# Patient Record
Sex: Female | Born: 1960 | Race: Black or African American | Hispanic: No | Smoking: Current every day smoker
Health system: Southern US, Community
[De-identification: ages and names within clinical notes are randomized; demographics above are authoritative.]

## PROBLEM LIST (undated history)

## (undated) DIAGNOSIS — I10 Essential (primary) hypertension: Secondary | ICD-10-CM

## (undated) DIAGNOSIS — I739 Peripheral vascular disease, unspecified: Secondary | ICD-10-CM

## (undated) HISTORY — PX: TUBAL LIGATION: SHX77

## (undated) HISTORY — DX: Peripheral vascular disease, unspecified: I73.9

## (undated) HISTORY — PX: APPENDECTOMY: SHX54

---

## 2006-07-29 ENCOUNTER — Emergency Department (HOSPITAL_COMMUNITY): Admission: EM | Admit: 2006-07-29 | Discharge: 2006-07-29 | Payer: Self-pay | Admitting: Emergency Medicine

## 2006-07-29 IMAGING — CR DG CHEST 2V
2 series · 2 of 2 positions shown · non-contrast
Comparison: None.

CLINICAL DATA: Body aches, loss of appetite, nausea, smoker, fever. 
 CHEST ? 2 VIEW:

[view not recorded (1 of 2)]
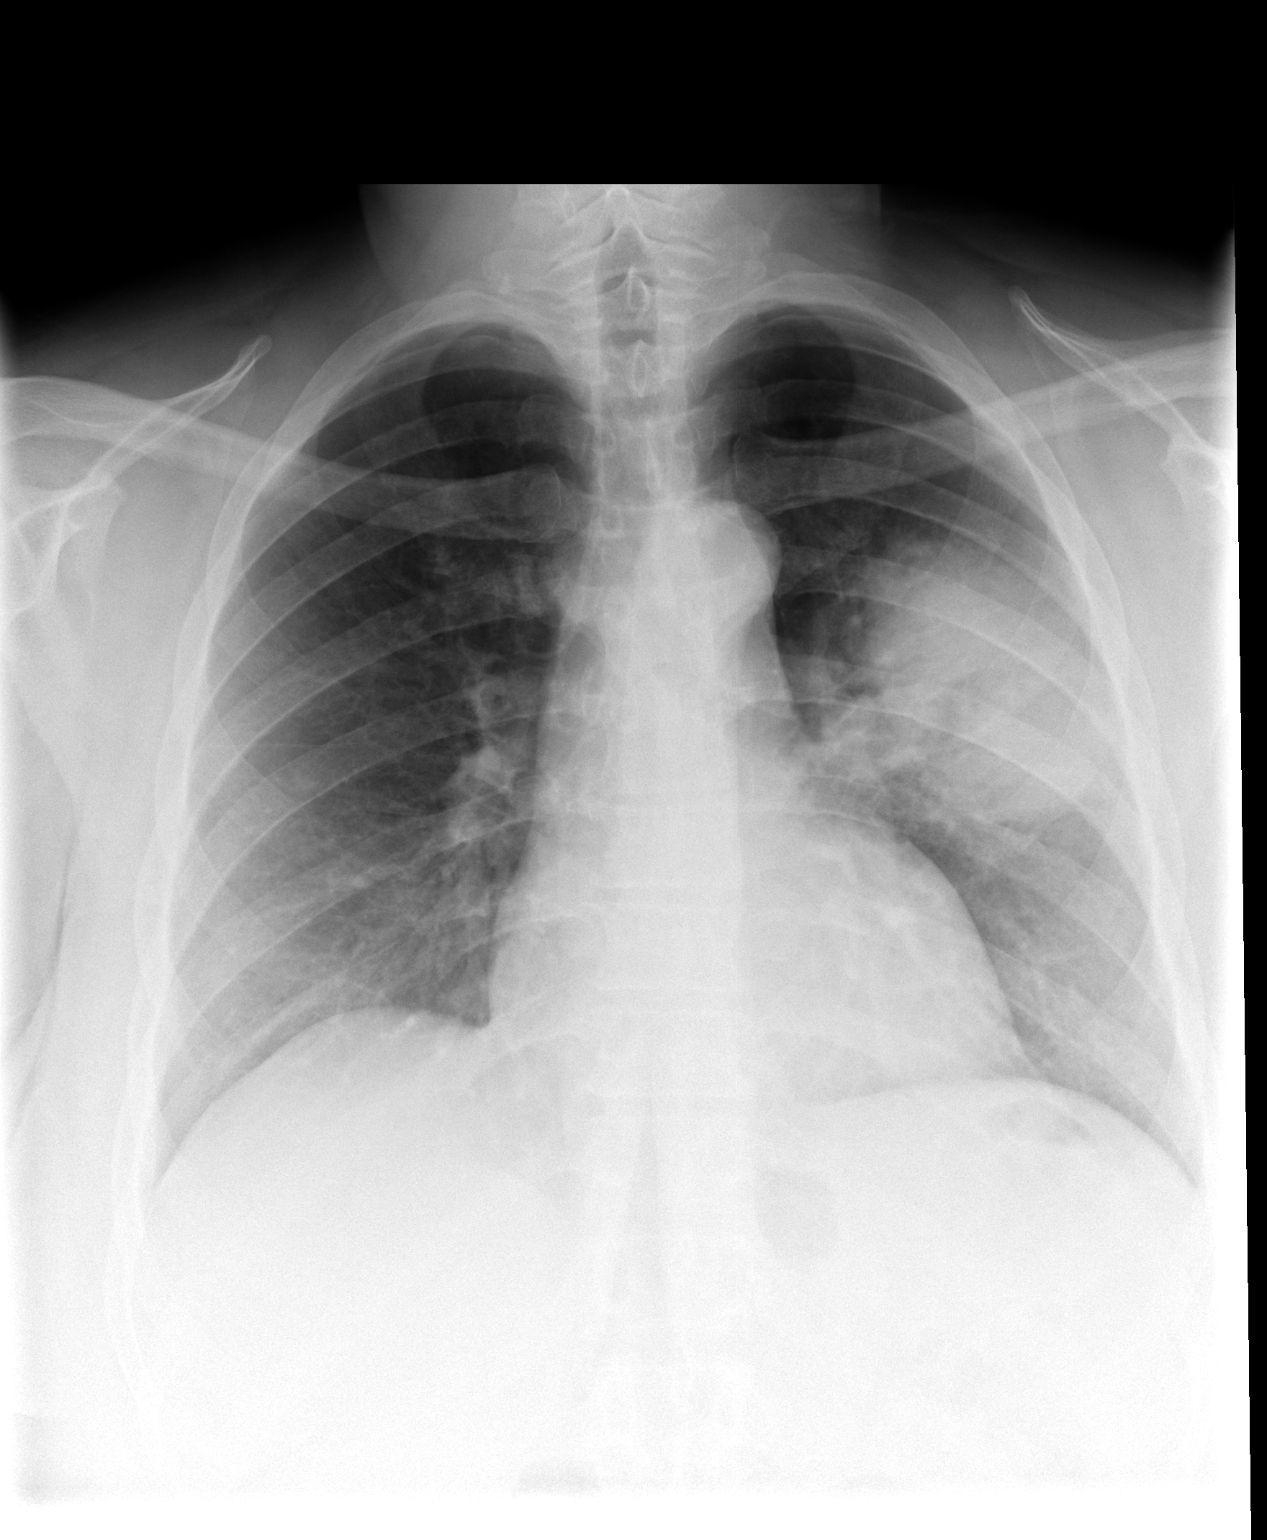

[view not recorded (2 of 2)]
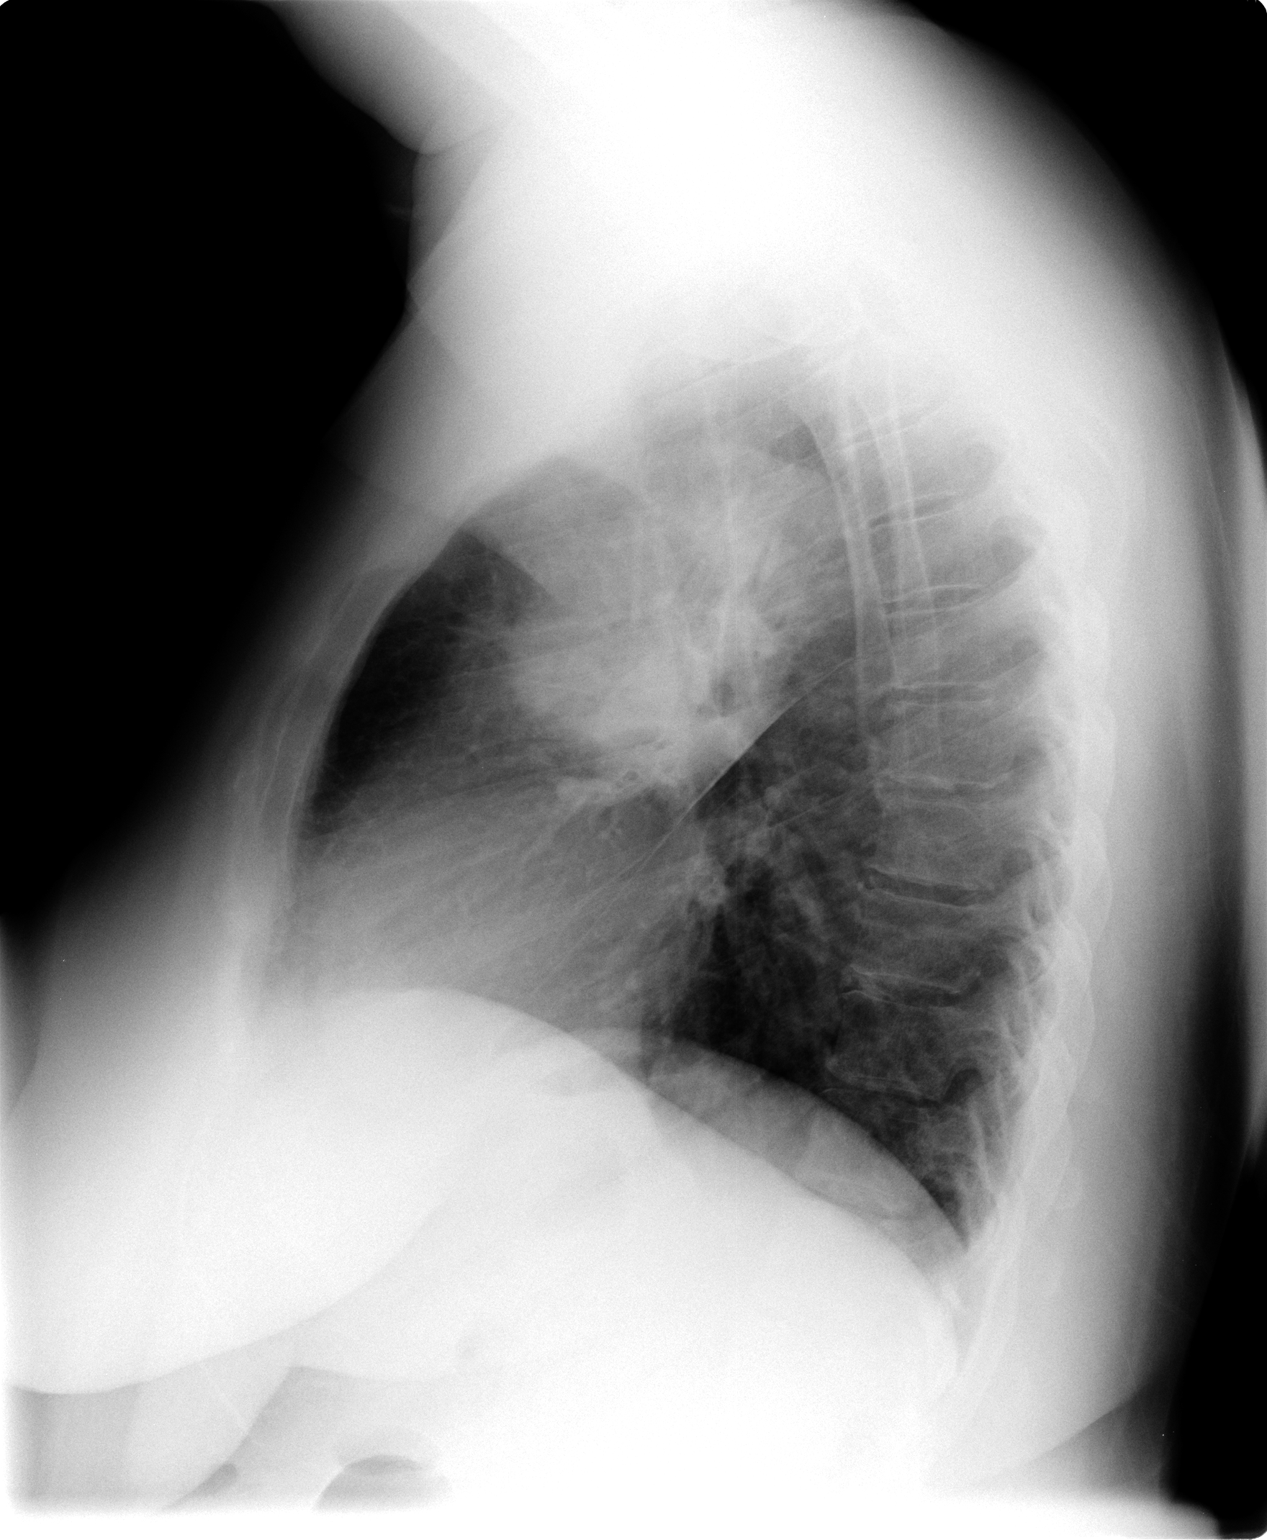

[2 of 2 positions shown; findings below may reference images not displayed]

FINDINGS: Trachea is midline.  Heart size normal.  There is a mass-like opacity in the anterior segment of the left upper lobe.  Question left hilar adenopathy.  Right lung is clear.  No pleural fluid.
IMPRESSION: Left upper lobe mass-like opacity and probable left hilar adenopathy.  Findings are worrisome for primary bronchogenic carcinoma.  Pneumonia is also a consideration.  Chest  CT would be useful in further evaluation, as clinically indicated.

## 2006-07-29 IMAGING — CT CT CHEST W/ CM
1 of 2 series · 14 of 31 positions shown, 18 images · IV contrast (omnipaque)
Comparison: Chest x-ray of [DATE].

CLINICAL DATA: 45-year-old female with a left upper lobe rounded mass-like opacity concerning for pneumonia versus lung mass.  History of fever, chills. 
CHEST CT WITH CONTRAST:
TECHNIQUE: Multidetector CT imaging of the chest was performed following the standard protocol during bolus administration of intravenous contrast.
Contrast:  80 cc Omnipaque 300.

[Series 2: chest_routine 5.0 b40f st · axial · 0.79mm/px · z∈[+968,+1204]mm · 14 of 57 slices shown, 18 images]
[im 5/57  mediastinal]
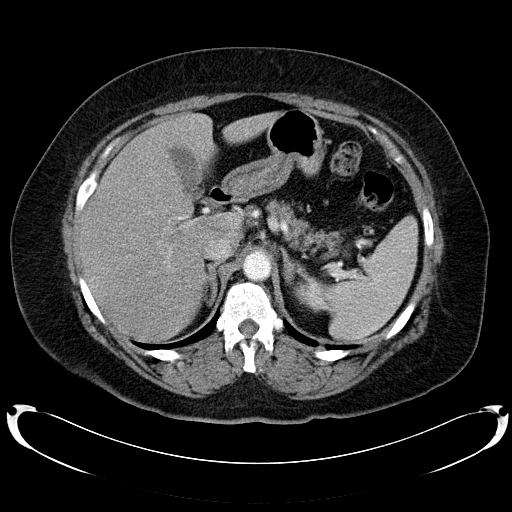
[im 5/57  lung]
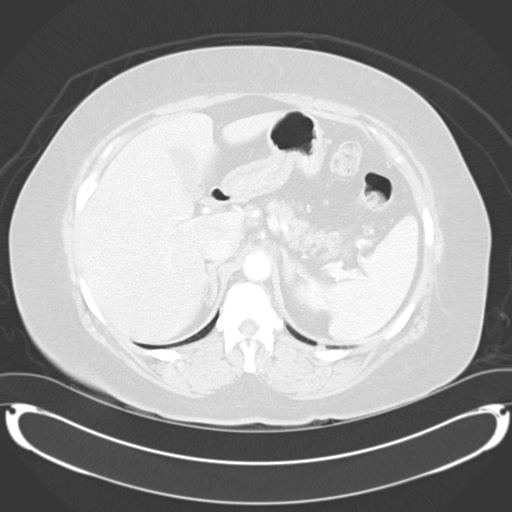
[im 9/57  lung]
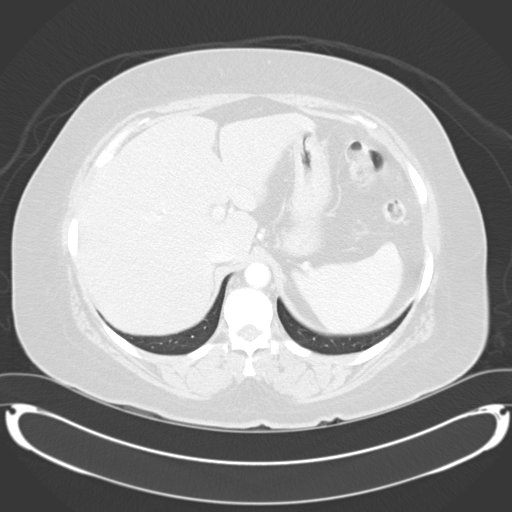
[im 13/57  lung]
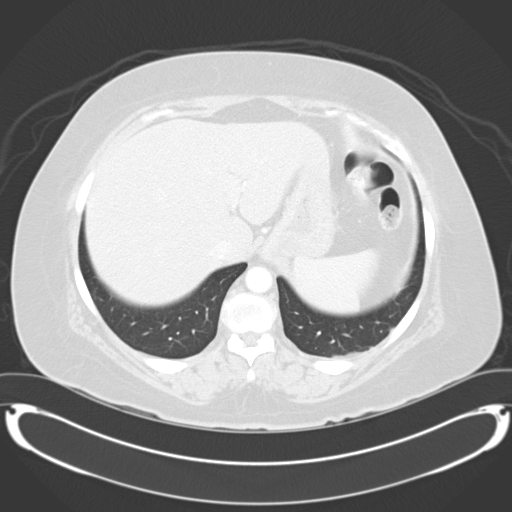
[im 18/57  lung]
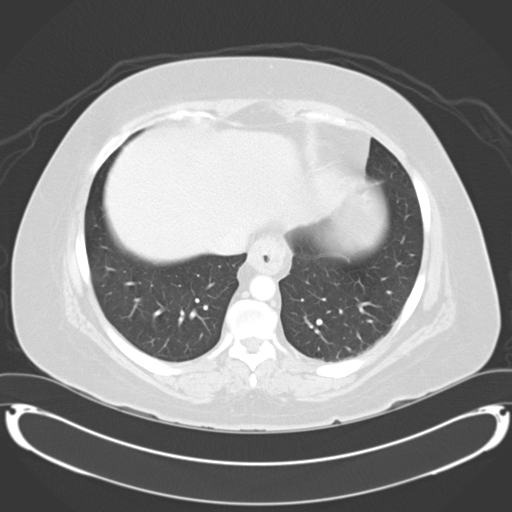
[im 22/57  mediastinal]
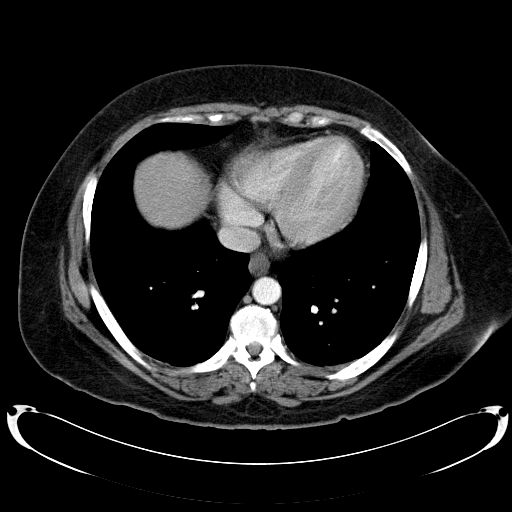
[im 22/57  lung]
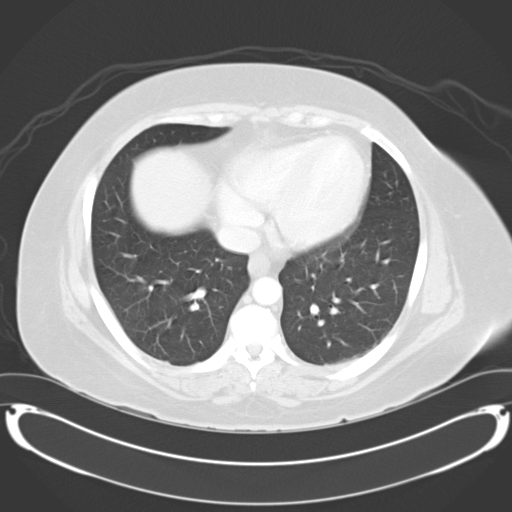
[im 26/57  lung]
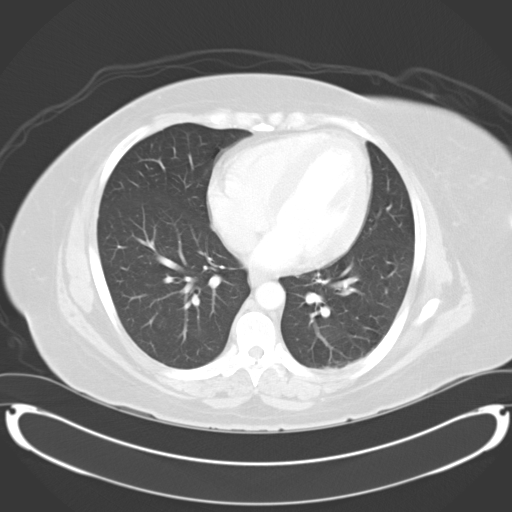
[im 27/57  lung]
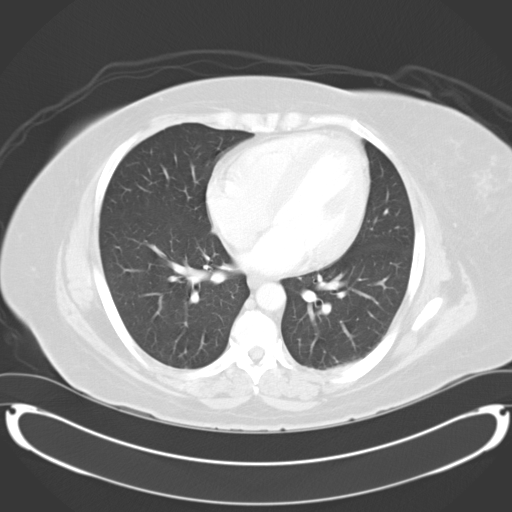
[im 29/57  lung]
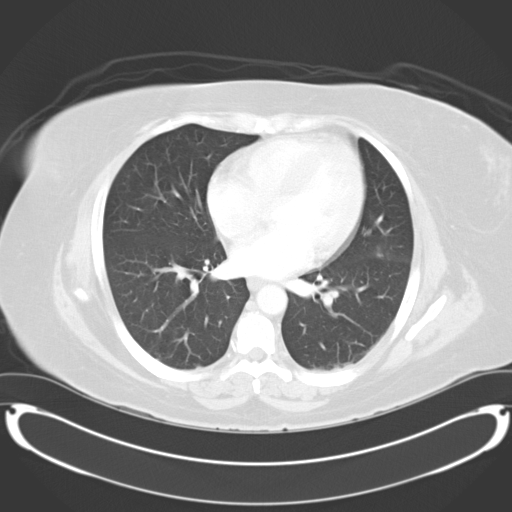
[im 31/57  mediastinal]
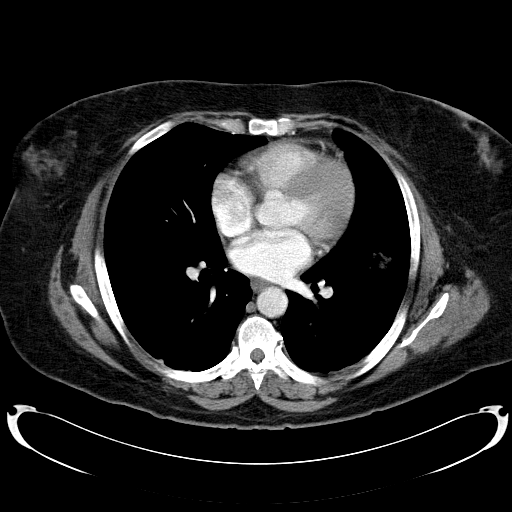
[im 31/57  lung]
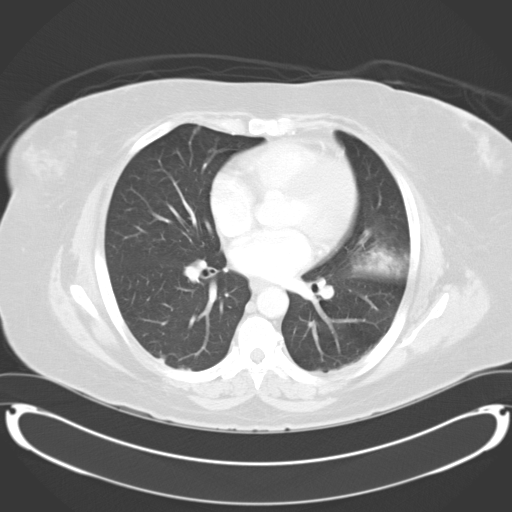
[im 35/57  lung]
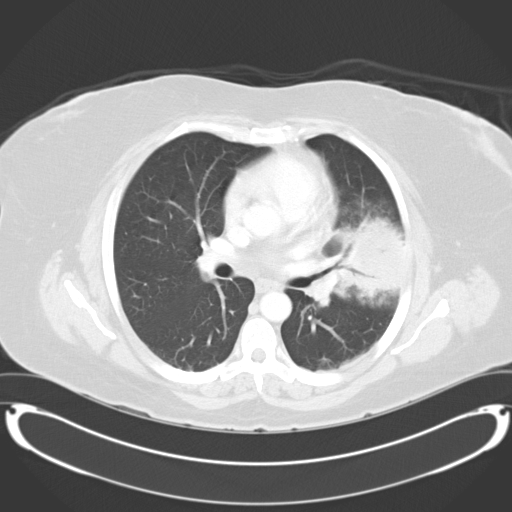
[im 39/57  lung]
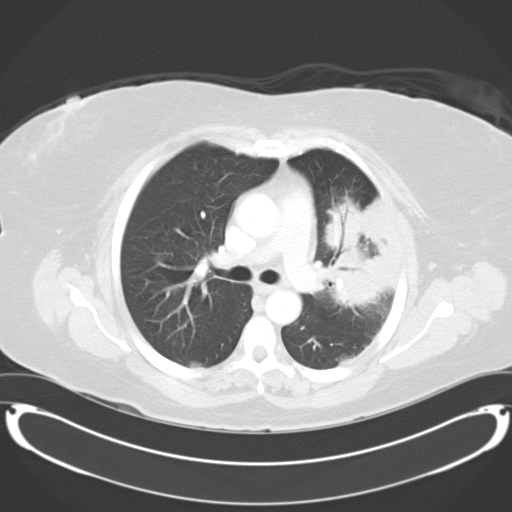
[im 44/57  lung]
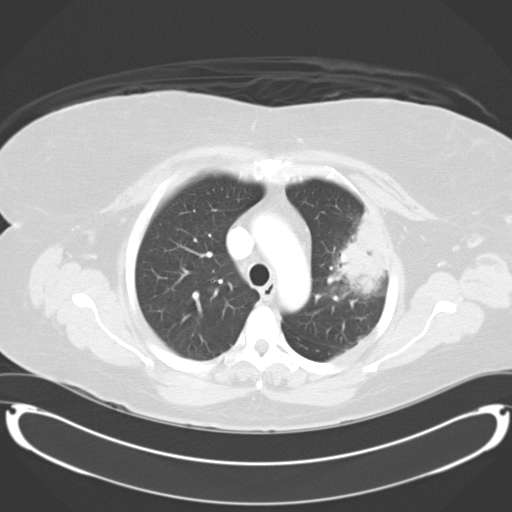
[im 48/57  mediastinal]
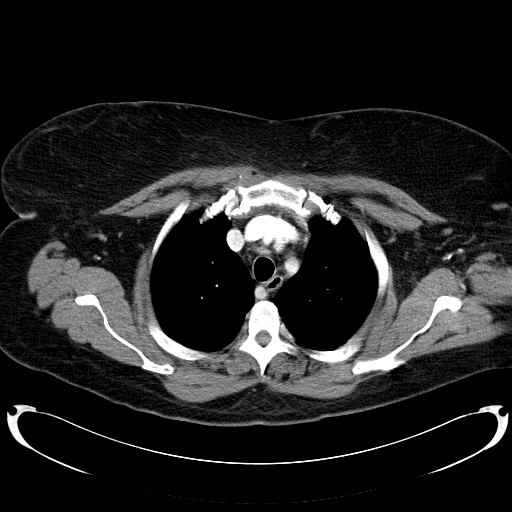
[im 48/57  lung]
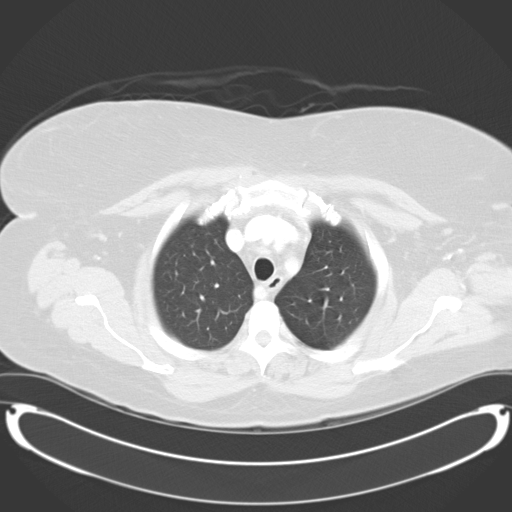
[im 52/57  lung]
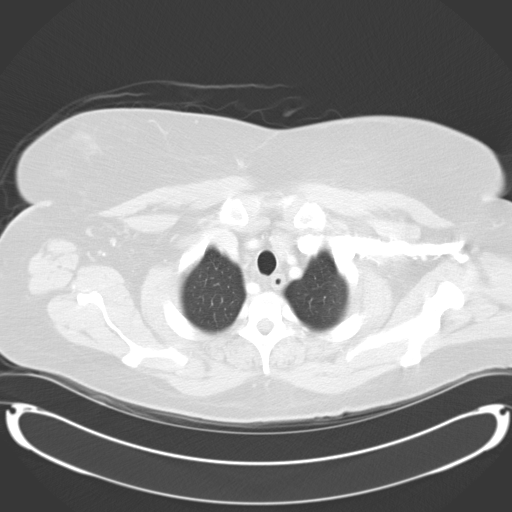

[14 of 31 positions shown; findings below may reference images not displayed]

FINDINGS: Soft tissue windows demonstrate no axillary or significant mediastinal/hilar adenopathy.  Normal heart size.  No pericardial or pleural effusion.  Small hiatal hernia at the gastroesophageal junction.  The patient has a left aortic arch with an aberrant right subclavian artery coursing posterior to the trachea and esophagus.  This is a normal variant.  Imaging of the upper abdomen demonstrates no additional acute findings.  
Lung windows demonstrate a large area of consolidative airspace disease within the left upper lobe with peripheral and central air bronchograms correlating with the plain radiographic abnormality.  The appearance is most consistent with an acute consolidative left upper lobe pneumonia.  However, followup should be performed in 6-8 weeks to document resolution and also to exclude any underlying lung mass.  Minimal subpleural lower lobe atelectasis is identified.  Also in the superior segment of the right lower lobe, there is a noncalcified 5 mm nodule, image #26, which has an indeterminate appearance.
IMPRESSION: 1. Large left upper lobe consolidative airspace opacity with air bronchograms, most consistent with acute consolidative pneumonia.  Recommend followup at 6-8 weeks to document improvement and to exclude underlying mass.
2. No chest adenopathy.
3. Left aortic arch with an aberrant right subclavian artery.  
4. Bibasilar atelectasis.  
5. Superior segment right lower lobe 5 mm noncalcified nodule.

## 2006-10-28 ENCOUNTER — Emergency Department (HOSPITAL_COMMUNITY): Admission: EM | Admit: 2006-10-28 | Discharge: 2006-10-28 | Payer: Self-pay | Admitting: Emergency Medicine

## 2007-09-20 ENCOUNTER — Emergency Department (HOSPITAL_COMMUNITY): Admission: EM | Admit: 2007-09-20 | Discharge: 2007-09-20 | Payer: Self-pay | Admitting: Emergency Medicine

## 2007-11-03 ENCOUNTER — Ambulatory Visit: Payer: Self-pay | Admitting: Family Medicine

## 2007-11-03 ENCOUNTER — Ambulatory Visit: Payer: Self-pay | Admitting: *Deleted

## 2008-01-06 ENCOUNTER — Ambulatory Visit: Payer: Self-pay | Admitting: Internal Medicine

## 2008-01-06 ENCOUNTER — Encounter: Payer: Self-pay | Admitting: Family Medicine

## 2008-01-06 LAB — CONVERTED CEMR LAB
ALT: 16 units/L (ref 0–35)
AST: 22 units/L (ref 0–37)
Albumin: 4.1 g/dL (ref 3.5–5.2)
Alkaline Phosphatase: 68 units/L (ref 39–117)
Calcium: 9.2 mg/dL (ref 8.4–10.5)
Chloride: 106 meq/L (ref 96–112)
Creatinine, Ser: 0.85 mg/dL (ref 0.40–1.20)
Eosinophils Absolute: 0 10*3/uL (ref 0.0–0.7)
Eosinophils Relative: 0 % (ref 0–5)
HDL: 40 mg/dL (ref >39)
Hemoglobin: 10.8 g/dL — ABNORMAL LOW (ref 12.0–15.0)
LDL Cholesterol: 110 mg/dL — ABNORMAL HIGH (ref 0–99)
Lymphocytes Relative: 42 % (ref 12–46)
MCHC: 29.8 g/dL — ABNORMAL LOW (ref 30.0–36.0)
Monocytes Absolute: 0.5 10*3/uL (ref 0.1–1.0)
Monocytes Relative: 11 % (ref 3–12)
Platelets: 369 10*3/uL (ref 150–400)
RDW: 16.9 % — ABNORMAL HIGH (ref 11.5–15.5)
Sodium: 139 meq/L (ref 135–145)
TSH: 1.166 microintl units/mL (ref 0.350–5.50)
Total Protein: 7.8 g/dL (ref 6.0–8.3)
VLDL: 24 mg/dL (ref 0–40)

## 2008-01-12 ENCOUNTER — Encounter: Payer: Self-pay | Admitting: Family Medicine

## 2008-01-12 LAB — CONVERTED CEMR LAB
Ferritin: 8 ng/mL — ABNORMAL LOW (ref 10–291)
Saturation Ratios: 6 % — ABNORMAL LOW (ref 20–55)

## 2008-02-18 ENCOUNTER — Emergency Department (HOSPITAL_COMMUNITY): Admission: EM | Admit: 2008-02-18 | Discharge: 2008-02-18 | Payer: Self-pay | Admitting: Emergency Medicine

## 2008-04-09 ENCOUNTER — Ambulatory Visit: Payer: Self-pay | Admitting: Internal Medicine

## 2008-04-24 ENCOUNTER — Emergency Department (HOSPITAL_COMMUNITY): Admission: EM | Admit: 2008-04-24 | Discharge: 2008-04-24 | Payer: Self-pay | Admitting: Emergency Medicine

## 2008-06-11 ENCOUNTER — Ambulatory Visit: Payer: Self-pay | Admitting: Internal Medicine

## 2009-03-22 ENCOUNTER — Emergency Department (HOSPITAL_COMMUNITY): Admission: EM | Admit: 2009-03-22 | Discharge: 2009-03-22 | Payer: Self-pay | Admitting: Emergency Medicine

## 2009-03-22 ENCOUNTER — Emergency Department (HOSPITAL_COMMUNITY): Admission: EM | Admit: 2009-03-22 | Discharge: 2009-03-22 | Payer: Self-pay | Admitting: Family Medicine

## 2009-03-22 IMAGING — CT CT ANGIO CHEST
2 of 6 series · 19 of 36 positions shown · IV contrast (APPLIED)
Comparison: Routine CT chest [DATE]

CLINICAL DATA: Right chest pain/short of breath/elevated D-dimer

CT ANGIOGRAPHY CHEST WITH CONTRAST
TECHNIQUE: Multidetector CT imaging of the chest was performed
using the standard protocol during bolus administration of
intravenous contrast. Multiplanar CT image reconstructions
including MIPs were obtained to evaluate the vascular anatomy.
Contrast: 100 ml [BY]

[Series 8: pulm embolism 1.0 b25f thins · axial · 0.70mm/px · z∈[-294,-40]mm · 18 of 285 slices shown]
[im 15/285  lung]
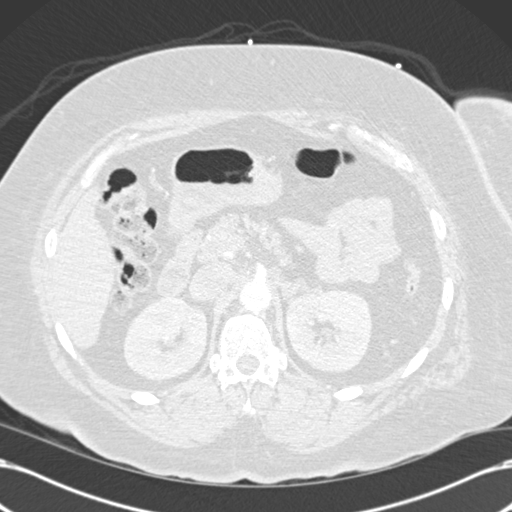
[im 29/285  mediastinal]
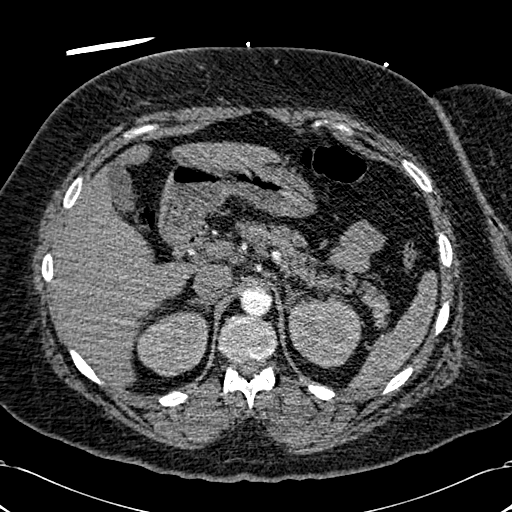
[im 43/285  lung]
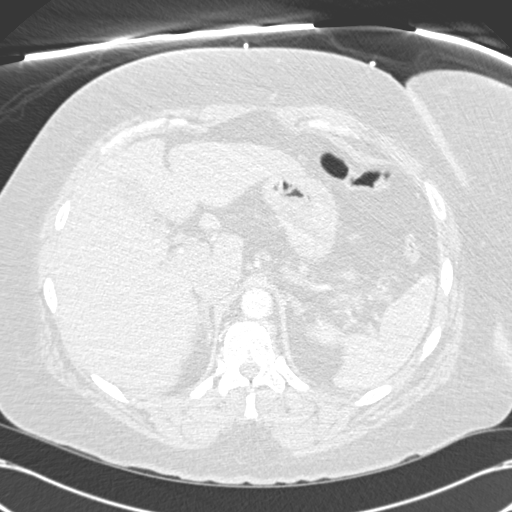
[im 57/285  mediastinal]
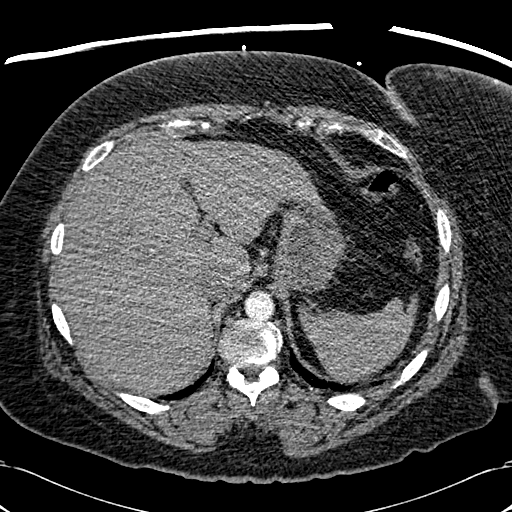
[im 72/285  lung]
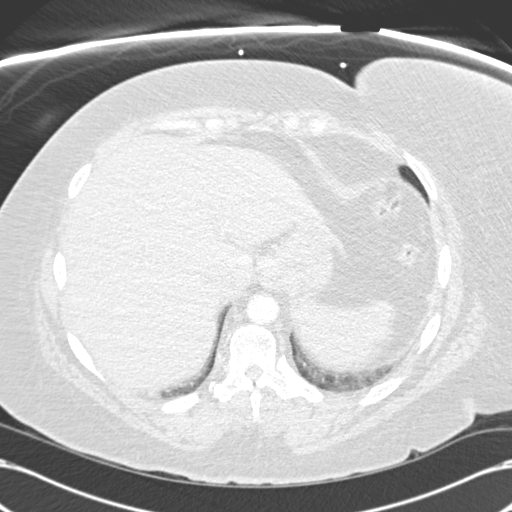
[im 86/285  mediastinal]
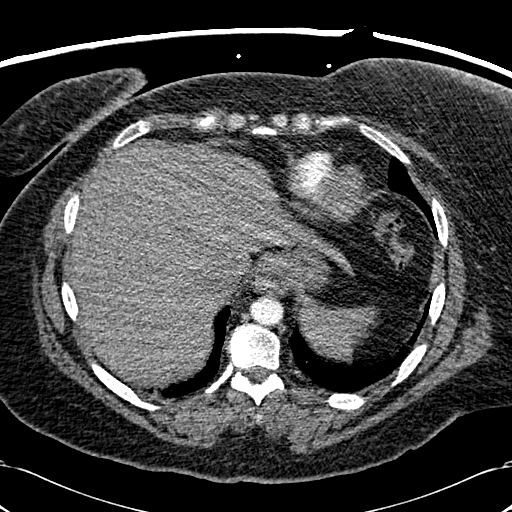
[im 100/285  lung]
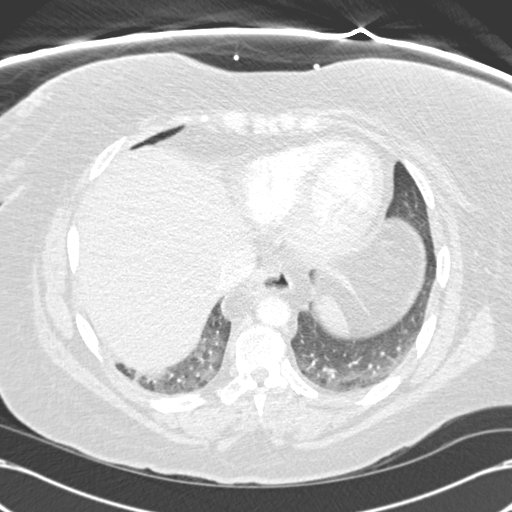
[im 114/285  mediastinal]
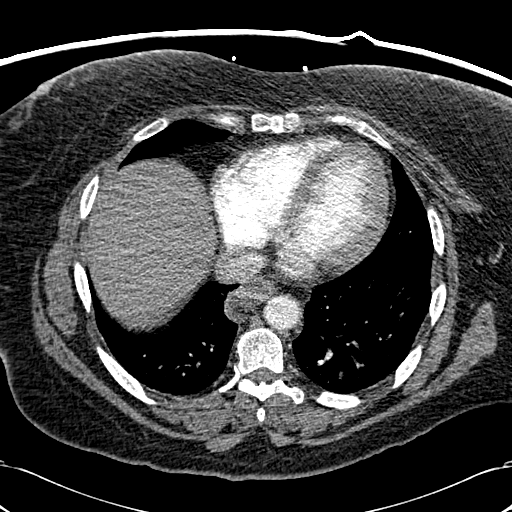
[im 128/285  lung]
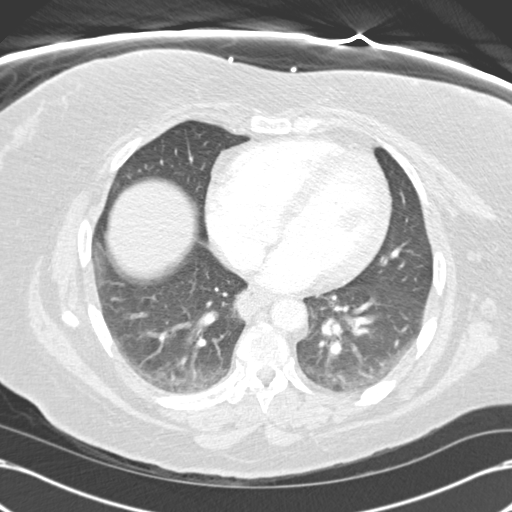
[im 157/285  mediastinal]
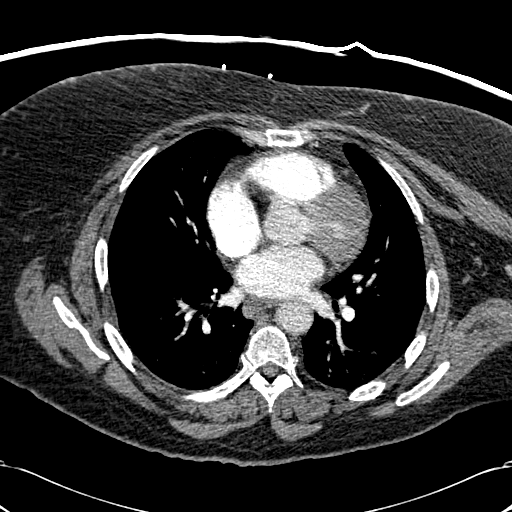
[im 171/285  lung]
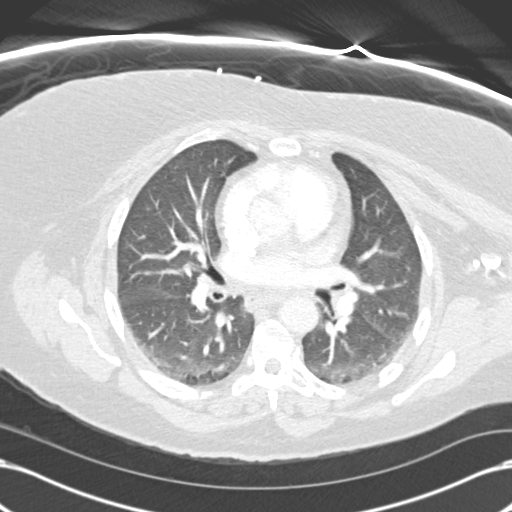
[im 185/285  mediastinal]
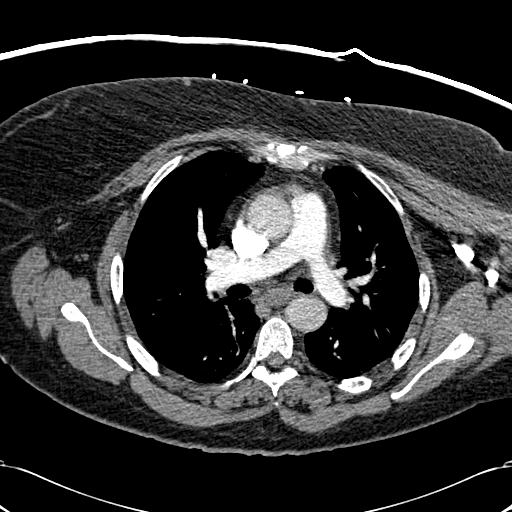
[im 199/285  lung]
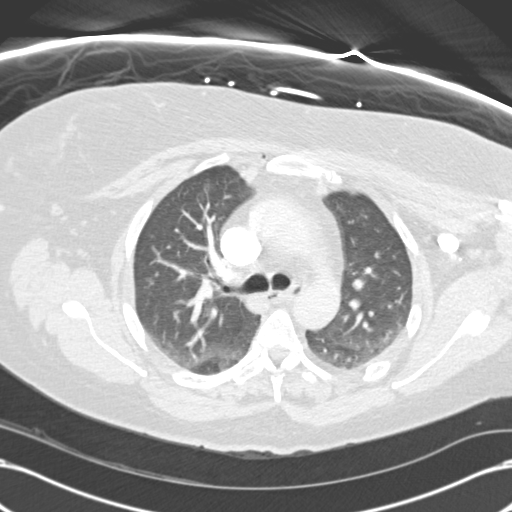
[im 214/285  mediastinal]
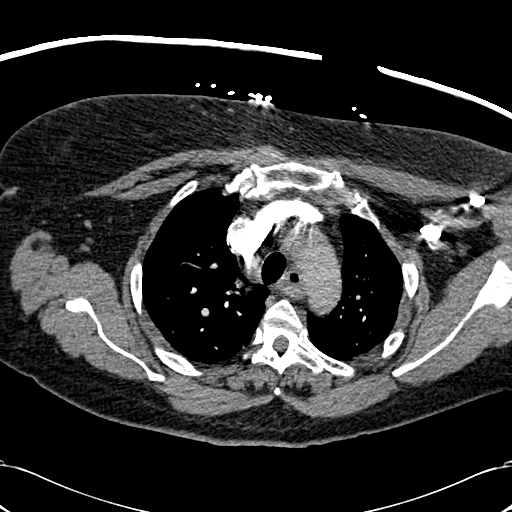
[im 228/285  lung]
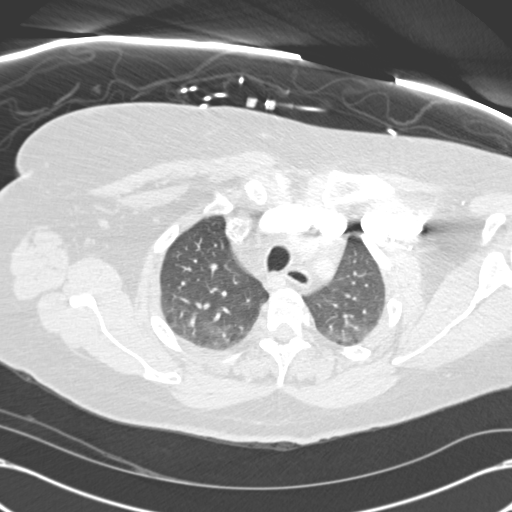
[im 242/285  mediastinal]
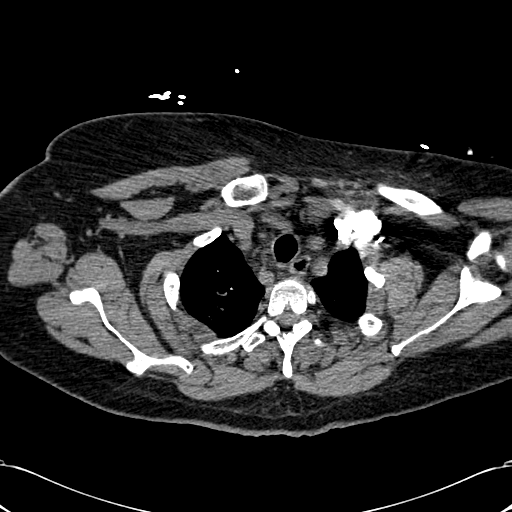
[im 256/285  lung]
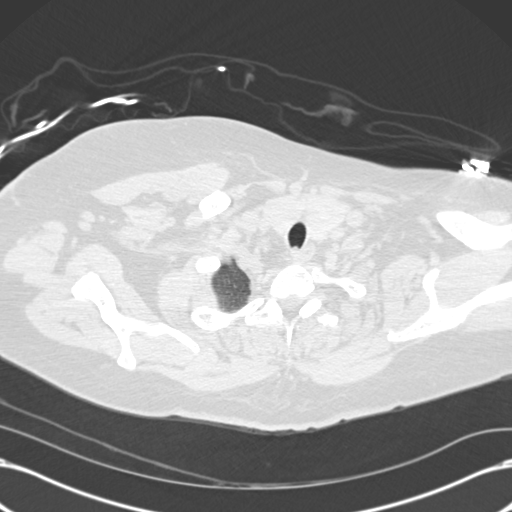
[im 270/285  mediastinal]
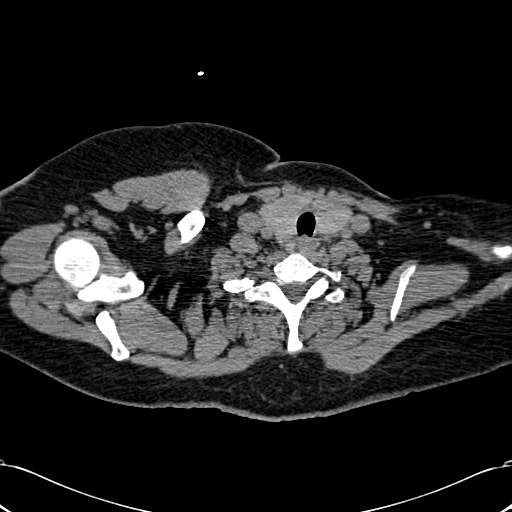

[Series 602: coronal mpr · coronal · 0.70mm/px · 1 of 68 slices shown]
[im 34/68  mediastinal]
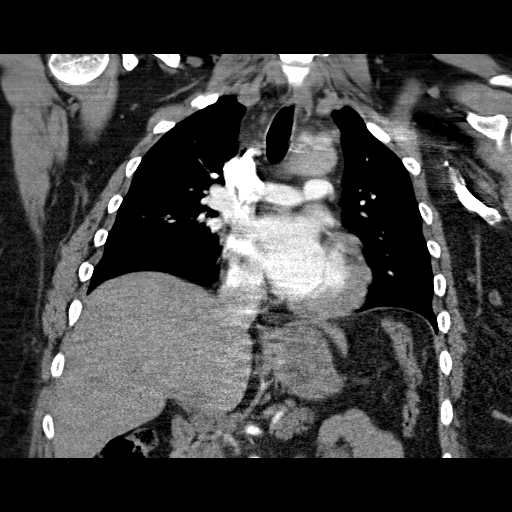

[19 of 36 positions shown; findings below may reference images not displayed]

FINDINGS: The study is degraded to some degree by breathing motion
artifact and body habitus.

I do not see any changes to suggest pulmonary embolism.  No pleural
or pericardial fluid.  No obvious pneumonia or infarction.  The
left upper lobe airspace process noted previously, has resolved.

A hiatal hernia is noted. Thoracic aorta normal.

Review of the MIP images confirms the above findings.
IMPRESSION: 1.  The study is limited technically by body habitus and breathing
motion.
2.  No evidence for pulmonary emboli or acute pulmonary disease.
3.  Hiatal hernia incidentally noted.

## 2009-03-22 IMAGING — CR DG CHEST 2V
2 series · 2 of 2 positions shown · non-contrast
Comparison: [DATE]

CLINICAL DATA: Right neck and ear pain/chest pain

CHEST - 2 VIEW

[view not recorded (1 of 2)]
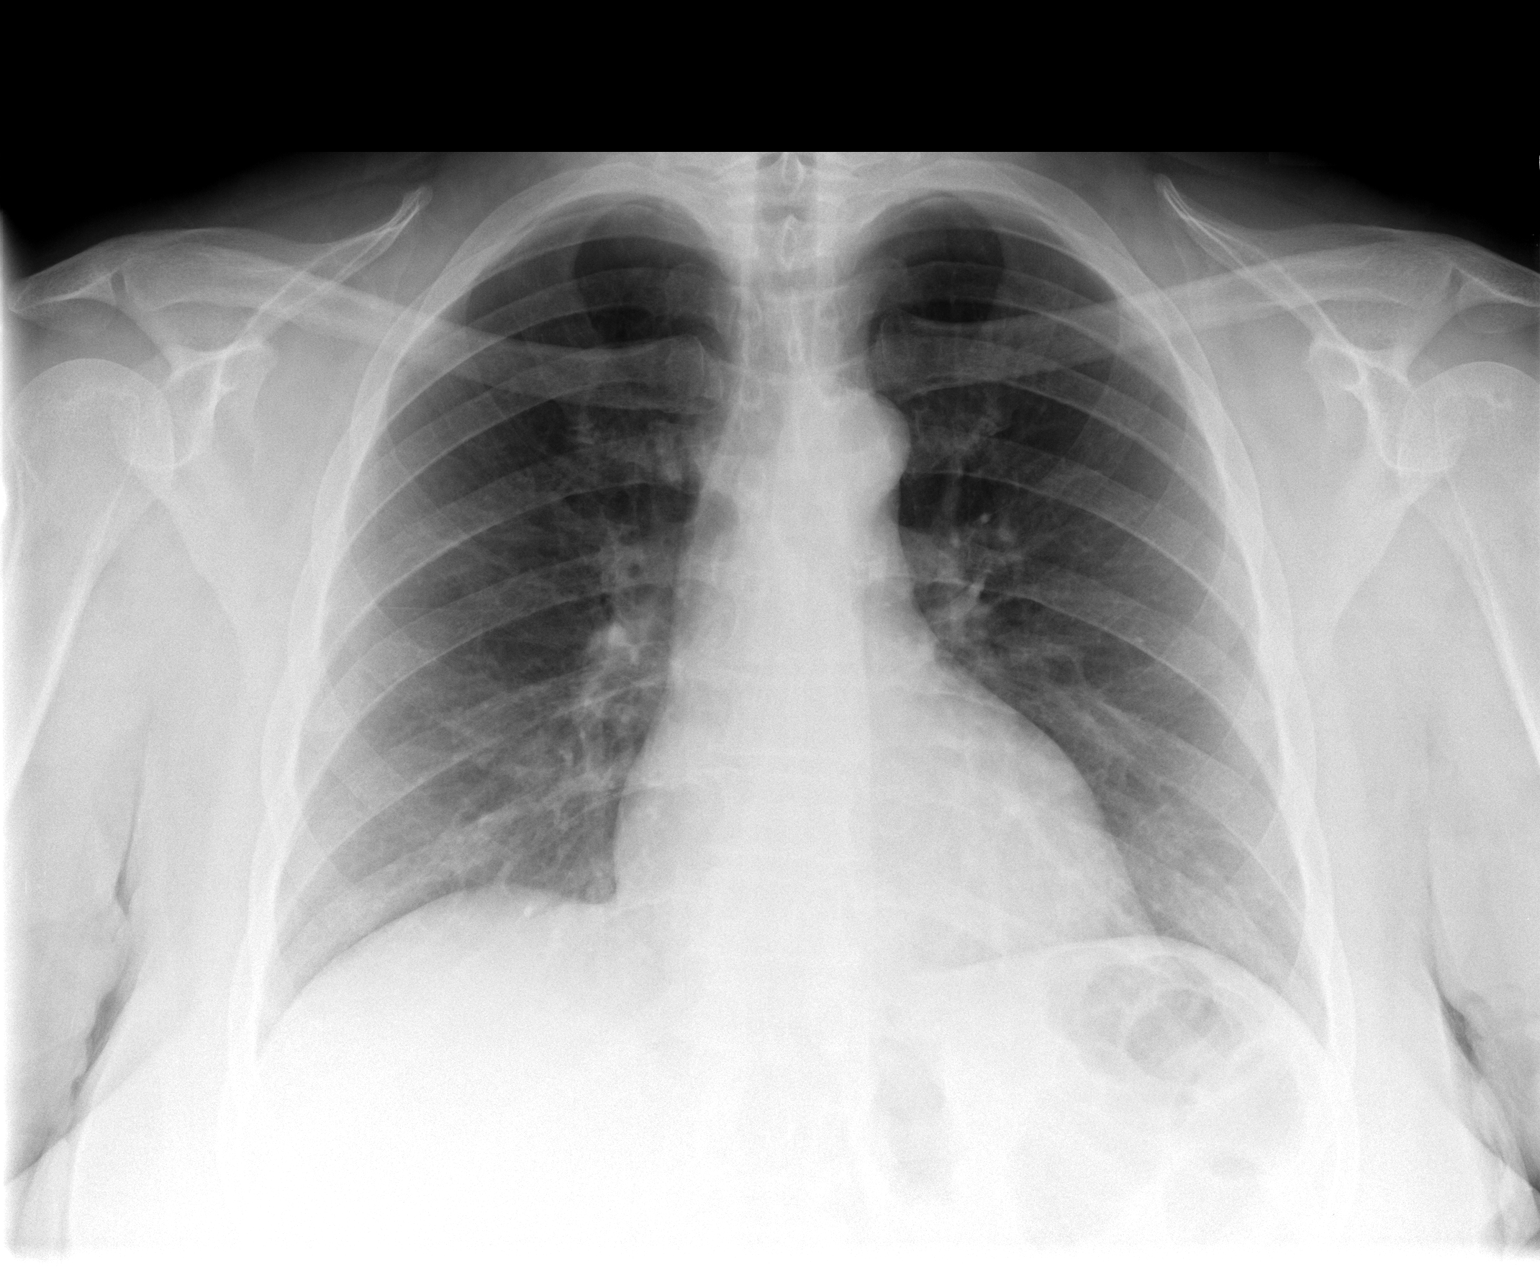

[view not recorded (2 of 2)]
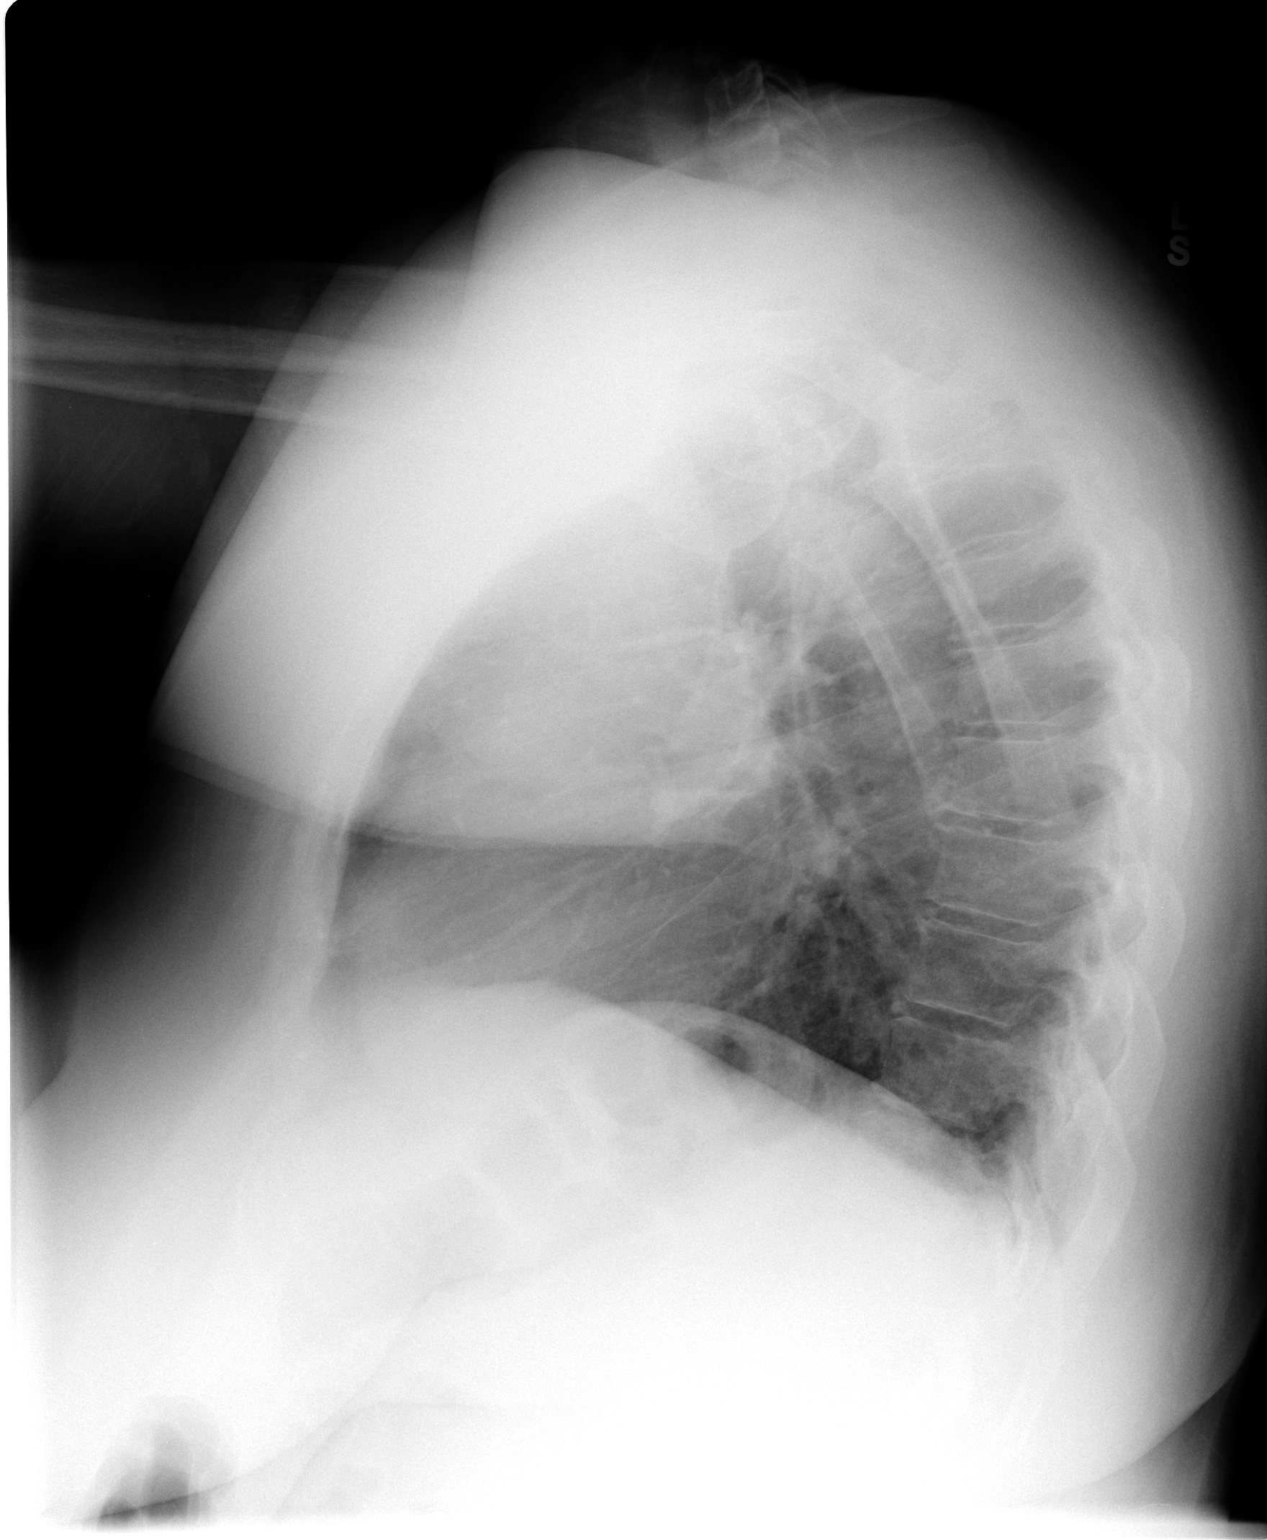

[2 of 2 positions shown; findings below may reference images not displayed]

FINDINGS: Heart and mediastinal contours normal.  Lungs currently
clear with complete resolution of left upper lobe consolidation
noted on the prior exam.

No pleural fluid or osseous lesions.
IMPRESSION: No active cardiopulmonary disease.

## 2009-04-08 ENCOUNTER — Ambulatory Visit: Payer: Self-pay | Admitting: Internal Medicine

## 2010-05-25 ENCOUNTER — Emergency Department (HOSPITAL_COMMUNITY): Admission: EM | Admit: 2010-05-25 | Discharge: 2010-05-25 | Payer: Self-pay | Admitting: Emergency Medicine

## 2010-05-28 ENCOUNTER — Emergency Department (HOSPITAL_COMMUNITY): Admission: EM | Admit: 2010-05-28 | Discharge: 2010-05-28 | Payer: Self-pay | Admitting: Emergency Medicine

## 2010-06-01 ENCOUNTER — Emergency Department (HOSPITAL_COMMUNITY): Admission: EM | Admit: 2010-06-01 | Discharge: 2010-06-01 | Payer: Self-pay | Admitting: Emergency Medicine

## 2010-10-03 LAB — URINALYSIS, ROUTINE W REFLEX MICROSCOPIC
Hgb urine dipstick: NEGATIVE
Nitrite: NEGATIVE
Specific Gravity, Urine: 1.03 (ref 1.005–1.030)

## 2010-10-03 LAB — URINE MICROSCOPIC-ADD ON

## 2010-10-28 LAB — POCT I-STAT, CHEM 8
Calcium, Ion: 1.11 mmol/L — ABNORMAL LOW (ref 1.12–1.32)
Creatinine, Ser: 0.9 mg/dL (ref 0.4–1.2)
Hemoglobin: 12.2 g/dL (ref 12.0–15.0)
TCO2: 21 mmol/L (ref 0–100)

## 2010-10-28 LAB — DIFFERENTIAL
Basophils Absolute: 0 10*3/uL (ref 0.0–0.1)
Eosinophils Absolute: 0 10*3/uL (ref 0.0–0.7)
Lymphocytes Relative: 18 % (ref 12–46)
Lymphs Abs: 1.5 10*3/uL (ref 0.7–4.0)
Neutrophils Relative %: 70 % (ref 43–77)

## 2010-10-28 LAB — CBC
Hemoglobin: 10.5 g/dL — ABNORMAL LOW (ref 12.0–15.0)
MCV: 89.6 fL (ref 78.0–100.0)
Platelets: 282 10*3/uL (ref 150–400)
RBC: 3.54 MIL/uL — ABNORMAL LOW (ref 3.87–5.11)

## 2010-10-28 LAB — D-DIMER, QUANTITATIVE: D-Dimer, Quant: 0.74 ug/mL-FEU — ABNORMAL HIGH (ref 0.00–0.48)

## 2011-02-25 ENCOUNTER — Emergency Department (HOSPITAL_COMMUNITY)
Admission: EM | Admit: 2011-02-25 | Discharge: 2011-02-25 | Disposition: A | Discharge status: Home / Self Care | Payer: Medicaid Other | Attending: Emergency Medicine | Admitting: Emergency Medicine

## 2011-02-25 DIAGNOSIS — I10 Essential (primary) hypertension: Secondary | ICD-10-CM | POA: Insufficient documentation

## 2011-02-25 DIAGNOSIS — J329 Chronic sinusitis, unspecified: Secondary | ICD-10-CM | POA: Insufficient documentation

## 2011-02-25 LAB — GLUCOSE, CAPILLARY: Glucose-Capillary: 105 mg/dL — ABNORMAL HIGH (ref 70–99)

## 2011-02-25 LAB — URINALYSIS, ROUTINE W REFLEX MICROSCOPIC
Glucose, UA: NEGATIVE mg/dL
Hgb urine dipstick: NEGATIVE
Leukocytes, UA: NEGATIVE
pH: 6 (ref 5.0–8.0)

## 2011-02-25 LAB — POCT I-STAT, CHEM 8
BUN: 14 mg/dL (ref 6–23)
Calcium, Ion: 1.11 mmol/L — ABNORMAL LOW (ref 1.12–1.32)
Chloride: 112 meq/L (ref 96–112)
Creatinine, Ser: 1.1 mg/dL (ref 0.50–1.10)
Glucose, Bld: 75 mg/dL (ref 70–99)
HCT: 38 % (ref 36.0–46.0)
Hemoglobin: 12.9 g/dL (ref 12.0–15.0)
Potassium: 3.8 meq/L (ref 3.5–5.1)
Sodium: 142 meq/L (ref 135–145)
TCO2: 22 mmol/L (ref 0–100)

## 2011-02-25 LAB — POCT PREGNANCY, URINE: Preg Test, Ur: NEGATIVE

## 2011-04-20 LAB — URINALYSIS, ROUTINE W REFLEX MICROSCOPIC
Bilirubin Urine: NEGATIVE
Hgb urine dipstick: NEGATIVE
Nitrite: NEGATIVE
Urobilinogen, UA: 0.2

## 2011-04-20 LAB — URINE CULTURE: Colony Count: 8000

## 2011-04-23 LAB — GLUCOSE, CAPILLARY: Glucose-Capillary: 86

## 2011-10-06 ENCOUNTER — Emergency Department (HOSPITAL_COMMUNITY): Payer: Self-pay

## 2011-10-06 ENCOUNTER — Emergency Department (HOSPITAL_COMMUNITY)
Admission: EM | Admit: 2011-10-06 | Discharge: 2011-10-06 | Disposition: A | Discharge status: Home / Self Care | Payer: Self-pay | Attending: Emergency Medicine | Admitting: Emergency Medicine

## 2011-10-06 ENCOUNTER — Encounter (HOSPITAL_COMMUNITY): Payer: Self-pay | Admitting: *Deleted

## 2011-10-06 DIAGNOSIS — J329 Chronic sinusitis, unspecified: Secondary | ICD-10-CM | POA: Insufficient documentation

## 2011-10-06 DIAGNOSIS — F17200 Nicotine dependence, unspecified, uncomplicated: Secondary | ICD-10-CM | POA: Insufficient documentation

## 2011-10-06 DIAGNOSIS — Z885 Allergy status to narcotic agent status: Secondary | ICD-10-CM | POA: Insufficient documentation

## 2011-10-06 DIAGNOSIS — I10 Essential (primary) hypertension: Secondary | ICD-10-CM | POA: Insufficient documentation

## 2011-10-06 DIAGNOSIS — Z9089 Acquired absence of other organs: Secondary | ICD-10-CM | POA: Insufficient documentation

## 2011-10-06 DIAGNOSIS — Z9119 Patient's noncompliance with other medical treatment and regimen: Secondary | ICD-10-CM | POA: Insufficient documentation

## 2011-10-06 DIAGNOSIS — Z91199 Patient's noncompliance with other medical treatment and regimen due to unspecified reason: Secondary | ICD-10-CM | POA: Insufficient documentation

## 2011-10-06 HISTORY — DX: Essential (primary) hypertension: I10

## 2011-10-06 IMAGING — CR DG CHEST 2V
2 series · 2 of 2 positions shown · non-contrast
Comparison: Chest radiograph [DATE]

CLINICAL DATA: Nasal congestion and shortness of breath.

CHEST - 2 VIEW

[w chest pa]
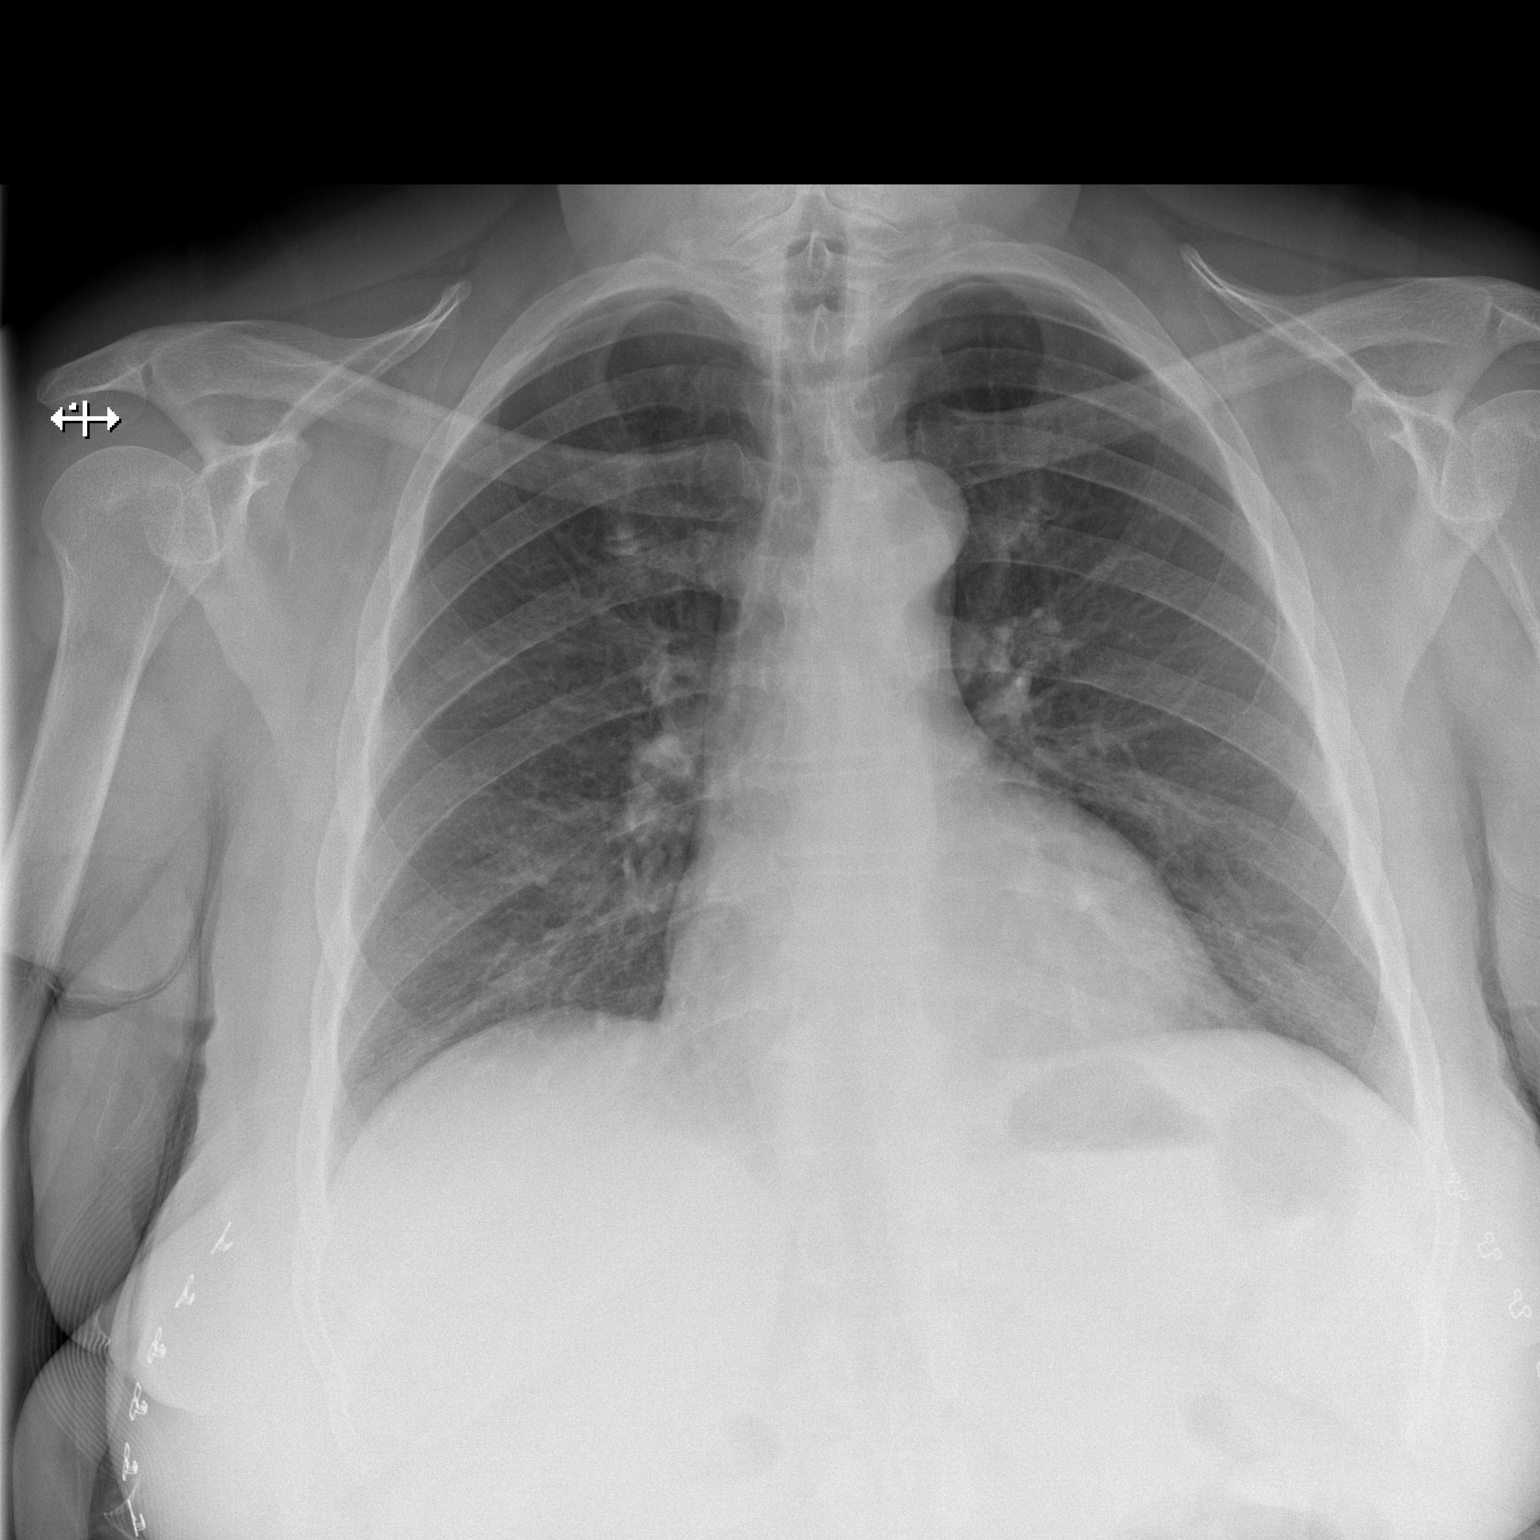

[w chest lat]
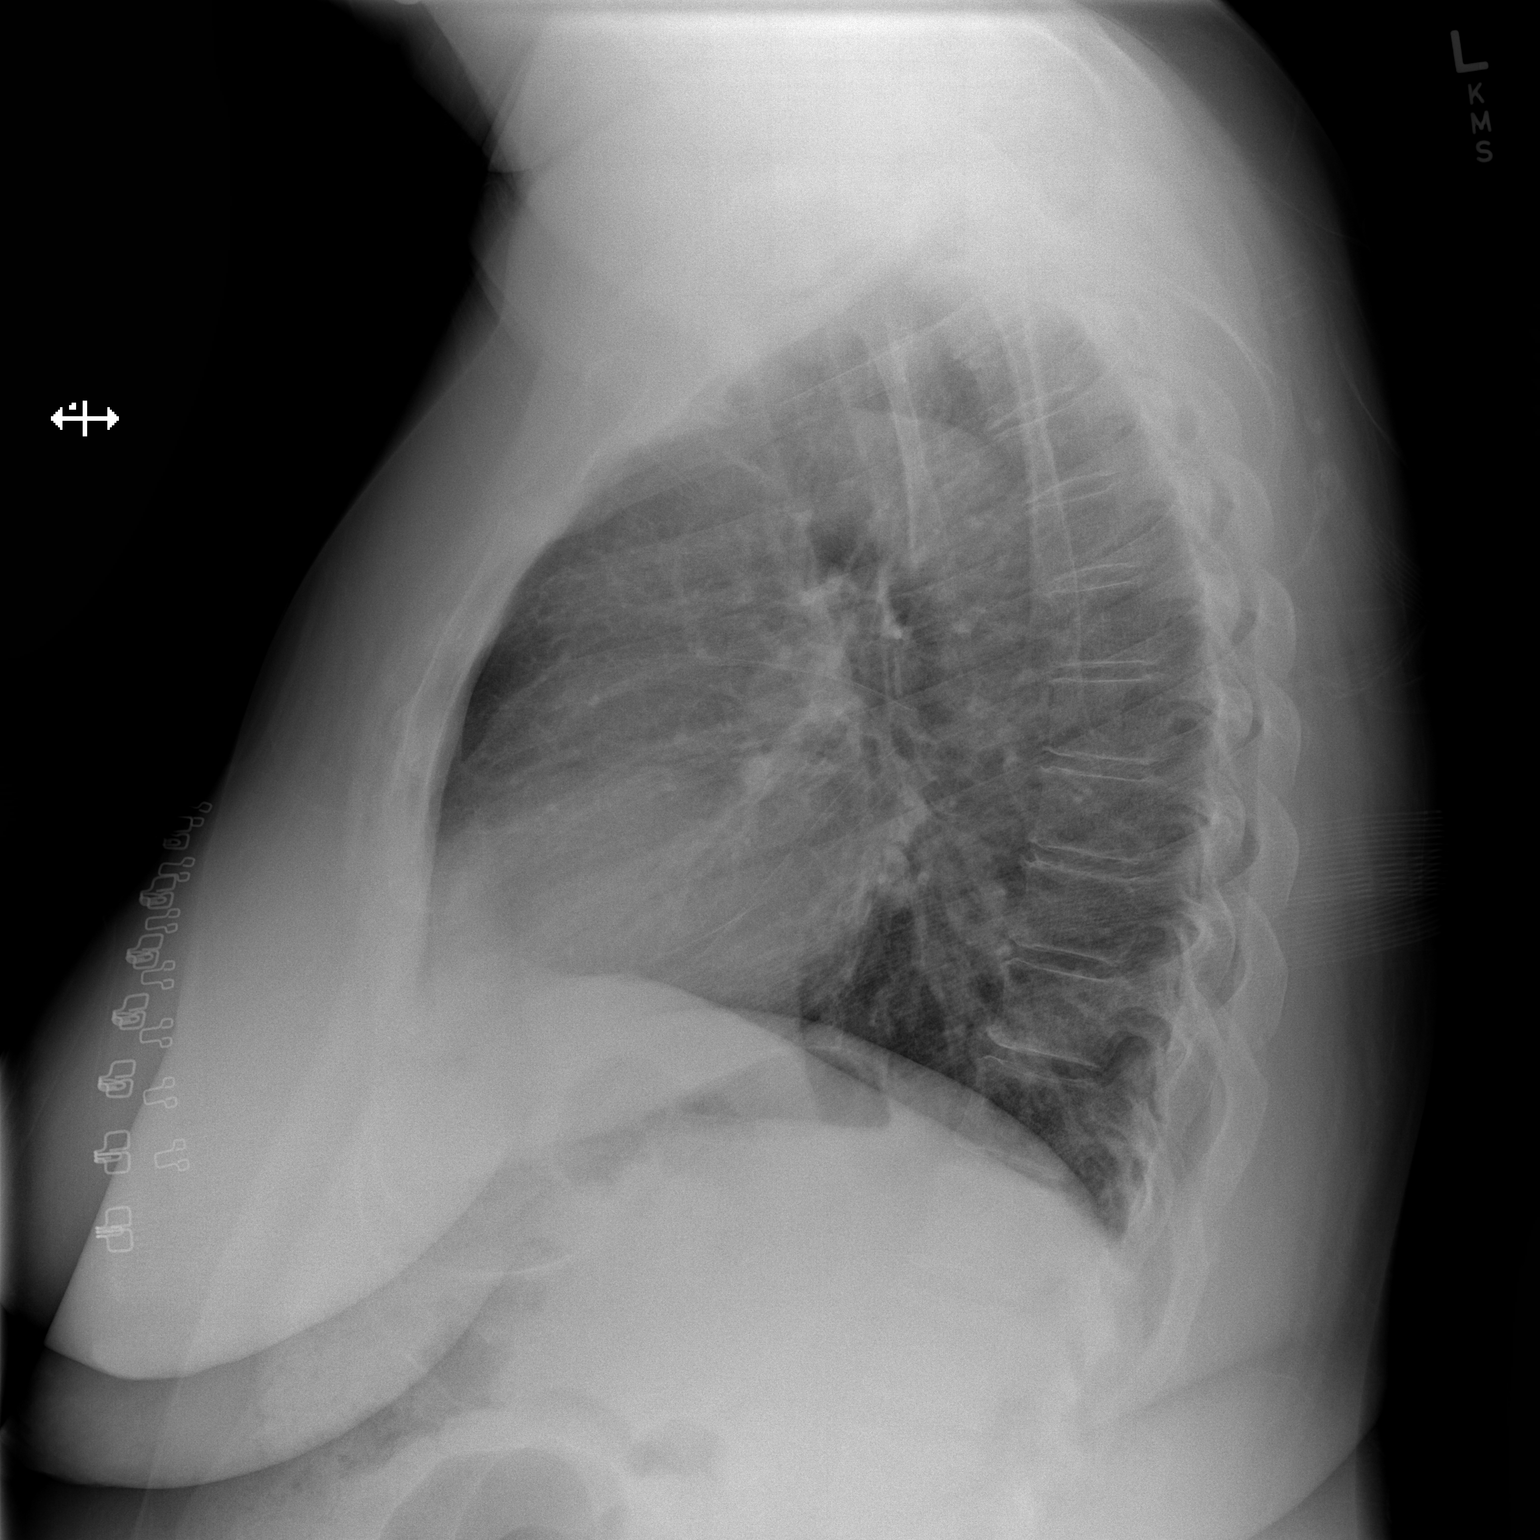

[2 of 2 positions shown; findings below may reference images not displayed]

FINDINGS: Heart, mediastinal, and hilar contours are normal and
stable.  Normal pulmonary vascularity.  The lungs are clear.  No
pleural effusion or pneumothorax.  No acute bony abnormality.
IMPRESSION: No acute cardiopulmonary disease.

## 2011-10-06 MED ORDER — AMOXICILLIN-POT CLAVULANATE 875-125 MG PO TABS: 1.0000 | ORAL_TABLET | Freq: Two times a day (BID) | ORAL | Status: AC

## 2011-10-06 MED ORDER — LISINOPRIL 20 MG PO TABS: 20.0000 mg | ORAL_TABLET | Freq: Every day | ORAL | Status: DC

## 2011-10-06 NOTE — ED Provider Notes (Signed)
History     CSN: [id number]  Arrival date & time 10/06/11  1851   First MD Initiated Contact with Patient 10/06/11 1901      Chief Complaint  Patient presents with  . Nasal Congestion  . Headache  . Cough  . Shortness of Breath  . Chills    (Consider location/radiation/quality/duration/timing/severity/associated sxs/prior treatment) HPI History provided by pt.   Pt has had nasal congestion and intermittent sinus pressure x 4 days.  Associated w/ chills, fatigue, cough that occurs mostly at night while in bed and SOB.  SOB is non-exertional and seems to occur most when she first wakes up in the am and when her sinus pressure is severe.  Denies CP, abd pain and N/V/D.  Her son recently diagnosed w/ a sinus infection.  Pt also requests a refill of her lisinopril.  Last dose was one month ago because she was unable to afford it, but now has medicaid.  She would like to be screened for diabetes d/t fatigue she has experienced this week.  Strong FH and mother died of complications of diabetes.    Past Medical History  Diagnosis Date  . Hypertension     Past Surgical History  Procedure Date  . Appendectomy   . Cesarean section     History reviewed. No pertinent family history.  History  Substance Use Topics  . Smoking status: Current Everyday Smoker -- 0.5 packs/day    Types: Cigarettes  . Smokeless tobacco: Never Used  . Alcohol Use: No    OB History    Grav Para Term Preterm Abortions TAB SAB Ect Mult Living                  Review of Systems  All other systems reviewed and are negative.    Allergies  Codeine and Food  Home Medications  No current outpatient prescriptions on file.  BP 173/78  Pulse 73  Temp(Src) 98.4 F (36.9 C) (Oral)  Resp 20  SpO2 100%  Physical Exam  Nursing note and vitals reviewed. Constitutional: She is oriented to person, place, and time. She appears well-developed and well-nourished. No distress.  HENT:  Head: No trismus in  the jaw.  Right Ear: Tympanic membrane, external ear and ear canal normal.  Left Ear: Tympanic membrane, external ear and ear canal normal.  Mouth/Throat: Uvula is midline, oropharynx is clear and moist and mucous membranes are normal. No oropharyngeal exudate, posterior oropharyngeal edema or posterior oropharyngeal erythema.       Ethmoid, maxillary and frontal sinus ttp  Eyes:       Normal appearance  Neck: Normal range of motion. Neck supple.  Cardiovascular: Normal rate and regular rhythm.   Pulmonary/Chest: Effort normal and breath sounds normal. No respiratory distress.  Abdominal: Soft. Bowel sounds are normal. She exhibits no distension. There is no tenderness.  Musculoskeletal: Normal range of motion.  Lymphadenopathy:    She has no cervical adenopathy.  Neurological: She is alert and oriented to person, place, and time.  Skin: Skin is warm and dry. No rash noted.  Psychiatric: She has a normal mood and affect. Her behavior is normal.    ED Course  Procedures (including critical care time)  Labs Reviewed  GLUCOSE, CAPILLARY - Abnormal; Notable for the following:    Glucose-Capillary 113 (*)    All other components within normal limits   Dg Chest 2 View  10/06/2011  *RADIOLOGY REPORT*  Clinical Data: Nasal congestion and shortness of breath.  CHEST - 2 VIEW  Comparison: Chest radiograph 03/22/2009  Findings: Heart, mediastinal, and hilar contours are normal and stable.  Normal pulmonary vascularity.  The lungs are clear.  No pleural effusion or pneumothorax.  No acute bony abnormality.  IMPRESSION: No acute cardiopulmonary disease.  Original Report Authenticated By: Britta Mccreedy, M.D.     1. Sinusitis       MDM  51yo F w/ h/o HTN for which she is non-compliant w/ lisinopril, presents w/ URI sx x 4 days.  Has experienced vague SOB.  On exam, afebrile, hypertensive but VS otherwise w/in nml limits, no respiratory distress, lungs clear, sinus tenderness.  Suspect viral  acute sinusitis and cough secondary to post-nasal drip.  CXR ordered d/t c/o SOB and is pending.  Cbg has been ordered at patient's request.  7:41 PM   CXR neg.  Discussed results w/ pt.  Will send home w/ augmentin for delayed therapy of sinusitis.  Risks vs. Benefits of abx discussed.  Prescribed a refill of her lisinopril.  She has her first appt w/ new PCP in 2 weeks.  Return precautions discussed.         Otilio Miu, [place] 10/06/11 2107

## 2011-10-06 NOTE — ED Notes (Signed)
Pt from home with reports of "sinus headache", cough, chills, shortness of breath and nasal congestion that started on Tuesday. Pt also reports not being able to afford BP meds and has not taken for about a month, pt also endorses feeling "off balance" as well as blurry vision "off and on" for about a week.

## 2011-10-06 NOTE — Discharge Instructions (Signed)
Take antibiotic as prescribed if your symptoms have not started to improve by Tuesday morning.   You can try saline nasal spray for congestion as well.  Take lisinopril as prescribed.  Follow up with your new doctor as scheduled.  You may return to the ER if you symptoms worsen or you have any other concerns. Sinusitis Sinuses are air pockets within the bones of your face. The growth of bacteria within a sinus leads to infection. The infection prevents the sinuses from draining. This infection is called sinusitis. SYMPTOMS  There will be different areas of pain depending on which sinuses have become infected.  The maxillary sinuses often produce pain beneath the eyes.   Frontal sinusitis may cause pain in the middle of the forehead and above the eyes.  Other problems (symptoms) include:  Toothaches.   Colored, pus-like (purulent) drainage from the nose.   Swelling, warmth, and tenderness over the sinus areas may be signs of infection.  TREATMENT  Sinusitis is most often determined by an exam.X-rays may be taken. If x-rays have been taken, make sure you obtain your results or find out how you are to obtain them. Your caregiver may give you medications (antibiotics). These are medications that will help kill the bacteria causing the infection. You may also be given a medication (decongestant) that helps to reduce sinus swelling.  HOME CARE INSTRUCTIONS   Only take over-the-counter or prescription medicines for pain, discomfort, or fever as directed by your caregiver.   Drink extra fluids. Fluids help thin the mucus so your sinuses can drain more easily.   Applying either moist heat or ice packs to the sinus areas may help relieve discomfort.   Use saline nasal sprays to help moisten your sinuses. The sprays can be found at your local drugstore.  SEEK IMMEDIATE MEDICAL CARE IF:  You have a fever.   You have increasing pain, severe headaches, or toothache.   You have nausea, vomiting, or  drowsiness.   You develop unusual swelling around the face or trouble seeing.  MAKE SURE YOU:   Understand these instructions.   Will watch your condition.   Will get help right away if you are not doing well or get worse.  Document Released: 07/09/2005 Document Revised: 06/28/2011 Document Reviewed: 02/05/2007 [facility] Patient Information 2012 [place], [place].

## 2011-10-07 NOTE — ED Provider Notes (Signed)
Medical screening examination/treatment/procedure(s) were performed by non-physician practitioner and as supervising physician I was immediately available for consultation/collaboration.    Nelia Shi, MD 10/07/11 [phone]

## 2012-02-09 ENCOUNTER — Encounter (HOSPITAL_COMMUNITY): Payer: Self-pay | Admitting: Emergency Medicine

## 2012-02-09 ENCOUNTER — Emergency Department (HOSPITAL_COMMUNITY)
Admission: EM | Admit: 2012-02-09 | Discharge: 2012-02-09 | Disposition: A | Discharge status: Home / Self Care | Payer: Self-pay | Attending: Emergency Medicine | Admitting: Emergency Medicine

## 2012-02-09 DIAGNOSIS — F17200 Nicotine dependence, unspecified, uncomplicated: Secondary | ICD-10-CM | POA: Insufficient documentation

## 2012-02-09 DIAGNOSIS — I10 Essential (primary) hypertension: Secondary | ICD-10-CM | POA: Insufficient documentation

## 2012-02-09 DIAGNOSIS — J329 Chronic sinusitis, unspecified: Secondary | ICD-10-CM | POA: Insufficient documentation

## 2012-02-09 MED ORDER — AMOXICILLIN 500 MG PO CAPS: 500.0000 mg | ORAL_CAPSULE | Freq: Three times a day (TID) | ORAL | Status: AC

## 2012-02-09 MED ORDER — LISINOPRIL 20 MG PO TABS: 20.0000 mg | ORAL_TABLET | Freq: Every day | ORAL | Status: DC

## 2012-02-09 MED ORDER — FLUTICASONE PROPIONATE 50 MCG/ACT NA SUSP: 2.0000 | Freq: Every day | NASAL | Status: DC

## 2012-02-09 NOTE — ED Provider Notes (Signed)
History     CSN: [id number]  Arrival date & time 02/09/12  8295   First MD Initiated Contact with Patient 02/09/12 1117      11:29 AM HPI Patient reports for over a week has had significant amount of sinus drainage. Reports in the last 3 days patient has had worsening facial pain he describes the pain as a pressure over her cheekbones. Reports 2 days ago developed a fever which resolved on its own. States nasal drainage is now yellowish brown. Reports no improvement with over-the-counter nasal sprays  The history is provided by the patient.    Past Medical History  Diagnosis Date  . Hypertension     Past Surgical History  Procedure Date  . Appendectomy   . Cesarean section   . Tubal ligation     No family history on file.  History  Substance Use Topics  . Smoking status: Current Everyday Smoker -- 0.5 packs/day    Types: Cigarettes  . Smokeless tobacco: Never Used  . Alcohol Use: No    OB History    Grav Para Term Preterm Abortions TAB SAB Ect Mult Living                  Review of Systems  Constitutional: Positive for fever. Negative for chills.  HENT: Positive for congestion, rhinorrhea, postnasal drip and sinus pressure. Negative for ear pain, sore throat, trouble swallowing, neck pain and neck stiffness.   Respiratory: Positive for cough. Negative for shortness of breath.   Cardiovascular: Negative for chest pain.  Gastrointestinal: Negative for nausea, vomiting and abdominal pain.  Neurological: Negative for dizziness and headaches.  All other systems reviewed and are negative.    Allergies  Codeine and Food  Home Medications   Current Outpatient Rx  Name Route Sig Dispense Refill  . ASPIRIN-SALICYLAMIDE-CAFFEINE 650-195-33.3 MG PO PACK Oral Take 1 packet by mouth every 6 (six) hours as needed. PAIN    . LISINOPRIL 20 MG PO TABS Oral Take 1 tablet (20 mg total) by mouth daily. 30 tablet 0  . PSEUDOEPHEDRINE HCL 30 MG PO TABS Oral Take 30 mg by mouth  every 4 (four) hours as needed. CONGESTION      BP 183/87  Pulse 64  Temp 98.5 F (36.9 C) (Oral)  Resp 22  SpO2 100%  Physical Exam  Vitals reviewed. Constitutional: She is oriented to person, place, and time. Vital signs are normal. She appears well-developed and well-nourished.  HENT:  Head: Normocephalic and atraumatic.  Right Ear: Tympanic membrane, external ear and ear canal normal.  Left Ear: Tympanic membrane, external ear and ear canal normal.  Nose: Mucosal edema and rhinorrhea present. Right sinus exhibits maxillary sinus tenderness. Left sinus exhibits maxillary sinus tenderness.  Mouth/Throat: Uvula is midline, oropharynx is clear and moist and mucous membranes are normal.  Eyes: Conjunctivae are normal. Pupils are equal, round, and reactive to light.  Neck: Normal range of motion. Neck supple. No spinous process tenderness and no muscular tenderness present.  Cardiovascular: Normal rate, regular rhythm and normal heart sounds.  Exam reveals no friction rub.   No murmur heard. Pulmonary/Chest: Effort normal and breath sounds normal. She has no wheezes. She has no rhonchi. She has no rales. She exhibits no tenderness.  Musculoskeletal: Normal range of motion.  Neurological: She is alert and oriented to person, place, and time. Coordination normal.  Skin: Skin is warm and dry. No rash noted. No erythema. No pallor.    ED Course  Procedures   MDM   Patient likely has a sinus infection. The symptoms are greater than week, and patient reports fevers will give prescription for amoxicillin. We'll also prescribe Flonase and advised Afrin to help clear sinuses. Patient was hypertensive and states she's been out of her blood pressure medication. Will give prescription for lisinopril. She voices understanding and is ready for gesture      Thomasene Lot, PA-C 02/09/12 1148

## 2012-02-09 NOTE — ED Notes (Signed)
Facial pain and pressure onset 3 days ago, has also noticed that she has been 'off balance" past 2 days. Has self treated at home w/ [facility] powders, did have some improvement in pain. Nasal drainage is brownish/beige, using nose spray at night to help w/ breathing. Thinks she has been running a fever, denies sore throat, coughing up brownish-tan phlegm.

## 2012-02-09 NOTE — ED Provider Notes (Signed)
Medical screening examination/treatment/procedure(s) were performed by non-physician practitioner and as supervising physician I was immediately available for consultation/collaboration.  Derwood Kaplan, MD 02/09/12 1755

## 2012-02-09 NOTE — ED Notes (Signed)
Bed:WTR7<BR> Expected date:<BR> Expected time:<BR> Means of arrival:<BR> Comments:<BR>

## 2012-07-13 ENCOUNTER — Emergency Department (HOSPITAL_COMMUNITY)
Admission: EM | Admit: 2012-07-13 | Discharge: 2012-07-13 | Disposition: A | Discharge status: Home / Self Care | Payer: Self-pay | Attending: Emergency Medicine | Admitting: Emergency Medicine

## 2012-07-13 ENCOUNTER — Emergency Department (HOSPITAL_COMMUNITY): Payer: Self-pay

## 2012-07-13 ENCOUNTER — Encounter (HOSPITAL_COMMUNITY): Payer: Self-pay | Admitting: Emergency Medicine

## 2012-07-13 DIAGNOSIS — B9789 Other viral agents as the cause of diseases classified elsewhere: Secondary | ICD-10-CM | POA: Insufficient documentation

## 2012-07-13 DIAGNOSIS — J029 Acute pharyngitis, unspecified: Secondary | ICD-10-CM | POA: Insufficient documentation

## 2012-07-13 DIAGNOSIS — F17200 Nicotine dependence, unspecified, uncomplicated: Secondary | ICD-10-CM | POA: Insufficient documentation

## 2012-07-13 DIAGNOSIS — I10 Essential (primary) hypertension: Secondary | ICD-10-CM | POA: Insufficient documentation

## 2012-07-13 DIAGNOSIS — J3489 Other specified disorders of nose and nasal sinuses: Secondary | ICD-10-CM | POA: Insufficient documentation

## 2012-07-13 DIAGNOSIS — R509 Fever, unspecified: Secondary | ICD-10-CM | POA: Insufficient documentation

## 2012-07-13 DIAGNOSIS — R0602 Shortness of breath: Secondary | ICD-10-CM | POA: Insufficient documentation

## 2012-07-13 DIAGNOSIS — R52 Pain, unspecified: Secondary | ICD-10-CM | POA: Insufficient documentation

## 2012-07-13 DIAGNOSIS — Z79899 Other long term (current) drug therapy: Secondary | ICD-10-CM | POA: Insufficient documentation

## 2012-07-13 LAB — POCT I-STAT, CHEM 8
Calcium, Ion: 1.12 mmol/L (ref 1.12–1.23)
Glucose, Bld: 87 mg/dL (ref 70–99)
HCT: 33 % — ABNORMAL LOW (ref 36.0–46.0)
Hemoglobin: 11.2 g/dL — ABNORMAL LOW (ref 12.0–15.0)
TCO2: 20 mmol/L (ref 0–100)

## 2012-07-13 IMAGING — CR DG CHEST 2V
2 series · 2 of 2 positions shown · non-contrast
Comparison: [DATE]

CLINICAL DATA: Body aches, cough

CHEST - 2 VIEW

[w chest pa]
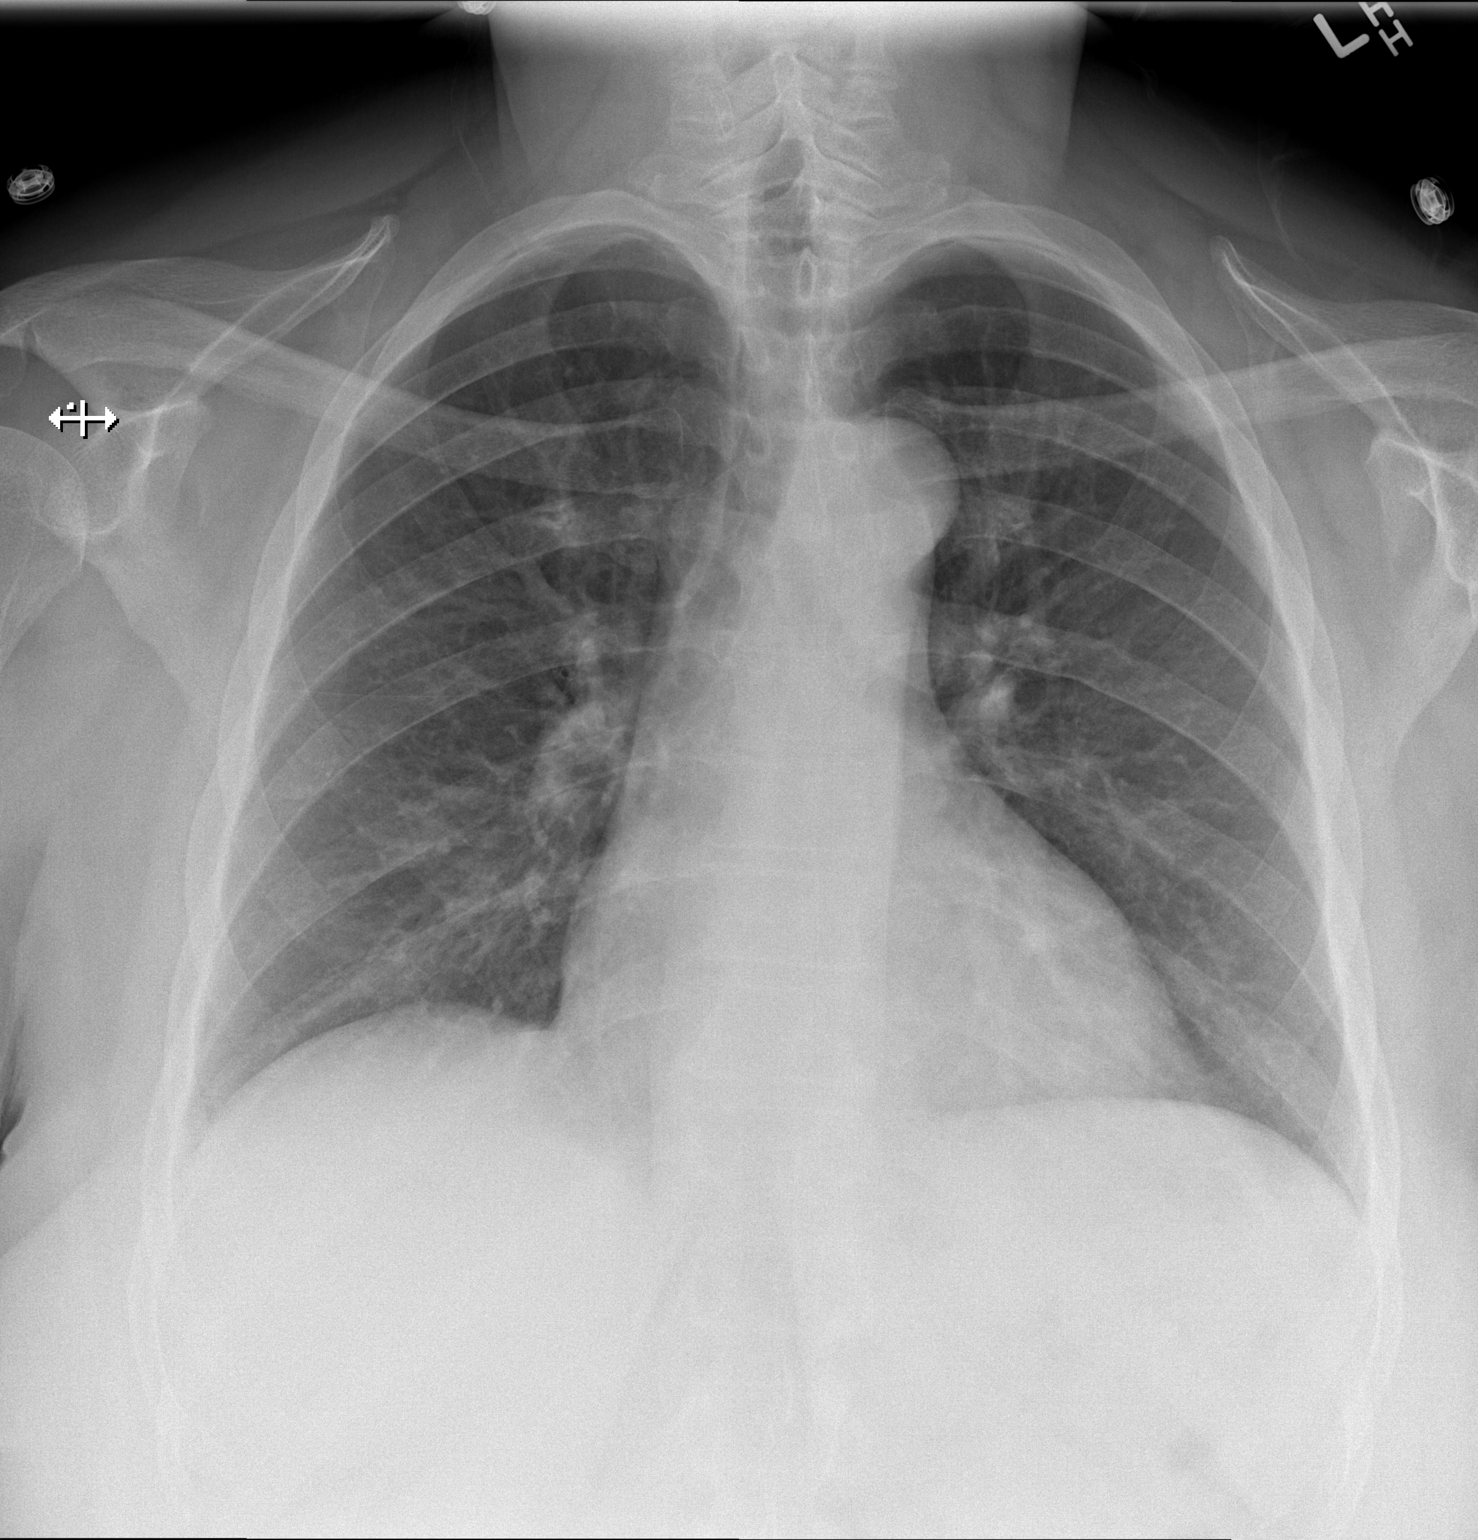

[w chest lat]
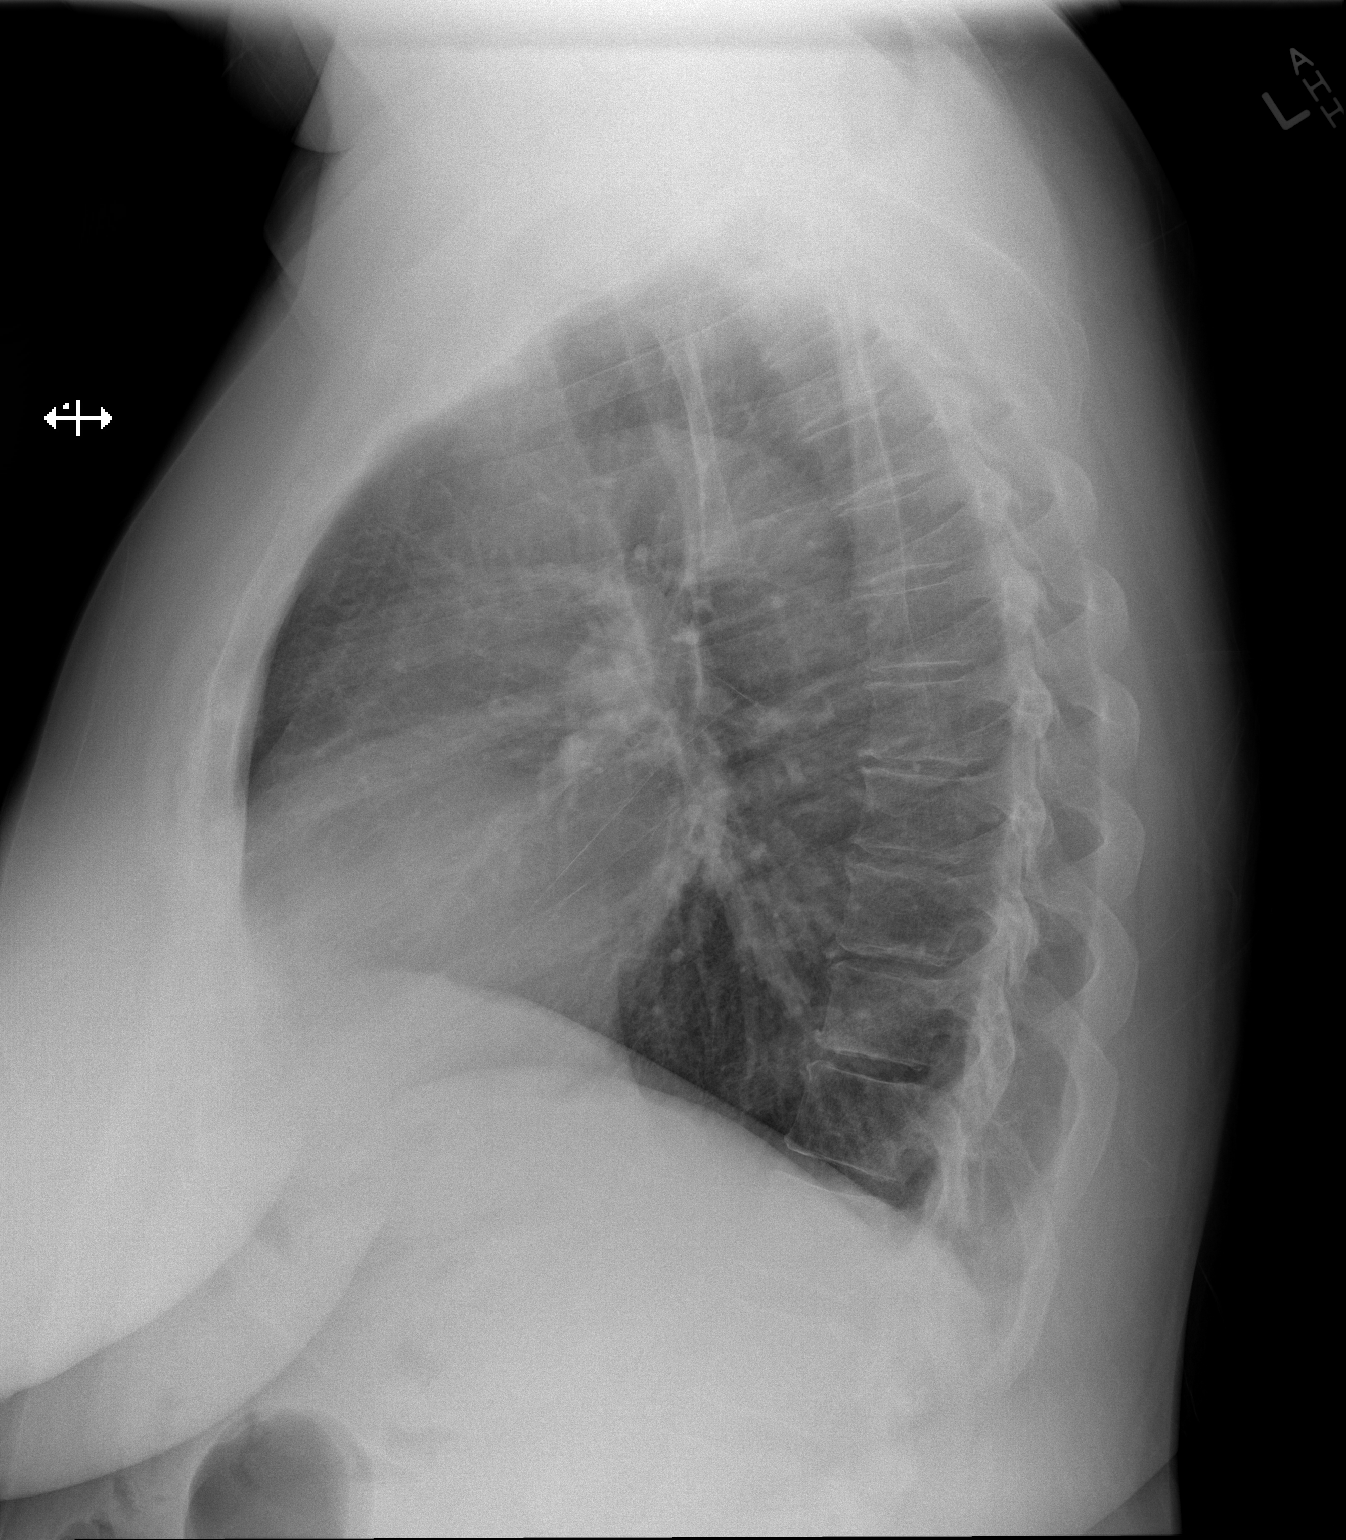

[2 of 2 positions shown; findings below may reference images not displayed]

FINDINGS: Cardiomediastinal silhouette is stable.  No acute
infiltrate or pleural effusion.  No pulmonary edema.  Bony thorax
is stable.
IMPRESSION: No active disease.  No significant change.

## 2012-07-13 MED ORDER — LISINOPRIL 20 MG PO TABS
20.0000 mg | ORAL_TABLET | Freq: Once | ORAL | Status: AC
  Administered 2012-07-13: 20 mg via ORAL
  Filled 2012-07-13: qty 1

## 2012-07-13 MED ORDER — LISINOPRIL 20 MG PO TABS: 20.0000 mg | ORAL_TABLET | Freq: Every day | ORAL | Status: DC

## 2012-07-13 NOTE — ED Notes (Signed)
Pt from home c/o flu-like s/sx x 1week. Pt reports fever, body aches, productive cough with yellow sputum, HA, and HTN. Pt states that she is out of her BP meds (lisinopril 20mg ). Pt also reports SOB but denies CP. Pt in NAD and A&Ox4

## 2012-07-13 NOTE — ED Provider Notes (Signed)
History     CSN: [id number]  Arrival date & time 07/13/12  7829   First MD Initiated Contact with Patient 07/13/12 [phone]      Chief Complaint  Patient presents with  . Generalized Body Aches  . Cough  . Fever  . Sore Throat    (Consider location/radiation/quality/duration/timing/severity/associated sxs/prior treatment) HPI Complaint of cough headache nasal congestion and sore throat for 6 days. Treated with Tylenol, without relief. Cough is productive of yellowish sputum. The maximum temperature 101 3 days ago. No other associated symptoms. Past Medical History  Diagnosis Date  . Hypertension    Noncompliant with medications for several weeks Past Surgical History  Procedure Date  . Appendectomy   . Cesarean section   . Tubal ligation     No family history on file.  History  Substance Use Topics  . Smoking status: Current Every Day Smoker -- 0.5 packs/day    Types: Cigarettes  . Smokeless tobacco: Never Used  . Alcohol Use: No    OB History    Grav Para Term Preterm Abortions TAB SAB Ect Mult Living                  Review of Systems  Constitutional: Positive for fever.  HENT: Positive for congestion and sore throat.   Respiratory: Positive for cough and shortness of breath.     Allergies  Codeine and Food  Home Medications   Current Outpatient Rx  Name  Route  Sig  Dispense  Refill  . ASPIRIN-SALICYLAMIDE-CAFFEINE 650-195-33.3 MG PO PACK   Oral   Take 1 packet by mouth every 6 (six) hours as needed. PAIN         . FLUTICASONE PROPIONATE 50 MCG/ACT NA SUSP   Nasal   Place 2 sprays into the nose daily.   16 g   2   . LISINOPRIL 20 MG PO TABS   Oral   Take 1 tablet (20 mg total) by mouth daily.   30 tablet   0   . LISINOPRIL 20 MG PO TABS   Oral   Take 1 tablet (20 mg total) by mouth daily.   30 tablet   0   . PSEUDOEPHEDRINE HCL 30 MG PO TABS   Oral   Take 30 mg by mouth every 4 (four) hours as needed. CONGESTION            BP 169/106  Pulse 66  Temp 98.4 F (36.9 C) (Oral)  Resp 16  Ht 5\' 7"  (1.702 m)  Wt 224 lb (101.606 kg)  BMI 35.08 kg/m2  SpO2 100%  LMP 06/24/2012  Physical Exam  Nursing note and vitals reviewed. Constitutional: She appears well-developed and well-nourished.  HENT:  Head: Normocephalic and atraumatic.  Right Ear: External ear normal.  Left Ear: External ear normal.       Oropharynx minimally reddened bilateral tympanic membranes normal  Eyes: Conjunctivae normal are normal. Pupils are equal, round, and reactive to light.  Neck: Neck supple. No tracheal deviation present. No thyromegaly present.  Cardiovascular: Normal rate and regular rhythm.   No murmur heard. Pulmonary/Chest: Effort normal and breath sounds normal. No respiratory distress.  Abdominal: Soft. Bowel sounds are normal. She exhibits no distension. There is no tenderness.       OBese  Musculoskeletal: Normal range of motion. She exhibits no edema and no tenderness.  Lymphadenopathy:    She has no cervical adenopathy.  Neurological: She is alert. Coordination normal.  Skin: Skin is  warm and dry. No rash noted.  Psychiatric: She has a normal mood and affect.    ED Course  Procedures (including critical care time)  Labs Reviewed - No data to display No results found.  Chest x-ray reviewed by me No diagnosis found. Results for orders placed during the hospital encounter of 07/13/12  POCT I-STAT, CHEM 8      Component Value Range   Sodium 143  135 - 145 mEq/L   Potassium 3.8  3.5 - 5.1 mEq/L   Chloride 115 (*) 96 - 112 mEq/L   BUN 12  6 - 23 mg/dL   Creatinine, Ser 1.61  0.50 - 1.10 mg/dL   Glucose, Bld 87  70 - 99 mg/dL   Calcium, Ion 0.96  0.45 - 1.23 mmol/L   TCO2 20  0 - 100 mmol/L   Hemoglobin 11.2 (*) 12.0 - 15.0 g/dL   HCT 40.9 (*) 81.1 - 91.4 %   Dg Chest 2 View  07/13/2012  *RADIOLOGY REPORT*  Clinical Data: Body aches, cough  CHEST - 2 VIEW  Comparison: 10/06/2011  Findings:  Cardiomediastinal silhouette is stable.  No acute infiltrate or pleural effusion.  No pulmonary edema.  Bony thorax is stable.  IMPRESSION:   No active disease.  No significant change.   Original Report Authenticated By: Natasha Mead, M.D.     11 AM patient resting comfortably no distress MDM  Assessment symptoms likely viral in etiology   prescription for lisinopril 20 mg daily, one-month supply Saline nasal spray Tylenol for aches Referral resource guide and  urgent care center Smoking cessation encouraged. Councilled pt for 5 minutes on smoking cessation. Diagnosis #1 viral respiratory illness #2 hypertension #3 tobacco abuse And medication noncompliance          Doug Sou, MD 07/13/12 1108

## 2012-10-22 ENCOUNTER — Other Ambulatory Visit: Payer: Self-pay

## 2012-10-22 ENCOUNTER — Encounter (HOSPITAL_COMMUNITY): Payer: Self-pay | Admitting: Emergency Medicine

## 2012-10-22 ENCOUNTER — Emergency Department (HOSPITAL_COMMUNITY)
Admission: EM | Admit: 2012-10-22 | Discharge: 2012-10-22 | Disposition: A | Discharge status: Home / Self Care | Payer: BC Managed Care – PPO | Attending: Emergency Medicine | Admitting: Emergency Medicine

## 2012-10-22 DIAGNOSIS — Z79899 Other long term (current) drug therapy: Secondary | ICD-10-CM | POA: Insufficient documentation

## 2012-10-22 DIAGNOSIS — J329 Chronic sinusitis, unspecified: Secondary | ICD-10-CM | POA: Insufficient documentation

## 2012-10-22 DIAGNOSIS — R42 Dizziness and giddiness: Secondary | ICD-10-CM | POA: Insufficient documentation

## 2012-10-22 DIAGNOSIS — I10 Essential (primary) hypertension: Secondary | ICD-10-CM | POA: Insufficient documentation

## 2012-10-22 DIAGNOSIS — F17200 Nicotine dependence, unspecified, uncomplicated: Secondary | ICD-10-CM | POA: Insufficient documentation

## 2012-10-22 LAB — BASIC METABOLIC PANEL
Chloride: 106 mEq/L (ref 96–112)
Creatinine, Ser: 0.82 mg/dL (ref 0.50–1.10)
GFR calc Af Amer: >90 mL/min (ref >90)
Sodium: 138 mEq/L (ref 135–145)

## 2012-10-22 LAB — CBC WITH DIFFERENTIAL/PLATELET
Basophils Absolute: 0 10*3/uL (ref 0.0–0.1)
Basophils Relative: 1 % (ref 0–1)
HCT: 34.9 % — ABNORMAL LOW (ref 36.0–46.0)
MCHC: 32.4 g/dL (ref 30.0–36.0)
Monocytes Absolute: 0.5 10*3/uL (ref 0.1–1.0)
Neutro Abs: 2.3 10*3/uL (ref 1.7–7.7)
Neutrophils Relative %: 56 % (ref 43–77)
Platelets: 283 10*3/uL (ref 150–400)
RDW: 18.5 % — ABNORMAL HIGH (ref 11.5–15.5)

## 2012-10-22 MED ORDER — LABETALOL HCL 5 MG/ML IV SOLN
10.0000 mg | Freq: Once | INTRAVENOUS | Status: AC
  Administered 2012-10-22: 10 mg via INTRAVENOUS
  Filled 2012-10-22: qty 4

## 2012-10-22 MED ORDER — AZITHROMYCIN 250 MG PO TABS: ORAL_TABLET | ORAL | Status: DC

## 2012-10-22 MED ORDER — FLUTICASONE PROPIONATE 50 MCG/ACT NA SUSP: 2.0000 | Freq: Every day | NASAL | Status: DC

## 2012-10-22 MED ORDER — HYDROCHLOROTHIAZIDE 12.5 MG PO TABS: 12.5000 mg | ORAL_TABLET | Freq: Every day | ORAL | Status: DC

## 2012-10-22 MED ORDER — LISINOPRIL 20 MG PO TABS: 20.0000 mg | ORAL_TABLET | Freq: Every day | ORAL | Status: DC

## 2012-10-22 NOTE — ED Provider Notes (Signed)
History     CSN: [id number]  Arrival date & time 10/22/12  1225   First MD Initiated Contact with Patient 10/22/12 1325      Chief Complaint  Patient presents with  . Headache  . Dizziness    (Consider location/radiation/quality/duration/timing/severity/associated sxs/prior treatment) HPI Comments: Patient presents to the ER for evaluation of headache. Patient reports that she has been experiencing a headache for 2 days. Patient reports he frontal throbbing headache that is moderate to severe. She reports that the pain waxes and wanes. Patient admits that she has been off of her blood pressure medication for some time. She ran out and does not have a primary care Dr. Patient denies numbness, tingling, weakness in the extremities. She has not had any chest pain or shortness of breath. Patient has had blurred vision associated with headache. She reports that all symptoms were worse yesterday, somewhat better today.  She also reports sinus congestion 2 along with a headache. She has a history of sinus problems and seasonal allergies.  Patient is a 52 y.o. female presenting with headaches.  Headache   Past Medical History  Diagnosis Date  . Hypertension     Past Surgical History  Procedure Laterality Date  . Appendectomy    . Cesarean section    . Tubal ligation      No family history on file.  History  Substance Use Topics  . Smoking status: Current Every Day Smoker -- 0.50 packs/day    Types: Cigarettes  . Smokeless tobacco: Never Used  . Alcohol Use: No    OB History   Grav Para Term Preterm Abortions TAB SAB Ect Mult Living                  Review of Systems  Respiratory: Negative.   Cardiovascular: Negative.   Neurological: Positive for headaches.  All other systems reviewed and are negative.    Allergies  Codeine and Food  Home Medications   Current Outpatient Rx  Name  Route  Sig  Dispense  Refill  . acetaminophen (TYLENOL) 500 MG tablet   Oral  Take 500 mg by mouth every 6 (six) hours as needed. fever         . lisinopril (PRINIVIL,ZESTRIL) 20 MG tablet   Oral   Take 1 tablet (20 mg total) by mouth daily.   30 tablet   0     BP 172/96  Pulse 72  Temp(Src) 99.2 F (37.3 C) (Oral)  Resp 20  SpO2 97%  Physical Exam  Constitutional: She is oriented to person, place, and time. She appears well-developed and well-nourished. No distress.  HENT:  Head: Normocephalic and atraumatic.  Right Ear: Hearing normal.  Nose: Nose normal.  Mouth/Throat: Oropharynx is clear and moist and mucous membranes are normal.  Eyes: Conjunctivae and EOM are normal. Pupils are equal, round, and reactive to light.  Neck: Normal range of motion. Neck supple.  Cardiovascular: Normal rate, regular rhythm, S1 normal and S2 normal.  Exam reveals no gallop and no friction rub.   No murmur heard. Pulmonary/Chest: Effort normal and breath sounds normal. No respiratory distress. She exhibits no tenderness.  Abdominal: Soft. Normal appearance and bowel sounds are normal. There is no hepatosplenomegaly. There is no tenderness. There is no rebound, no guarding, no tenderness at McBurney's point and negative Murphy's sign. No hernia.  Musculoskeletal: Normal range of motion.  Neurological: She is alert and oriented to person, place, and time. She has normal strength. No  cranial nerve deficit or sensory deficit. Coordination normal. GCS eye subscore is 4. GCS verbal subscore is 5. GCS motor subscore is 6.  Skin: Skin is warm, dry and intact. No rash noted. No cyanosis.  Psychiatric: She has a normal mood and affect. Her speech is normal and behavior is normal. Thought content normal.    ED Course  Procedures (including critical care time)  Labs Reviewed  CBC WITH DIFFERENTIAL - Abnormal; Notable for the following:    Hemoglobin 11.3 (*)    HCT 34.9 (*)    RDW 18.5 (*)    All other components within normal limits  BASIC METABOLIC PANEL   No results  found.   Diagnosis: 1. Uncontrolled hypertension 2. Headache 3. Sinusitis    MDM  She presents to the ER for evaluation of headache. Patient has had symptoms for several days. She admits to being off of her blood pressure medication for an unknown period of time. Blood pressure is very elevated here in the ER. She was treated with labetalol, blood pressure improved and her symptoms also improved. She no longer has a headache. She is still complaining of some sinus congestion facial pain. She does have a history of sinus problems and likely has an acute sinus infection. Patient will be treated with Zithromax and Flonase. Additionally will be placed back on blood pressure medication. She reports that the lisinopril never completely controlled her blood pressure, will prescribe lisinopril with hydrochlorothiazide.        Gilda Crease, MD 10/22/12 1455

## 2012-10-22 NOTE — ED Notes (Addendum)
Pt here for c/o dizziness and h/a x2 days.Pt stated that she had blurred

## 2013-04-02 ENCOUNTER — Encounter (HOSPITAL_COMMUNITY): Payer: Self-pay | Admitting: *Deleted

## 2013-04-02 ENCOUNTER — Emergency Department (HOSPITAL_COMMUNITY)
Admission: EM | Admit: 2013-04-02 | Discharge: 2013-04-02 | Discharge status: Against Medical Advice | Payer: BC Managed Care – PPO | Attending: Emergency Medicine | Admitting: Emergency Medicine

## 2013-04-02 DIAGNOSIS — J3489 Other specified disorders of nose and nasal sinuses: Secondary | ICD-10-CM | POA: Insufficient documentation

## 2013-04-02 DIAGNOSIS — R51 Headache: Secondary | ICD-10-CM | POA: Insufficient documentation

## 2013-04-02 DIAGNOSIS — R059 Cough, unspecified: Secondary | ICD-10-CM | POA: Insufficient documentation

## 2013-04-02 DIAGNOSIS — F17200 Nicotine dependence, unspecified, uncomplicated: Secondary | ICD-10-CM | POA: Insufficient documentation

## 2013-04-02 DIAGNOSIS — I10 Essential (primary) hypertension: Secondary | ICD-10-CM | POA: Insufficient documentation

## 2013-04-02 DIAGNOSIS — R05 Cough: Secondary | ICD-10-CM | POA: Insufficient documentation

## 2013-04-02 NOTE — ED Notes (Signed)
Pt states for the past 5-6 days she's had nasal congestion, productive cough w/ yellow sputum and head pressure.

## 2013-06-29 ENCOUNTER — Encounter (HOSPITAL_COMMUNITY): Payer: Self-pay | Admitting: Emergency Medicine

## 2013-06-29 ENCOUNTER — Emergency Department (HOSPITAL_COMMUNITY)
Admission: EM | Admit: 2013-06-29 | Discharge: 2013-06-29 | Discharge status: Against Medical Advice | Payer: BC Managed Care – PPO | Attending: Emergency Medicine | Admitting: Emergency Medicine

## 2013-06-29 DIAGNOSIS — F17200 Nicotine dependence, unspecified, uncomplicated: Secondary | ICD-10-CM | POA: Insufficient documentation

## 2013-06-29 DIAGNOSIS — R059 Cough, unspecified: Secondary | ICD-10-CM | POA: Insufficient documentation

## 2013-06-29 DIAGNOSIS — R51 Headache: Secondary | ICD-10-CM | POA: Insufficient documentation

## 2013-06-29 DIAGNOSIS — I10 Essential (primary) hypertension: Secondary | ICD-10-CM | POA: Insufficient documentation

## 2013-06-29 DIAGNOSIS — R05 Cough: Secondary | ICD-10-CM | POA: Insufficient documentation

## 2013-06-29 NOTE — ED Notes (Signed)
Patient reports a productive cough with yellow sputum, yellow sinus drainage, and a frontal headache x 3 days.

## 2013-06-29 NOTE — ED Notes (Signed)
Called x1 to be roomed no answer

## 2013-06-29 NOTE — Progress Notes (Signed)
[facility] did not get to see patient but will be sending information about the [facility] [employer] Card program, using the address provided.  °

## 2014-01-09 ENCOUNTER — Emergency Department (HOSPITAL_COMMUNITY)
Admission: EM | Admit: 2014-01-09 | Discharge: 2014-01-09 | Disposition: A | Discharge status: Home / Self Care | Payer: BC Managed Care – PPO | Attending: Emergency Medicine | Admitting: Emergency Medicine

## 2014-01-09 ENCOUNTER — Encounter (HOSPITAL_COMMUNITY): Payer: Self-pay | Admitting: Emergency Medicine

## 2014-01-09 DIAGNOSIS — F17200 Nicotine dependence, unspecified, uncomplicated: Secondary | ICD-10-CM | POA: Insufficient documentation

## 2014-01-09 DIAGNOSIS — R51 Headache: Secondary | ICD-10-CM | POA: Insufficient documentation

## 2014-01-09 DIAGNOSIS — R519 Headache, unspecified: Secondary | ICD-10-CM

## 2014-01-09 DIAGNOSIS — Z79899 Other long term (current) drug therapy: Secondary | ICD-10-CM | POA: Insufficient documentation

## 2014-01-09 DIAGNOSIS — R110 Nausea: Secondary | ICD-10-CM | POA: Insufficient documentation

## 2014-01-09 DIAGNOSIS — I10 Essential (primary) hypertension: Secondary | ICD-10-CM | POA: Insufficient documentation

## 2014-01-09 DIAGNOSIS — R350 Frequency of micturition: Secondary | ICD-10-CM | POA: Insufficient documentation

## 2014-01-09 LAB — COMPREHENSIVE METABOLIC PANEL
ALT: 19 U/L (ref 0–35)
AST: 26 U/L (ref 0–37)
Albumin: 3.3 g/dL — ABNORMAL LOW (ref 3.5–5.2)
Alkaline Phosphatase: 78 U/L (ref 39–117)
BUN: 16 mg/dL (ref 6–23)
CALCIUM: 9.1 mg/dL (ref 8.4–10.5)
CO2: 22 meq/L (ref 19–32)
CREATININE: 0.76 mg/dL (ref 0.50–1.10)
Chloride: 106 mEq/L (ref 96–112)
GLUCOSE: 116 mg/dL — AB (ref 70–99)
Potassium: 4 mEq/L (ref 3.7–5.3)
SODIUM: 141 meq/L (ref 137–147)
TOTAL PROTEIN: 7.1 g/dL (ref 6.0–8.3)
Total Bilirubin: <0.2 mg/dL — ABNORMAL LOW (ref 0.3–1.2)

## 2014-01-09 LAB — CBC
HCT: 37.8 % (ref 36.0–46.0)
HEMOGLOBIN: 12.6 g/dL (ref 12.0–15.0)
MCH: 30.7 pg (ref 26.0–34.0)
MCHC: 33.3 g/dL (ref 30.0–36.0)
MCV: 92.2 fL (ref 78.0–100.0)
Platelets: 247 10*3/uL (ref 150–400)
RBC: 4.1 MIL/uL (ref 3.87–5.11)
RDW: 16.3 % — ABNORMAL HIGH (ref 11.5–15.5)
WBC: 3.5 10*3/uL — ABNORMAL LOW (ref 4.0–10.5)

## 2014-01-09 LAB — URINALYSIS, ROUTINE W REFLEX MICROSCOPIC
Bilirubin Urine: NEGATIVE
Glucose, UA: NEGATIVE mg/dL
Hgb urine dipstick: NEGATIVE
Ketones, ur: NEGATIVE mg/dL
LEUKOCYTES UA: NEGATIVE
NITRITE: NEGATIVE
PH: 6.5 (ref 5.0–8.0)
Protein, ur: NEGATIVE mg/dL
SPECIFIC GRAVITY, URINE: 1.008 (ref 1.005–1.030)
UROBILINOGEN UA: 1 mg/dL (ref 0.0–1.0)

## 2014-01-09 MED ORDER — LISINOPRIL-HYDROCHLOROTHIAZIDE 10-12.5 MG PO TABS: 1.0000 | ORAL_TABLET | Freq: Every day | ORAL | Status: DC

## 2014-01-09 MED ORDER — ACETAMINOPHEN 325 MG PO TABS
650.0000 mg | ORAL_TABLET | Freq: Once | ORAL | Status: AC
  Administered 2014-01-09: 650 mg via ORAL
  Filled 2014-01-09: qty 2

## 2014-01-09 MED ORDER — ONDANSETRON 8 MG PO TBDP
8.0000 mg | ORAL_TABLET | Freq: Once | ORAL | Status: AC
  Administered 2014-01-09: 8 mg via ORAL
  Filled 2014-01-09: qty 1

## 2014-01-09 NOTE — ED Notes (Signed)
Pt arrived to the ED with a complaint of a headache and nausea.  Pt is on blood pressure medication but ran out of her prescription on Monday.  Symptoms off headache, nausea and feeling unwell started shortly after.

## 2014-01-09 NOTE — ED Provider Notes (Signed)
CSN: [id number]     Arrival date & time 01/09/14  1925 History   First MD Initiated Contact with Patient 01/09/14 1948     Chief Complaint  Patient presents with  . Headache  . Nausea     (Consider location/radiation/quality/duration/timing/severity/associated sxs/prior Treatment) Patient is a 53 y.o. female presenting with headaches. The history is provided by the patient.  Headache Associated symptoms: nausea   Associated symptoms: no abdominal pain, no back pain, no cough, no diarrhea, no fever, no neck pain, no neck stiffness, no numbness and no sore throat   pt states ran out of her bp medication a couple weeks ago, and states this week does not feel well. Notes intermittent frontal headaches since this past Monday, dull, gradual onset, moderate, c/w prior headaches. Notes hx sinus headaches, mild sinus congestion, no purulent drainage. No fever or chills. Denies eye pain or change in vision. No neck pain or stiffness. No numbness/weakness, or loss of her normal functional ability. Pt drove to ED.  States also has noticed urinary frequency in past week, no dysuria. No hx diabetes. No wt loss. Also notes nausea, w 1-2 episodes emesis in past week. Emesis clear, not bloody or bilious. No chest pain or discomfort. No unusual fatigue or doe.      Past Medical History  Diagnosis Date  . Hypertension    Past Surgical History  Procedure Laterality Date  . Appendectomy    . Cesarean section    . Tubal ligation     Family History  Problem Relation Age of Onset  . Heart failure Mother   . Diabetes Mother    History  Substance Use Topics  . Smoking status: Current Every Day Smoker -- 0.50 packs/day    Types: Cigarettes  . Smokeless tobacco: Never Used  . Alcohol Use: No   OB History   Grav Para Term Preterm Abortions TAB SAB Ect Mult Living                 Review of Systems  Constitutional: Negative for fever and chills.  HENT: Negative for sore throat.   Eyes: Negative  for redness.  Respiratory: Negative for cough and shortness of breath.   Cardiovascular: Negative for chest pain and leg swelling.  Gastrointestinal: Positive for nausea. Negative for abdominal pain, diarrhea, constipation and abdominal distention.  Genitourinary: Positive for frequency. Negative for dysuria and flank pain.  Musculoskeletal: Negative for back pain, neck pain and neck stiffness.  Skin: Negative for rash.  Neurological: Positive for headaches. Negative for syncope, weakness and numbness.  Hematological: Does not bruise/bleed easily.  Psychiatric/Behavioral: Negative for confusion.      Allergies  Codeine and Food  Home Medications   Prior to Admission medications   Medication Sig Start Date End Date Taking? Authorizing Ajah Vanhoose  Aspirin-Salicylamide-Caffeine (BC HEADACHE POWDER PO) Take 2 packets by mouth 3 (three) times daily as needed (pain).    Yes Historical Red Mandt, MD  lisinopril-hydrochlorothiazide (PRINZIDE,ZESTORETIC) 20-12.5 MG per tablet Take 1 tablet by mouth every morning.   Yes Historical Mechel Schutter, MD   BP 210/90  Pulse 66  Temp(Src) 98.4 F (36.9 C) (Oral)  Resp 20  SpO2 100% Physical Exam  Nursing note and vitals reviewed. Constitutional: She is oriented to person, place, and time. She appears well-developed and well-nourished. No distress.  HENT:  Head: Atraumatic.  Nose: Nose normal.  Mouth/Throat: Oropharynx is clear and moist.  No sinus or temporal tenderness.  Eyes: Conjunctivae and EOM are normal. Pupils are  equal, round, and reactive to light. No scleral icterus.  Neck: Neck supple. No tracheal deviation present. No thyromegaly present.  No stiffness or rigidity.   Cardiovascular: Normal rate, regular rhythm, normal heart sounds and intact distal pulses.  Exam reveals no gallop and no friction rub.   No murmur heard. Pulmonary/Chest: Effort normal and breath sounds normal. No respiratory distress.  Abdominal: Soft. Normal appearance  and bowel sounds are normal. She exhibits no distension and no mass. There is no tenderness. There is no rebound and no guarding.  Genitourinary:  No cva tenderness.  Musculoskeletal: Normal range of motion. She exhibits no edema and no tenderness.  Neurological: She is alert and oriented to person, place, and time. No cranial nerve deficit.  Motor intact bilaterally. Steady gait.   Skin: Skin is warm and dry. No rash noted. She is not diaphoretic.  Psychiatric: She has a normal mood and affect.    ED Course  Procedures (including critical care time) Labs Review  Results for orders placed during the hospital encounter of 01/09/14  CBC      Result Value Ref Range   WBC 3.5 (*) 4.0 - 10.5 K/uL   RBC 4.10  3.87 - 5.11 MIL/uL   Hemoglobin 12.6  12.0 - 15.0 g/dL   HCT 37.8  36.0 - 46.0 %   MCV 92.2  78.0 - 100.0 fL   MCH 30.7  26.0 - 34.0 pg   MCHC 33.3  30.0 - 36.0 g/dL   RDW 16.3 (*) 11.5 - 15.5 %   Platelets 247  150 - 400 K/uL  COMPREHENSIVE METABOLIC PANEL      Result Value Ref Range   Sodium 141  137 - 147 mEq/L   Potassium 4.0  3.7 - 5.3 mEq/L   Chloride 106  96 - 112 mEq/L   CO2 22  19 - 32 mEq/L   Glucose, Bld 116 (*) 70 - 99 mg/dL   BUN 16  6 - 23 mg/dL   Creatinine, Ser 0.76  0.50 - 1.10 mg/dL   Calcium 9.1  8.4 - 10.5 mg/dL   Total Protein 7.1  6.0 - 8.3 g/dL   Albumin 3.3 (*) 3.5 - 5.2 g/dL   AST 26  0 - 37 U/L   ALT 19  0 - 35 U/L   Alkaline Phosphatase 78  39 - 117 U/L   Total Bilirubin <0.2 (*) 0.3 - 1.2 mg/dL   GFR calc non Af Amer >90  >90 mL/min   GFR calc Af Amer >90  >90 mL/min  URINALYSIS, ROUTINE W REFLEX MICROSCOPIC      Result Value Ref Range   Color, Urine YELLOW  YELLOW   APPearance CLEAR  CLEAR   Specific Gravity, Urine 1.008  1.005 - 1.030   pH 6.5  5.0 - 8.0   Glucose, UA NEGATIVE  NEGATIVE mg/dL   Hgb urine dipstick NEGATIVE  NEGATIVE   Bilirubin Urine NEGATIVE  NEGATIVE   Ketones, ur NEGATIVE  NEGATIVE mg/dL   Protein, ur NEGATIVE   NEGATIVE mg/dL   Urobilinogen, UA 1.0  0.0 - 1.0 mg/dL   Nitrite NEGATIVE  NEGATIVE   Leukocytes, UA NEGATIVE  NEGATIVE      MDM  Labs. Po fluids. Zofran, tylenol.  Reviewed nursing notes and prior charts for additional history.   Recheck, pt feels improved, comfortable. No nv. Tolerating po fluids. bp improved 179/79. .  Pt appears stable for d/c.      Mirna Mires, MD  01/09/14 2123 

## 2014-01-09 NOTE — Discharge Instructions (Signed)
Your blood pressure is high tonight.  Take your blood pressure medication as prescribed, limit salt intake, and follow up with primary care doctor in the next 1-2 weeks for recheck. Return to ER if worse, new symptoms, severe headache, numbness/weakness, chest pain, trouble breathing, other concern.    Hypertension Hypertension, commonly called high blood pressure, is when the force of blood pumping through your arteries is too strong. Your arteries are the blood vessels that carry blood from your heart throughout your body. A blood pressure reading consists of a higher number over a lower number, such as 110/72. The higher number (systolic) is the pressure inside your arteries when your heart pumps. The lower number (diastolic) is the pressure inside your arteries when your heart relaxes. Ideally you want your blood pressure below 120/80. Hypertension forces your heart to work harder to pump blood. Your arteries may become narrow or stiff. Having hypertension puts you at risk for heart disease, stroke, and other problems.  RISK FACTORS Some risk factors for high blood pressure are controllable. Others are not.  Risk factors you cannot control include:   Race. You may be at higher risk if you are African American.  Age. Risk increases with age.  Gender. Men are at higher risk than women before age 29 years. After age 17, women are at higher risk than men. Risk factors you can control include:  Not getting enough exercise or physical activity.  Being overweight.  Getting too much fat, sugar, calories, or salt in your diet.  Drinking too much alcohol. SIGNS AND SYMPTOMS Hypertension does not usually cause signs or symptoms. Extremely high blood pressure (hypertensive crisis) may cause headache, anxiety, shortness of breath, and nosebleed. DIAGNOSIS  To check if you have hypertension, your health care provider will measure your blood pressure while you are seated, with your arm held at the  level of your heart. It should be measured at least twice using the same arm. Certain conditions can cause a difference in blood pressure between your right and left arms. A blood pressure reading that is higher than normal on one occasion does not mean that you need treatment. If one blood pressure reading is high, ask your health care provider about having it checked again. TREATMENT  Treating high blood pressure includes making lifestyle changes and possibly taking medication. Living a healthy lifestyle can help lower high blood pressure. You may need to change some of your habits. Lifestyle changes may include:  Following the DASH diet. This diet is high in fruits, vegetables, and whole grains. It is low in salt, red meat, and added sugars.  Getting at least 2 1/2 hours of brisk physical activity every week.  Losing weight if necessary.  Not smoking.  Limiting alcoholic beverages.  Learning ways to reduce stress. If lifestyle changes are not enough to get your blood pressure under control, your health care provider may prescribe medicine. You may need to take more than one. Work closely with your health care provider to understand the risks and benefits. HOME CARE INSTRUCTIONS  Have your blood pressure rechecked as directed by your health care provider.   Only take medicine as directed by your health care provider. Follow the directions carefully. Blood pressure medicines must be taken as prescribed. The medicine does not work as well when you skip doses. Skipping doses also puts you at risk for problems.   Do not smoke.   Monitor your blood pressure at home as directed by your health care provider.  SEEK MEDICAL CARE IF:   You think you are having a reaction to medicines taken.  You have recurrent headaches or feel dizzy.  You have swelling in your ankles.  You have trouble with your vision. SEEK IMMEDIATE MEDICAL CARE IF:  You develop a severe headache or  confusion.  You have unusual weakness, numbness, or feel faint.  You have severe chest or abdominal pain.  You vomit repeatedly.  You have trouble breathing. MAKE SURE YOU:   Understand these instructions.  Will watch your condition.  Will get help right away if you are not doing well or get worse. Document Released: 07/09/2005 Document Revised: 07/14/2013 Document Reviewed: 05/01/2013 [facility] Patient Information 2015 [place], [place]. This information is not intended to replace advice given to you by your health care provider. Make sure you discuss any questions you have with your health care provider.   Headaches, Frequently Asked Questions MIGRAINE HEADACHES Q: What is migraine? What causes it? How can I treat it? A: Generally, migraine headaches begin as a dull ache. Then they develop into a constant, throbbing, and pulsating pain. You may experience pain at the temples. You may experience pain at the front or back of one or both sides of the head. The pain is usually accompanied by a combination of:  Nausea.  Vomiting.  Sensitivity to light and noise. Some people (about 15%) experience an aura (see below) before an attack. The cause of migraine is believed to be chemical reactions in the brain. Treatment for migraine may include over-the-counter or prescription medications. It may also include self-help techniques. These include relaxation training and biofeedback.  Q: What is an aura? A: About 15% of people with migraine get an "aura". This is a sign of neurological symptoms that occur before a migraine headache. You may see wavy or jagged lines, dots, or flashing lights. You might experience tunnel vision or blind spots in one or both eyes. The aura can include visual or auditory hallucinations (something imagined). It may include disruptions in smell (such as strange odors), taste or touch. Other symptoms include:  Numbness.  A "pins and needles" sensation.  Difficulty  in recalling or speaking the correct word. These neurological events may last as long as 60 minutes. These symptoms will fade as the headache begins. Q: What is a trigger? A: Certain physical or environmental factors can lead to or "trigger" a migraine. These include:  Foods.  Hormonal changes.  Weather.  Stress. It is important to remember that triggers are different for everyone. To help prevent migraine attacks, you need to figure out which triggers affect you. Keep a headache diary. This is a good way to track triggers. The diary will help you talk to your healthcare professional about your condition. Q: Does weather affect migraines? A: Bright sunshine, hot, humid conditions, and drastic changes in barometric pressure may lead to, or "trigger," a migraine attack in some people. But studies have shown that weather does not act as a trigger for everyone with migraines. Q: What is the link between migraine and hormones? A: Hormones start and regulate many of your body's functions. Hormones keep your body in balance within a constantly changing environment. The levels of hormones in your body are unbalanced at times. Examples are during menstruation, pregnancy, or menopause. That can lead to a migraine attack. In fact, about three quarters of all women with migraine report that their attacks are related to the menstrual cycle.  Q: Is there an increased risk of stroke  for migraine sufferers? A: The likelihood of a migraine attack causing a stroke is very remote. That is not to say that migraine sufferers cannot have a stroke associated with their migraines. In persons under age 15, the most common associated factor for stroke is migraine headache. But over the course of a person's normal life span, the occurrence of migraine headache may actually be associated with a reduced risk of dying from cerebrovascular disease due to stroke.  Q: What are acute medications for migraine? A: Acute medications  are used to treat the pain of the headache after it has started. Examples over-the-counter medications, NSAIDs, ergots, and triptans.  Q: What are the triptans? A: Triptans are the newest class of abortive medications. They are specifically targeted to treat migraine. Triptans are vasoconstrictors. They moderate some chemical reactions in the brain. The triptans work on receptors in your brain. Triptans help to restore the balance of a neurotransmitter called serotonin. Fluctuations in levels of serotonin are thought to be a main cause of migraine.  Q: Are over-the-counter medications for migraine effective? A: Over-the-counter, or "OTC," medications may be effective in relieving mild to moderate pain and associated symptoms of migraine. But you should see your caregiver before beginning any treatment regimen for migraine.  Q: What are preventive medications for migraine? A: Preventive medications for migraine are sometimes referred to as "prophylactic" treatments. They are used to reduce the frequency, severity, and length of migraine attacks. Examples of preventive medications include antiepileptic medications, antidepressants, beta-blockers, calcium channel blockers, and NSAIDs (nonsteroidal anti-inflammatory drugs). Q: Why are anticonvulsants used to treat migraine? A: During the past few years, there has been an increased interest in antiepileptic drugs for the prevention of migraine. They are sometimes referred to as "anticonvulsants". Both epilepsy and migraine may be caused by similar reactions in the brain.  Q: Why are antidepressants used to treat migraine? A: Antidepressants are typically used to treat people with depression. They may reduce migraine frequency by regulating chemical levels, such as serotonin, in the brain.  Q: What alternative therapies are used to treat migraine? A: The term "alternative therapies" is often used to describe treatments considered outside the scope of  conventional Western medicine. Examples of alternative therapy include acupuncture, acupressure, and yoga. Another common alternative treatment is herbal therapy. Some herbs are believed to relieve headache pain. Always discuss alternative therapies with your caregiver before proceeding. Some herbal products contain arsenic and other toxins. TENSION HEADACHES Q: What is a tension-type headache? What causes it? How can I treat it? A: Tension-type headaches occur randomly. They are often the result of temporary stress, anxiety, fatigue, or anger. Symptoms include soreness in your temples, a tightening band-like sensation around your head (a "vice-like" ache). Symptoms can also include a pulling feeling, pressure sensations, and contracting head and neck muscles. The headache begins in your forehead, temples, or the back of your head and neck. Treatment for tension-type headache may include over-the-counter or prescription medications. Treatment may also include self-help techniques such as relaxation training and biofeedback. CLUSTER HEADACHES Q: What is a cluster headache? What causes it? How can I treat it? A: Cluster headache gets its name because the attacks come in groups. The pain arrives with little, if any, warning. It is usually on one side of the head. A tearing or bloodshot eye and a runny nose on the same side of the headache may also accompany the pain. Cluster headaches are believed to be caused by chemical reactions in the brain. They  have been described as the most severe and intense of any headache type. Treatment for cluster headache includes prescription medication and oxygen. SINUS HEADACHES Q: What is a sinus headache? What causes it? How can I treat it? A: When a cavity in the bones of the face and skull (a sinus) becomes inflamed, the inflammation will cause localized pain. This condition is usually the result of an allergic reaction, a tumor, or an infection. If your headache is caused  by a sinus blockage, such as an infection, you will probably have a fever. An x-ray will confirm a sinus blockage. Your caregiver's treatment might include antibiotics for the infection, as well as antihistamines or decongestants.  REBOUND HEADACHES Q: What is a rebound headache? What causes it? How can I treat it? A: A pattern of taking acute headache medications too often can lead to a condition known as "rebound headache." A pattern of taking too much headache medication includes taking it more than 2 days per week or in excessive amounts. That means more than the label or a caregiver advises. With rebound headaches, your medications not only stop relieving pain, they actually begin to cause headaches. Doctors treat rebound headache by tapering the medication that is being overused. Sometimes your caregiver will gradually substitute a different type of treatment or medication. Stopping may be a challenge. Regularly overusing a medication increases the potential for serious side effects. Consult a caregiver if you regularly use headache medications more than 2 days per week or more than the label advises. ADDITIONAL QUESTIONS AND ANSWERS Q: What is biofeedback? A: Biofeedback is a self-help treatment. Biofeedback uses special equipment to monitor your body's involuntary physical responses. Biofeedback monitors:  Breathing.  Pulse.  Heart rate.  Temperature.  Muscle tension.  Brain activity. Biofeedback helps you refine and perfect your relaxation exercises. You learn to control the physical responses that are related to stress. Once the technique has been mastered, you do not need the equipment any more. Q: Are headaches hereditary? A: Four out of five (80%) of people that suffer report a family history of migraine. Scientists are not sure if this is genetic or a family predisposition. Despite the uncertainty, a child has a 50% chance of having migraine if one parent suffers. The child has a  75% chance if both parents suffer.  Q: Can children get headaches? A: By the time they reach high school, most young people have experienced some type of headache. Many safe and effective approaches or medications can prevent a headache from occurring or stop it after it has begun.  Q: What type of doctor should I see to diagnose and treat my headache? A: Start with your primary caregiver. Discuss his or her experience and approach to headaches. Discuss methods of classification, diagnosis, and treatment. Your caregiver may decide to recommend you to a headache specialist, depending upon your symptoms or other physical conditions. Having diabetes, allergies, etc., may require a more comprehensive and inclusive approach to your headache. The National Headache Foundation will provide, upon request, a list of [facility]) physician members in your state. Document Released: 09/29/2003 Document Revised: 10/01/2011 Document Reviewed: 03/08/2008 [facility] Patient Information 2015 [place], [place]. This information is not intended to replace advice given to you by your health care provider. Make sure you discuss any questions you have with your health care provider.

## 2014-06-19 ENCOUNTER — Emergency Department (HOSPITAL_COMMUNITY)
Admission: EM | Admit: 2014-06-19 | Discharge: 2014-06-19 | Disposition: A | Discharge status: Home / Self Care | Payer: BC Managed Care – PPO | Attending: Emergency Medicine | Admitting: Emergency Medicine

## 2014-06-19 ENCOUNTER — Encounter (HOSPITAL_COMMUNITY): Payer: Self-pay | Admitting: *Deleted

## 2014-06-19 DIAGNOSIS — I159 Secondary hypertension, unspecified: Secondary | ICD-10-CM | POA: Insufficient documentation

## 2014-06-19 DIAGNOSIS — J069 Acute upper respiratory infection, unspecified: Secondary | ICD-10-CM | POA: Insufficient documentation

## 2014-06-19 DIAGNOSIS — B3731 Acute candidiasis of vulva and vagina: Secondary | ICD-10-CM

## 2014-06-19 DIAGNOSIS — B373 Candidiasis of vulva and vagina: Secondary | ICD-10-CM

## 2014-06-19 DIAGNOSIS — Z720 Tobacco use: Secondary | ICD-10-CM | POA: Insufficient documentation

## 2014-06-19 MED ORDER — LISINOPRIL-HYDROCHLOROTHIAZIDE 20-12.5 MG PO TABS: 1.0000 | ORAL_TABLET | Freq: Every day | ORAL | Status: DC

## 2014-06-19 MED ORDER — FLUCONAZOLE 200 MG PO TABS: 200.0000 mg | ORAL_TABLET | Freq: Every day | ORAL | Status: AC

## 2014-06-19 NOTE — Discharge Instructions (Signed)
Upper Respiratory Infection, Adult An upper respiratory infection (URI) is also sometimes known as the common cold. The upper respiratory tract includes the nose, sinuses, throat, trachea, and bronchi. Bronchi are the airways leading to the lungs. Most people improve within 1 week, but symptoms can last up to 2 weeks. A residual cough may last even longer.  CAUSES Many different viruses can infect the tissues lining the upper respiratory tract. The tissues become irritated and inflamed and often become very moist. Mucus production is also common. A cold is contagious. You can easily spread the virus to others by oral contact. This includes kissing, sharing a glass, coughing, or sneezing. Touching your mouth or nose and then touching a surface, which is then touched by another person, can also spread the virus. SYMPTOMS  Symptoms typically develop 1 to 3 days after you come in contact with a cold virus. Symptoms vary from person to person. They may include:  Runny nose.  Sneezing.  Nasal congestion.  Sinus irritation.  Sore throat.  Loss of voice (laryngitis).  Cough.  Fatigue.  Muscle aches.  Loss of appetite.  Headache.  Low-grade fever. DIAGNOSIS  You might diagnose your own cold based on familiar symptoms, since most people get a cold 2 to 3 times a year. Your caregiver can confirm this based on your exam. Most importantly, your caregiver can check that your symptoms are not due to another disease such as strep throat, sinusitis, pneumonia, asthma, or epiglottitis. Blood tests, throat tests, and X-rays are not necessary to diagnose a common cold, but they may sometimes be helpful in excluding other more serious diseases. Your caregiver will decide if any further tests are required. RISKS AND COMPLICATIONS  You may be at risk for a more severe case of the common cold if you smoke cigarettes, have chronic heart disease (such as heart failure) or lung disease (such as asthma), or if  you have a weakened immune system. The very young and very old are also at risk for more serious infections. Bacterial sinusitis, middle ear infections, and bacterial pneumonia can complicate the common cold. The common cold can worsen asthma and chronic obstructive pulmonary disease (COPD). Sometimes, these complications can require emergency medical care and may be life-threatening. PREVENTION  The best way to protect against getting a cold is to practice good hygiene. Avoid oral or hand contact with people with cold symptoms. Wash your hands often if contact occurs. There is no clear evidence that vitamin C, vitamin E, echinacea, or exercise reduces the chance of developing a cold. However, it is always recommended to get plenty of rest and practice good nutrition. TREATMENT  Treatment is directed at relieving symptoms. There is no cure. Antibiotics are not effective, because the infection is caused by a virus, not by bacteria. Treatment may include:  Increased fluid intake. Sports drinks offer valuable electrolytes, sugars, and fluids.  Breathing heated mist or steam (vaporizer or shower).  Eating chicken soup or other clear broths, and maintaining good nutrition.  Getting plenty of rest.  Using gargles or lozenges for comfort.  Controlling fevers with ibuprofen or acetaminophen as directed by your caregiver.  Increasing usage of your inhaler if you have asthma. Zinc gel and zinc lozenges, taken in the first 24 hours of the common cold, can shorten the duration and lessen the severity of symptoms. Pain medicines may help with fever, muscle aches, and throat pain. A variety of non-prescription medicines are available to treat congestion and runny nose. Your caregiver  can make recommendations and may suggest nasal or lung inhalers for other symptoms.  HOME CARE INSTRUCTIONS   Only take over-the-counter or prescription medicines for pain, discomfort, or fever as directed by your  caregiver.  Use a warm mist humidifier or inhale steam from a shower to increase air moisture. This may keep secretions moist and make it easier to breathe.  Drink enough water and fluids to keep your urine clear or pale yellow.  Rest as needed.  Return to work when your temperature has returned to normal or as your caregiver advises. You may need to stay home longer to avoid infecting others. You can also use a face mask and careful hand washing to prevent spread of the virus. SEEK MEDICAL CARE IF:   After the first few days, you feel you are getting worse rather than better.  You need your caregiver's advice about medicines to control symptoms.  You develop chills, worsening shortness of breath, or brown or red sputum. These may be signs of pneumonia.  You develop yellow or brown nasal discharge or pain in the face, especially when you bend forward. These may be signs of sinusitis.  You develop a fever, swollen neck glands, pain with swallowing, or white areas in the back of your throat. These may be signs of strep throat. SEEK IMMEDIATE MEDICAL CARE IF:   You have a fever.  You develop severe or persistent headache, ear pain, sinus pain, or chest pain.  You develop wheezing, a prolonged cough, cough up blood, or have a change in your usual mucus (if you have chronic lung disease).  You develop sore muscles or a stiff neck. Document Released: 01/02/2001 Document Revised: 10/01/2011 Document Reviewed: 10/14/2013 [facility] Patient Information 2015 [place], [place]. This information is not intended to replace advice given to you by your health care provider. Make sure you discuss any questions you have with your health care provider. Monilial Vaginitis Vaginitis in a soreness, swelling and redness (inflammation) of the vagina and vulva. Monilial vaginitis is not a sexually transmitted infection. CAUSES  Yeast vaginitis is caused by yeast (candida) that is normally found in your vagina.  With a yeast infection, the candida has overgrown in number to a point that upsets the chemical balance. SYMPTOMS   White, thick vaginal discharge.  Swelling, itching, redness and irritation of the vagina and possibly the lips of the vagina (vulva).  Burning or painful urination.  Painful intercourse. DIAGNOSIS  Things that may contribute to monilial vaginitis are:  Postmenopausal and virginal states.  Pregnancy.  Infections.  Being tired, sick or stressed, especially if you had monilial vaginitis in the past.  Diabetes. Good control will help lower the chance.  Birth control pills.  Tight fitting garments.  Using bubble bath, feminine sprays, douches or deodorant tampons.  Taking certain medications that kill germs (antibiotics).  Sporadic recurrence can occur if you become ill. TREATMENT  Your caregiver will give you medication.  There are several kinds of anti monilial vaginal creams and suppositories specific for monilial vaginitis. For recurrent yeast infections, use a suppository or cream in the vagina 2 times a week, or as directed.  Anti-monilial or steroid cream for the itching or irritation of the vulva may also be used. Get your caregiver's permission.  Painting the vagina with methylene blue solution may help if the monilial cream does not work.  Eating yogurt may help prevent monilial vaginitis. HOME CARE INSTRUCTIONS   Finish all medication as prescribed.  Do not have sex  until treatment is completed or after your caregiver tells you it is okay.  Take warm sitz baths.  Do not douche.  Do not use tampons, especially scented ones.  Wear cotton underwear.  Avoid tight pants and panty hose.  Tell your sexual partner that you have a yeast infection. They should go to their caregiver if they have symptoms such as mild rash or itching.  Your sexual partner should be treated as well if your infection is difficult to eliminate.  Practice safer sex.  Use condoms.  Some vaginal medications cause latex condoms to fail. Vaginal medications that harm condoms are:  Cleocin cream.  Butoconazole (Femstat).  Terconazole (Terazol) vaginal suppository.  Miconazole (Monistat) (may be purchased over the counter). SEEK MEDICAL CARE IF:   You have a temperature by mouth above 102 F (38.9 C).  The infection is getting worse after 2 days of treatment.  The infection is not getting better after 3 days of treatment.  You develop blisters in or around your vagina.  You develop vaginal bleeding, and it is not your menstrual period.  You have pain when you urinate.  You develop intestinal problems.  You have pain with sexual intercourse. Document Released: 04/18/2005 Document Revised: 10/01/2011 Document Reviewed: 12/31/2008 [facility] Patient Information 2015 [place], [place]. This information is not intended to replace advice given to you by your health care provider. Make sure you discuss any questions you have with your health care provider.

## 2014-06-19 NOTE — ED Provider Notes (Signed)
CSN: [id number]     Arrival date & time 06/19/14  1414 History   First MD Initiated Contact with Patient 06/19/14 1455     Chief Complaint  Patient presents with  . Headache  . Hypertension  . Nasal Congestion  . Vaginal Discharge      The history is provided by the patient.   patient reports upper respiratory symptoms over the past several days.  She's had productive cough without shortness of breath.  She reports no orthopnea.  She reports nasal congestion and sinus pressure.  Denies fevers or chills.  She also reports a creamy white itchy vaginal discharge it feels like her prior yeast infections.  She also reports high blood pressure and is currently out of her blood pressure medicines.  She is developing a relationship with a primary care physician as she is getting insurance shortly but is requesting a refill of her hydrochlorothiazide-lisinopril.  Denies headache.  No chest pain shortness breath.    Past Medical History  Diagnosis Date  . Hypertension    Past Surgical History  Procedure Laterality Date  . Appendectomy    . Cesarean section    . Tubal ligation     Family History  Problem Relation Age of Onset  . Heart failure Mother   . Diabetes Mother    History  Substance Use Topics  . Smoking status: Current Every Day Smoker -- 0.50 packs/day    Types: Cigarettes  . Smokeless tobacco: Never Used  . Alcohol Use: No   OB History    No data available     Review of Systems  All other systems reviewed and are negative.     Allergies  Codeine and Food  Home Medications   Prior to Admission medications   Medication Sig Start Date End Date Taking? Authorizing Provider  Aspirin-Salicylamide-Caffeine (BC HEADACHE POWDER PO) Take 2 packets by mouth 3 (three) times daily as needed (pain).    Yes Historical Provider, MD  Chlorphen-PE-Acetaminophen (SINUS CONGESTION/PAIN NIGHT PO) Take 1 tablet by mouth daily as needed (congestion).   Yes Historical Provider, MD   fluconazole (DIFLUCAN) 200 MG tablet Take 1 tablet (200 mg total) by mouth daily. 06/19/14 06/26/14  Hoy Morn, MD  lisinopril-hydrochlorothiazide (PRINZIDE,ZESTORETIC) 20-12.5 MG per tablet Take 1 tablet by mouth daily. 06/19/14   Hoy Morn, MD   BP 188/90 mmHg  Pulse 68  Temp(Src) 98.3 F (36.8 C)  Resp 22  SpO2 98% Physical Exam  Constitutional: She is oriented to person, place, and time. She appears well-developed and well-nourished. No distress.  HENT:  Head: Normocephalic and atraumatic.  Eyes: EOM are normal.  Neck: Normal range of motion.  Cardiovascular: Normal rate, regular rhythm and normal heart sounds.   Pulmonary/Chest: Effort normal and breath sounds normal. No respiratory distress. She has no wheezes.  Abdominal: Soft. She exhibits no distension. There is no tenderness.  Musculoskeletal: Normal range of motion.  Neurological: She is alert and oriented to person, place, and time.  Skin: Skin is warm and dry.  Psychiatric: She has a normal mood and affect. Judgment normal.  Nursing note and vitals reviewed.   ED Course  Procedures (including critical care time) Labs Review Labs Reviewed - No data to display  Imaging Review No results found.   EKG Interpretation None      MDM   Final diagnoses:  Secondary hypertension, unspecified  Upper respiratory tract infection  Yeast infection of the vagina    Clinically this sounds  like a yeast infection.  I will treat empirically.  The patient has had yeast infections before.  Suspect upper respiratory tract infection.  No hypoxia or increased work of breathing.  No need for x-ray at this time.  No need for antibiotics.  Patient refilled her blood pressure medicines    Hoy Morn, MD 06/19/14 1513

## 2014-06-19 NOTE — ED Notes (Signed)
Pt reports "sinus infection", high blood pressure, yeast infection and vaginal discharge, nasal congestion and headache

## 2014-11-06 ENCOUNTER — Encounter (HOSPITAL_COMMUNITY): Payer: Self-pay | Admitting: Emergency Medicine

## 2014-11-06 ENCOUNTER — Emergency Department (HOSPITAL_COMMUNITY)
Admission: EM | Admit: 2014-11-06 | Discharge: 2014-11-06 | Disposition: A | Discharge status: Home / Self Care | Payer: Medicaid Other | Attending: Emergency Medicine | Admitting: Emergency Medicine

## 2014-11-06 DIAGNOSIS — Z720 Tobacco use: Secondary | ICD-10-CM | POA: Insufficient documentation

## 2014-11-06 DIAGNOSIS — Z79899 Other long term (current) drug therapy: Secondary | ICD-10-CM | POA: Insufficient documentation

## 2014-11-06 DIAGNOSIS — I10 Essential (primary) hypertension: Secondary | ICD-10-CM

## 2014-11-06 DIAGNOSIS — J3489 Other specified disorders of nose and nasal sinuses: Secondary | ICD-10-CM | POA: Insufficient documentation

## 2014-11-06 LAB — I-STAT CHEM 8, ED
BUN: 20 mg/dL (ref 6–23)
CREATININE: 1 mg/dL (ref 0.50–1.10)
Calcium, Ion: 1.18 mmol/L (ref 1.12–1.23)
Chloride: 109 mmol/L (ref 96–112)
Glucose, Bld: 78 mg/dL (ref 70–99)
HEMATOCRIT: 42 % (ref 36.0–46.0)
Hemoglobin: 14.3 g/dL (ref 12.0–15.0)
POTASSIUM: 3.8 mmol/L (ref 3.5–5.1)
Sodium: 144 mmol/L (ref 135–145)
TCO2: 22 mmol/L (ref 0–100)

## 2014-11-06 MED ORDER — LISINOPRIL-HYDROCHLOROTHIAZIDE 20-12.5 MG PO TABS: 1.0000 | ORAL_TABLET | Freq: Every day | ORAL | Status: DC

## 2014-11-06 MED ORDER — FLUTICASONE PROPIONATE 50 MCG/ACT NA SUSP: 1.0000 | Freq: Every day | NASAL | Status: DC

## 2014-11-06 MED ORDER — CETIRIZINE HCL 10 MG PO TABS: 10.0000 mg | ORAL_TABLET | Freq: Every day | ORAL | Status: DC

## 2014-11-06 NOTE — ED Provider Notes (Signed)
CSN: [id number]     Arrival date & time 11/06/14  1007 History   First MD Initiated Contact with Patient 11/06/14 1026     Chief Complaint  Patient presents with  . Hypertension    out of BP medications x3 weeks  . Headache    x2 weeks, frontal, constant  . Dizziness    intermittent x2 weeks     (Consider location/radiation/quality/duration/timing/severity/associated sxs/prior Treatment) HPI Comments: 54 yo female with known hypertension presenting with frontal headache x 1 week, elevated blood pressure, nasal congestion and nonproductive cough.  Pt does not have a PCP, has been taking Lisinipril/HCTZ that was prescribed by the ED physician, however not taken in the past 2-3 weeks.  Pt tells me headache correlates with elevated BP and usually resolves with "home remedies of pickle juice" and BC powders. Denies visual changes or  worst headache of her life.  Reports facial pressure, green nasal drainage that clears with NS nasal spray. No previous history of seasonal allergies or sinus infections.  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  Patient is 54 y.o. female presenting with hypertension, headaches, and dizziness. The history is provided by the patient.  Hypertension Associated symptoms include headaches.  Headache Associated symptoms: dizziness   Dizziness Associated symptoms: headaches     Past Medical History  Diagnosis Date  . Hypertension    Past Surgical History  Procedure Laterality Date  . Appendectomy    . Cesarean section    . Tubal ligation     Family History  Problem Relation Age of Onset  . Heart failure Mother   . Diabetes Mother    History  Substance Use Topics  . Smoking status: Current Every Day Smoker -- 0.50 packs/day    Types: Cigarettes  . Smokeless tobacco: Never Used  . Alcohol Use: No   OB History    No data available     Review of Systems  Neurological: Positive for dizziness and  headaches.  All other systems reviewed and are negative.     Allergies  Codeine and Food  Home Medications   Prior to Admission medications   Medication Sig Start Date End Date Taking? Authorizing Provider  lisinopril-hydrochlorothiazide (PRINZIDE,ZESTORETIC) 20-12.5 MG per tablet Take 1 tablet by mouth daily. 06/19/14  Yes Jola Schmidt, MD   BP 207/90 mmHg  Pulse 66  Temp(Src) 98.4 F (36.9 C) (Oral)  Resp 20  Ht 5\' 7"  (1.702 m)  Wt 223 lb (101.152 kg)  BMI 34.92 kg/m2  SpO2 100% Physical Exam  Constitutional: She is oriented to person, place, and time. She appears well-developed and well-nourished. She is cooperative. She appears toxic. No distress.  HENT:  Head: Normocephalic and atraumatic.  Right Ear: Tympanic membrane, external ear and ear canal normal.  Left Ear: Tympanic membrane, external ear and ear canal normal.  Nose: Mucosal edema and rhinorrhea present. Right sinus exhibits maxillary sinus tenderness and frontal sinus tenderness. Left sinus exhibits maxillary sinus tenderness and frontal sinus tenderness.  Mouth/Throat: Uvula is midline, oropharynx is clear and moist and mucous membranes are normal. No oropharyngeal exudate.  Eyes: Conjunctivae and EOM are normal. Pupils are equal, round, and reactive to light.  Neck: Normal range of motion. Neck supple. No thyromegaly present.  Cardiovascular: Normal rate, regular rhythm, normal heart sounds and intact distal pulses.   No murmur heard. Pulmonary/Chest: Effort normal and breath sounds normal. She has no wheezes.  Abdominal: Soft. Bowel sounds  are normal.  Musculoskeletal: Normal range of motion.  Lymphadenopathy:    She has no cervical adenopathy.  Neurological: She is alert and oriented to person, place, and time. She has normal strength. No cranial nerve deficit. Gait normal. GCS eye subscore is 4. GCS verbal subscore is 5. GCS motor subscore is 6.  Skin: Skin is warm, dry and intact.    ED Course   Procedures (including critical care time) Labs Review Labs Reviewed  I-STAT CHEM 8, ED    Imaging Review No results found.   EKG Interpretation None      MDM   Final diagnoses:  Essential hypertension  Sinus drainage   HTN- initial BP elevated, recheck 166/86 HR 58. Stable. Will refill Lisinopril/HCTZ and discussed follow up with a PCP to manage HTN and medical care Headache resolved prior to discharge.  Suspect headache is due to elevated BP and viral sinusitis Viral Rhinosinusitis- Will symptomaticall treat with Fluticasone and Zyrtec.    Glendell Docker, NP 11/06/14 [place], MD 11/06/14 [phone]

## 2014-11-06 NOTE — ED Notes (Signed)
Pt A+Ox4, c/o frontal HA, 8/10, x2 weeks.  Pt also c/o intermittent dizziness, intermittent SOB and feeling her BP is elevated.  Pt reports out of BP medications x3 weeks, doesn't have a PCP and got prescribed medications here last time for same.  Pt denies cp, denies SOB currently.  Speaking full/clear sentences, rr even/un-lab.  Neuros grossly intact.  Skin PWD.  MAEI.  NAD.

## 2014-11-06 NOTE — Discharge Instructions (Signed)
Hypertension Hypertension is another name for high blood pressure. High blood pressure forces your heart to work harder to pump blood. A blood pressure reading has two numbers, which includes a higher number over a lower number (example: 110/72). HOME CARE   Have your blood pressure rechecked by your doctor.  Only take medicine as told by your doctor. Follow the directions carefully. The medicine does not work as well if you skip doses. Skipping doses also puts you at risk for problems.  Do not smoke.  Monitor your blood pressure at home as told by your doctor. GET HELP IF:  You think you are having a reaction to the medicine you are taking.  You have repeat headaches or feel dizzy.  You have puffiness (swelling) in your ankles.  You have trouble with your vision. GET HELP RIGHT AWAY IF:   You get a very bad headache and are confused.  You feel weak, numb, or faint.  You get chest or belly (abdominal) pain.  You throw up (vomit).  You cannot breathe very well. MAKE SURE YOU:   Understand these instructions.  Will watch your condition.  Will get help right away if you are not doing well or get worse. Document Released: 12/26/2007 Document Revised: 07/14/2013 Document Reviewed: 05/01/2013 [employer] Patient Information 2015 [employer]. This information is not intended to replace advice given to you by your health care provider. Make sure you discuss any questions you have with your health care provider.  

## 2015-02-05 ENCOUNTER — Encounter (HOSPITAL_COMMUNITY): Payer: Self-pay | Admitting: Emergency Medicine

## 2015-02-05 ENCOUNTER — Emergency Department (HOSPITAL_COMMUNITY)
Admission: EM | Admit: 2015-02-05 | Discharge: 2015-02-05 | Disposition: A | Discharge status: Home / Self Care | Payer: Medicaid Other | Attending: Emergency Medicine | Admitting: Emergency Medicine

## 2015-02-05 DIAGNOSIS — Z720 Tobacco use: Secondary | ICD-10-CM | POA: Insufficient documentation

## 2015-02-05 DIAGNOSIS — J0100 Acute maxillary sinusitis, unspecified: Secondary | ICD-10-CM

## 2015-02-05 DIAGNOSIS — I10 Essential (primary) hypertension: Secondary | ICD-10-CM

## 2015-02-05 LAB — I-STAT CHEM 8, ED
BUN: 20 mg/dL (ref 6–20)
CALCIUM ION: 1.16 mmol/L (ref 1.12–1.23)
Chloride: 108 mmol/L (ref 101–111)
Creatinine, Ser: 0.7 mg/dL (ref 0.44–1.00)
Glucose, Bld: 91 mg/dL (ref 65–99)
HEMATOCRIT: 44 % (ref 36.0–46.0)
Hemoglobin: 15 g/dL (ref 12.0–15.0)
Potassium: 3.8 mmol/L (ref 3.5–5.1)
SODIUM: 142 mmol/L (ref 135–145)
TCO2: 21 mmol/L (ref 0–100)

## 2015-02-05 MED ORDER — AMOXICILLIN-POT CLAVULANATE 875-125 MG PO TABS: 1.0000 | ORAL_TABLET | Freq: Two times a day (BID) | ORAL | Status: DC

## 2015-02-05 MED ORDER — LISINOPRIL-HYDROCHLOROTHIAZIDE 20-12.5 MG PO TABS: 1.0000 | ORAL_TABLET | Freq: Every day | ORAL | Status: DC

## 2015-02-05 NOTE — ED Notes (Signed)
Awake. Verbally responsive. A/O x4. Resp even and unlabored. No audible adventitious breath sounds noted. ABC's intact.  

## 2015-02-05 NOTE — Discharge Instructions (Signed)
Hypertension °Hypertension, commonly called high blood pressure, is when the force of blood pumping through your arteries is too strong. Your arteries are the blood vessels that carry blood from your heart throughout your body. A blood pressure reading consists of a higher number over a lower number, such as 110/72. The higher number (systolic) is the pressure inside your arteries when your heart pumps. The lower number (diastolic) is the pressure inside your arteries when your heart relaxes. Ideally you want your blood pressure below 120/80. °Hypertension forces your heart to work harder to pump blood. Your arteries may become narrow or stiff. Having hypertension puts you at risk for heart disease, stroke, and other problems.  °RISK FACTORS °Some risk factors for high blood pressure are controllable. Others are not.  °Risk factors you cannot control include:  °· Race. You may be at higher risk if you are African American. °· Age. Risk increases with age. °· Gender. Men are at higher risk than women before age 45 years. After age 65, women are at higher risk than men. °Risk factors you can control include: °· Not getting enough exercise or physical activity. °· Being overweight. °· Getting too much fat, sugar, calories, or salt in your diet. °· Drinking too much alcohol. °SIGNS AND SYMPTOMS °Hypertension does not usually cause signs or symptoms. Extremely high blood pressure (hypertensive crisis) may cause headache, anxiety, shortness of breath, and nosebleed. °DIAGNOSIS  °To check if you have hypertension, your health care provider will measure your blood pressure while you are seated, with your arm held at the level of your heart. It should be measured at least twice using the same arm. Certain conditions can cause a difference in blood pressure between your right and left arms. A blood pressure reading that is higher than normal on one occasion does not mean that you need treatment. If one blood pressure reading  is high, ask your health care provider about having it checked again. °TREATMENT  °Treating high blood pressure includes making lifestyle changes and possibly taking medicine. Living a healthy lifestyle can help lower high blood pressure. You may need to change some of your habits. °Lifestyle changes may include: °· Following the DASH diet. This diet is high in fruits, vegetables, and whole grains. It is low in salt, red meat, and added sugars. °· Getting at least 2½ hours of brisk physical activity every week. °· Losing weight if necessary. °· Not smoking. °· Limiting alcoholic beverages. °· Learning ways to reduce stress. ° If lifestyle changes are not enough to get your blood pressure under control, your health care provider may prescribe medicine. You may need to take more than one. Work closely with your health care provider to understand the risks and benefits. °HOME CARE INSTRUCTIONS °· Have your blood pressure rechecked as directed by your health care provider.   °· Take medicines only as directed by your health care provider. Follow the directions carefully. Blood pressure medicines must be taken as prescribed. The medicine does not work as well when you skip doses. Skipping doses also puts you at risk for problems.   °· Do not smoke.   °· Monitor your blood pressure at home as directed by your health care provider.  °SEEK MEDICAL CARE IF:  °· You think you are having a reaction to medicines taken. °· You have recurrent headaches or feel dizzy. °· You have swelling in your ankles. °· You have trouble with your vision. °SEEK IMMEDIATE MEDICAL CARE IF: °· You develop a severe headache or confusion. °·   You have unusual weakness, numbness, or feel faint.  You have severe chest or abdominal pain.  You vomit repeatedly.  You have trouble breathing. MAKE SURE YOU:   Understand these instructions.  Will watch your condition.  Will get help right away if you are not doing well or get worse. Document  Released: 07/09/2005 Document Revised: 11/23/2013 Document Reviewed: 05/01/2013 [facility] Patient Information 2015 [place], [place]. This information is not intended to replace advice given to you by your health care provider. Make sure you discuss any questions you have with your health care provider.  Sinusitis Sinusitis is redness, soreness, and inflammation of the paranasal sinuses. Paranasal sinuses are air pockets within the bones of your face (beneath the eyes, the middle of the forehead, or above the eyes). In healthy paranasal sinuses, mucus is able to drain out, and air is able to circulate through them by way of your nose. However, when your paranasal sinuses are inflamed, mucus and air can become trapped. This can allow bacteria and other germs to grow and cause infection. Sinusitis can develop quickly and last only a short time (acute) or continue over a long period (chronic). Sinusitis that lasts for more than 12 weeks is considered chronic.  CAUSES  Causes of sinusitis include:  Allergies.  Structural abnormalities, such as displacement of the cartilage that separates your nostrils (deviated septum), which can decrease the air flow through your nose and sinuses and affect sinus drainage.  Functional abnormalities, such as when the small hairs (cilia) that line your sinuses and help remove mucus do not work properly or are not present. SIGNS AND SYMPTOMS  Symptoms of acute and chronic sinusitis are the same. The primary symptoms are pain and pressure around the affected sinuses. Other symptoms include:  Upper toothache.  Earache.  Headache.  Bad breath.  Decreased sense of smell and taste.  A cough, which worsens when you are lying flat.  Fatigue.  Fever.  Thick drainage from your nose, which often is green and may contain pus (purulent).  Swelling and warmth over the affected sinuses. DIAGNOSIS  Your health care provider will perform a physical exam. During the exam,  your health care provider may:  Look in your nose for signs of abnormal growths in your nostrils (nasal polyps).  Tap over the affected sinus to check for signs of infection.  View the inside of your sinuses (endoscopy) using an imaging device that has a light attached (endoscope). If your health care provider suspects that you have chronic sinusitis, one or more of the following tests may be recommended:  Allergy tests.  Nasal culture. A sample of mucus is taken from your nose, sent to a lab, and screened for bacteria.  Nasal cytology. A sample of mucus is taken from your nose and examined by your health care provider to determine if your sinusitis is related to an allergy. TREATMENT  Most cases of acute sinusitis are related to a viral infection and will resolve on their own within 10 days. Sometimes medicines are prescribed to help relieve symptoms (pain medicine, decongestants, nasal steroid sprays, or saline sprays).  However, for sinusitis related to a bacterial infection, your health care provider will prescribe antibiotic medicines. These are medicines that will help kill the bacteria causing the infection.  Rarely, sinusitis is caused by a fungal infection. In theses cases, your health care provider will prescribe antifungal medicine. For some cases of chronic sinusitis, surgery is needed. Generally, these are cases in which sinusitis recurs more  than 3 times per year, despite other treatments. HOME CARE INSTRUCTIONS   Drink plenty of water. Water helps thin the mucus so your sinuses can drain more easily.  Use a humidifier.  Inhale steam 3 to 4 times a day (for example, sit in the bathroom with the shower running).  Apply a warm, moist washcloth to your face 3 to 4 times a day, or as directed by your health care provider.  Use saline nasal sprays to help moisten and clean your sinuses.  Take medicines only as directed by your health care provider.  If you were prescribed  either an antibiotic or antifungal medicine, finish it all even if you start to feel better. SEEK IMMEDIATE MEDICAL CARE IF:  You have increasing pain or severe headaches.  You have nausea, vomiting, or drowsiness.  You have swelling around your face.  You have vision problems.  You have a stiff neck.  You have difficulty breathing. MAKE SURE YOU:   Understand these instructions.  Will watch your condition.  Will get help right away if you are not doing well or get worse. Document Released: 07/09/2005 Document Revised: 11/23/2013 Document Reviewed: 07/24/2011 [facility] Patient Information 2015 [place], [place]. This information is not intended to replace advice given to you by your health care provider. Make sure you discuss any questions you have with your health care provider.

## 2015-02-05 NOTE — ED Notes (Signed)
Awake. Verbally responsive. A/O x4. Resp even and unlabored. No audible adventitious breath sounds noted. ABC's intact. Family at bedside. 

## 2015-02-05 NOTE — ED Provider Notes (Signed)
CSN: [id number]     Arrival date & time 02/05/15  1256 History   First MD Initiated Contact with Patient 02/05/15 1324     Chief Complaint  Patient presents with  . Hypertension     Patient is a 54 y.o. female presenting with hypertension. The history is provided by the patient. No language interpreter was used.  Hypertension   Ms. Fritts presents for medication refill. She states that she has been out of her blood pressure medication for the last 10 days and now she feels intermittent dizziness and nausea. She reports that this is related to be out of the medication. When she takes her medication she does not have these symptoms. She has a dull left-sided headache as well as nasal congestion and sinus pressure. She is concerned that she also had a sinus infection. She reports occasional cough. No chest pain, shortness of breath, fevers, vomiting, numbness, weakness. Symptoms are moderate, waxing and waning, worsening.  Past Medical History  Diagnosis Date  . Hypertension    Past Surgical History  Procedure Laterality Date  . Appendectomy    . Cesarean section    . Tubal ligation     Family History  Problem Relation Age of Onset  . Heart failure Mother   . Diabetes Mother    History  Substance Use Topics  . Smoking status: Current Every Day Smoker -- 0.50 packs/day    Types: Cigarettes  . Smokeless tobacco: Never Used  . Alcohol Use: No   OB History    No data available     Review of Systems  All other systems reviewed and are negative.     Allergies  Codeine and Food  Home Medications   Prior to Admission medications   Medication Sig Start Date End Date Taking? Authorizing Provider  Aspirin-Salicylamide-Caffeine (BC HEADACHE POWDER PO) Take 1 Package by mouth every 4 (four) hours as needed (for pain).   Yes Historical Provider, MD  cetirizine (ZYRTEC) 10 MG tablet Take 1 tablet (10 mg total) by mouth daily. Patient not taking: Reported on 02/05/2015 11/06/14    Glendell Docker, NP  fluticasone (FLONASE) 50 MCG/ACT nasal spray Place 1 spray into both nostrils daily. Patient not taking: Reported on 02/05/2015 11/06/14   Glendell Docker, NP  lisinopril-hydrochlorothiazide (PRINZIDE) 20-12.5 MG per tablet Take 1 tablet by mouth daily. Patient not taking: Reported on 02/05/2015 11/06/14   Glendell Docker, NP  lisinopril-hydrochlorothiazide (PRINZIDE,ZESTORETIC) 20-12.5 MG per tablet Take 1 tablet by mouth daily. Patient not taking: Reported on 02/05/2015 06/19/14   Jola Schmidt, MD   BP 182/87 mmHg  Pulse 63  Temp(Src) 98.6 F (37 C) (Oral)  Resp 16  Ht 5\' 6"  (1.676 m)  Wt 222 lb (100.699 kg)  BMI 35.85 kg/m2  SpO2 99% Physical Exam  Constitutional: She is oriented to person, place, and time. She appears well-developed and well-nourished.  HENT:  Head: Normocephalic and atraumatic.  Nasal congestion and boggy nasal mucosa  Eyes: EOM are normal. Pupils are equal, round, and reactive to light.  Cardiovascular: Normal rate and regular rhythm.   No murmur heard. Pulmonary/Chest: Effort normal and breath sounds normal. No respiratory distress.  Abdominal: Soft. There is no tenderness. There is no rebound and no guarding.  Musculoskeletal: She exhibits no edema or tenderness.  Neurological: She is alert and oriented to person, place, and time. No cranial nerve deficit.  Skin: Skin is warm and dry.  Psychiatric: She has a normal mood and affect. Her behavior is  normal.  Nursing note and vitals reviewed.   ED Course  Procedures (including critical care time) Labs Review Labs Reviewed  I-STAT CHEM 8, ED    Imaging Review No results found.   EKG Interpretation None      MDM   Final diagnoses:  Essential hypertension  Acute maxillary sinusitis, recurrence not specified    Patient here for medication refill, nasal congestion. She has no focal neurologic deficits and no current headache, she states she took medication prior to ED  arrival and that she does not want anything currently. Discussed patient and care for hypertension, need for PCP follow-up. Also discussed sinusitis. Presentation is not consistent with hypertensive urgency, CVA, subarachnoid hemorrhage.  Quintella Reichert, MD 02/05/15 1536

## 2015-02-05 NOTE — ED Notes (Signed)
Pt reported having elevated BP's all week. Pt denies checking BP but felt blurred vision, dizzy and n/v. Pt out of prescription and no money to afford BP medication. Reported sinus infection d/t yellowish mucus from nose.

## 2015-05-15 ENCOUNTER — Encounter (HOSPITAL_COMMUNITY): Payer: Self-pay | Admitting: *Deleted

## 2015-05-15 ENCOUNTER — Emergency Department (HOSPITAL_COMMUNITY)
Admission: EM | Admit: 2015-05-15 | Discharge: 2015-05-15 | Disposition: A | Discharge status: Home / Self Care | Payer: Medicaid Other | Attending: Emergency Medicine | Admitting: Emergency Medicine

## 2015-05-15 DIAGNOSIS — Z79899 Other long term (current) drug therapy: Secondary | ICD-10-CM | POA: Insufficient documentation

## 2015-05-15 DIAGNOSIS — Z3202 Encounter for pregnancy test, result negative: Secondary | ICD-10-CM | POA: Insufficient documentation

## 2015-05-15 DIAGNOSIS — H53149 Visual discomfort, unspecified: Secondary | ICD-10-CM | POA: Insufficient documentation

## 2015-05-15 DIAGNOSIS — I10 Essential (primary) hypertension: Secondary | ICD-10-CM | POA: Insufficient documentation

## 2015-05-15 DIAGNOSIS — Z720 Tobacco use: Secondary | ICD-10-CM | POA: Insufficient documentation

## 2015-05-15 LAB — I-STAT CHEM 8, ED
BUN: 16 mg/dL (ref 6–20)
CHLORIDE: 107 mmol/L (ref 101–111)
CREATININE: 0.8 mg/dL (ref 0.44–1.00)
Calcium, Ion: 1.13 mmol/L (ref 1.12–1.23)
Glucose, Bld: 92 mg/dL (ref 65–99)
HEMATOCRIT: 43 % (ref 36.0–46.0)
HEMOGLOBIN: 14.6 g/dL (ref 12.0–15.0)
POTASSIUM: 4.1 mmol/L (ref 3.5–5.1)
Sodium: 142 mmol/L (ref 135–145)
TCO2: 24 mmol/L (ref 0–100)

## 2015-05-15 LAB — I-STAT BETA HCG BLOOD, ED (MC, WL, AP ONLY): I-stat hCG, quantitative: <5 m[IU]/mL (ref <5)

## 2015-05-15 MED ORDER — HYDROCHLOROTHIAZIDE 12.5 MG PO CAPS
12.5000 mg | ORAL_CAPSULE | Freq: Once | ORAL | Status: AC
  Administered 2015-05-15: 12.5 mg via ORAL
  Filled 2015-05-15: qty 1

## 2015-05-15 MED ORDER — ACETAMINOPHEN 500 MG PO TABS
1000.0000 mg | ORAL_TABLET | Freq: Once | ORAL | Status: AC
  Administered 2015-05-15: 1000 mg via ORAL
  Filled 2015-05-15: qty 2

## 2015-05-15 MED ORDER — LISINOPRIL-HYDROCHLOROTHIAZIDE 20-12.5 MG PO TABS: 1.0000 | ORAL_TABLET | Freq: Every day | ORAL | Status: DC

## 2015-05-15 MED ORDER — LISINOPRIL 20 MG PO TABS
20.0000 mg | ORAL_TABLET | Freq: Once | ORAL | Status: AC
  Administered 2015-05-15: 20 mg via ORAL
  Filled 2015-05-15: qty 1

## 2015-05-15 NOTE — ED Provider Notes (Signed)
CSN: [id number]     Arrival date & time 05/15/15  8676 History   First MD Initiated Contact with Patient 05/15/15 1013     Chief Complaint  Patient presents with  . Dizziness     (Consider location/radiation/quality/duration/timing/severity/associated sxs/prior Treatment) Patient is a 54 y.o. female presenting with headaches.  Headache Pain location:  Frontal Quality:  Dull Radiates to:  Does not radiate Severity currently:  6/10 Onset quality:  Gradual Duration:  8 weeks Timing:  Intermittent Progression:  Worsening Chronicity:  New Similar to prior headaches: yes   Context comment:  Ran out of blood pressure medicine.  Pt reports similar symptosm when BP has been elevated in the past. Relieved by:  Nothing Worsened by:  Nothing Associated symptoms: dizziness (described as having to take a few moments when she stands up or is changing activities quickly.) and photophobia   Associated symptoms comment:  No chest pain, no shortness of breath   Past Medical History  Diagnosis Date  . Hypertension    Past Surgical History  Procedure Laterality Date  . Appendectomy    . Cesarean section    . Tubal ligation     Family History  Problem Relation Age of Onset  . Heart failure Mother   . Diabetes Mother    Social History  Substance Use Topics  . Smoking status: Current Every Day Smoker -- 0.50 packs/day    Types: Cigarettes  . Smokeless tobacco: Never Used  . Alcohol Use: No   OB History    No data available     Review of Systems  Eyes: Positive for photophobia.  Neurological: Positive for dizziness (described as having to take a few moments when she stands up or is changing activities quickly.) and headaches.  All other systems reviewed and are negative.     Allergies  Codeine and Food  Home Medications   Prior to Admission medications   Medication Sig Start Date End Date Taking? Authorizing Provider  Aspirin-Salicylamide-Caffeine (BC HEADACHE POWDER PO)  Take 1 Package by mouth every 4 (four) hours as needed (for pain).   Yes Historical Provider, MD  lisinopril-hydrochlorothiazide (PRINZIDE) 20-12.5 MG per tablet Take 1 tablet by mouth daily. 02/05/15  Yes Quintella Reichert, MD  amoxicillin-clavulanate (AUGMENTIN) 875-125 MG per tablet Take 1 tablet by mouth 2 (two) times daily. 02/05/15   Quintella Reichert, MD  cetirizine (ZYRTEC) 10 MG tablet Take 1 tablet (10 mg total) by mouth daily. Patient not taking: Reported on 02/05/2015 11/06/14   Glendell Docker, NP  fluticasone (FLONASE) 50 MCG/ACT nasal spray Place 1 spray into both nostrils daily. Patient not taking: Reported on 02/05/2015 11/06/14   Glendell Docker, NP   BP 201/86 mmHg  Pulse 66  Temp(Src) 97.8 F (36.6 C) (Oral)  Resp 20  SpO2 100% Physical Exam  Constitutional: She is oriented to person, place, and time. She appears well-developed and well-nourished. No distress.  HENT:  Head: Normocephalic and atraumatic.  Mouth/Throat: Oropharynx is clear and moist.  Eyes: Conjunctivae are normal. Pupils are equal, round, and reactive to light. No scleral icterus.  No papilledema  Neck: Neck supple.  Cardiovascular: Normal rate, regular rhythm, normal heart sounds and intact distal pulses.   No murmur heard. Pulmonary/Chest: Effort normal and breath sounds normal. No stridor. No respiratory distress. She has no rales.  Abdominal: Soft. Bowel sounds are normal. She exhibits no distension. There is no tenderness.  Musculoskeletal: Normal range of motion.  Neurological: She is alert and oriented to person,  place, and time. She has normal strength. No cranial nerve deficit or sensory deficit. Coordination and gait normal. GCS eye subscore is 4. GCS verbal subscore is 5. GCS motor subscore is 6.  Skin: Skin is warm and dry. No rash noted.  Psychiatric: She has a normal mood and affect. Her behavior is normal.  Nursing note and vitals reviewed.   ED Course  Procedures (including critical care  time) Labs Review Labs Reviewed  I-STAT CHEM 8, ED  I-STAT BETA HCG BLOOD, ED (MC, WL, AP ONLY)    Imaging Review No results found. I have personally reviewed and evaluated these images and lab results as part of my medical decision-making.   EKG Interpretation   Date/Time:  Sunday May 15 2015 10:18:48 EDT Ventricular Rate:  58 PR Interval:  152 QRS Duration: 95 QT Interval:  402 QTC Calculation: 395 R Axis:   27 Text Interpretation:  Sinus rhythm Baseline wander in lead(s) V1 V6  nonspecific t wave abnormality improved Confirmed by [facility]  MD, TREY  ([id number]) on 05/15/2015 11:47:40 AM      MDM   Final diagnoses:  Essential hypertension    54 yo female with elevated blood pressures which she believes is causing headaches and intermittent dizziness.  She has been seen for similar problems in the past.  Her headaches are not consistent with SAH or Meningitis.    She also has no signs or symptoms of end organ damage from her HTN.  Will refill her Lisinopril / HCTZ.  She was strongly advised to get a primary doctor for further treatment.    Serita Grit, MD 05/15/15 [phone]

## 2015-05-15 NOTE — ED Notes (Signed)
Awake. Verbally responsive. A/O x4. Resp even and unlabored. No audible adventitious breath sounds noted. ABC's intact.  

## 2015-05-15 NOTE — Discharge Instructions (Signed)
Hypertension Hypertension, commonly called high blood pressure, is when the force of blood pumping through your arteries is too strong. Your arteries are the blood vessels that carry blood from your heart throughout your body. A blood pressure reading consists of a higher number over a lower number, such as 110/72. The higher number (systolic) is the pressure inside your arteries when your heart pumps. The lower number (diastolic) is the pressure inside your arteries when your heart relaxes. Ideally you want your blood pressure below 120/80. Hypertension forces your heart to work harder to pump blood. Your arteries may become narrow or stiff. Having untreated or uncontrolled hypertension can cause heart attack, stroke, kidney disease, and other problems. RISK FACTORS Some risk factors for high blood pressure are controllable. Others are not.  Risk factors you cannot control include:   Race. You may be at higher risk if you are African American.  Age. Risk increases with age.  Gender. Men are at higher risk than women before age 45 years. After age 65, women are at higher risk than men. Risk factors you can control include:  Not getting enough exercise or physical activity.  Being overweight.  Getting too much fat, sugar, calories, or salt in your diet.  Drinking too much alcohol. SIGNS AND SYMPTOMS Hypertension does not usually cause signs or symptoms. Extremely high blood pressure (hypertensive crisis) may cause headache, anxiety, shortness of breath, and nosebleed. DIAGNOSIS To check if you have hypertension, your health care provider will measure your blood pressure while you are seated, with your arm held at the level of your heart. It should be measured at least twice using the same arm. Certain conditions can cause a difference in blood pressure between your right and left arms. A blood pressure reading that is higher than normal on one occasion does not mean that you need treatment. If  it is not clear whether you have high blood pressure, you may be asked to return on a different day to have your blood pressure checked again. Or, you may be asked to monitor your blood pressure at home for 1 or more weeks. TREATMENT Treating high blood pressure includes making lifestyle changes and possibly taking medicine. Living a healthy lifestyle can help lower high blood pressure. You may need to change some of your habits. Lifestyle changes may include:  Following the DASH diet. This diet is high in fruits, vegetables, and whole grains. It is low in salt, red meat, and added sugars.  Keep your sodium intake below 2,300 mg per day.  Getting at least 30-45 minutes of aerobic exercise at least 4 times per week.  Losing weight if necessary.  Not smoking.  Limiting alcoholic beverages.  Learning ways to reduce stress. Your health care provider may prescribe medicine if lifestyle changes are not enough to get your blood pressure under control, and if one of the following is true:  You are 18-59 years of age and your systolic blood pressure is above 140.  You are 60 years of age or older, and your systolic blood pressure is above 150.  Your diastolic blood pressure is above 90.  You have diabetes, and your systolic blood pressure is over 140 or your diastolic blood pressure is over 90.  You have kidney disease and your blood pressure is above 140/90.  You have heart disease and your blood pressure is above 140/90. Your personal target blood pressure may vary depending on your medical conditions, your age, and other factors. HOME CARE INSTRUCTIONS    Have your blood pressure rechecked as directed by your health care provider.   Take medicines only as directed by your health care provider. Follow the directions carefully. Blood pressure medicines must be taken as prescribed. The medicine does not work as well when you skip doses. Skipping doses also puts you at risk for  problems.  Do not smoke.   Monitor your blood pressure at home as directed by your health care provider. SEEK MEDICAL CARE IF:   You think you are having a reaction to medicines taken.  You have recurrent headaches or feel dizzy.  You have swelling in your ankles.  You have trouble with your vision. SEEK IMMEDIATE MEDICAL CARE IF:  You develop a severe headache or confusion.  You have unusual weakness, numbness, or feel faint.  You have severe chest or abdominal pain.  You vomit repeatedly.  You have trouble breathing. MAKE SURE YOU:   Understand these instructions.  Will watch your condition.  Will get help right away if you are not doing well or get worse.   This information is not intended to replace advice given to you by your health care provider. Make sure you discuss any questions you have with your health care provider.   Document Released: 07/09/2005 Document Revised: 11/23/2014 Document Reviewed: 05/01/2013 Elsevier Interactive Patient Education 2016 [employer].   Emergency Department Resource Guide 1) Find a Doctor and Pay Out of Pocket Although you won't have to find out who is covered by your insurance plan, it is a good idea to ask around and get recommendations. You will then need to call the office and see if the doctor you have chosen will accept you as a new patient and what types of options they offer for patients who are self-pay. Some doctors offer discounts or will set up payment plans for their patients who do not have insurance, but you will need to ask so you aren't surprised when you get to your appointment.  2) Contact Your Local Health Department Not all health departments have doctors that can see patients for sick visits, but many do, so it is worth a call to see if yours does. If you don't know where your local health department is, you can check in your phone book. The CDC also has a tool to help you locate your state's health  department, and many state websites also have listings of all of their local health departments.  3) Find a [facility] If your illness is not likely to be very severe or complicated, you may want to try a walk in clinic. These are popping up all over the country in pharmacies, drugstores, and shopping centers. They're usually staffed by nurse practitioners or physician assistants that have been trained to treat common illnesses and complaints. They're usually fairly quick and inexpensive. However, if you have serious medical issues or chronic medical problems, these are probably not your best option.  No Primary Care Doctor: - Call Health Connect at  [phone] - they can help you locate a primary care doctor that  accepts your insurance, provides certain services, etc. - Physician Referral Service- [phone]  Chronic Pain Problems: Organization         Address  Phone   Notes  [facility]  [phone] Patients need to be referred by their primary care doctor.   Medication Assistance: Organization         Address  Phone   Notes  [facility] Medication  Assistance Program [address] [place], [place] [address] [phone] --Must be a resident of [facility] -- Must have NO insurance coverage whatsoever (no Medicaid/ Medicare, etc.) -- The pt. MUST have a primary care doctor that directs their care regularly and follows them in the community   [facility]ist  [phone]   [employer]  [phone]    Agencies that provide inexpensive medical care: Organization         Address  Phone   Notes  [place]  [phone]   [facility]    [phone]   [facility] [place], [place] [address] [phone]   [facility] [address], [place] [phone]   Planned Parenthood    ([phone]   [facility]    [phone]    [place] and [place] [address], [place] Phone:  [phone], Fax:  [phone] Hours of Operation:  9 am - 6 pm, M-F.  Also accepts Medicaid/Medicare and self-pay.  [facility] for [place], [address], [place] Phone: [phone], Fax: [phone]. Hours of Operation:  8:30 am - 5:30 pm, M-F.  Also accepts Medicaid and self-pay.  [facility] [address] [place] Phone: [phone]   [place], [place], [place] [phone], Ext. [phone] Mondays & Thursdays: 7-9 AM.  First 15 patients are seen on a first come, first serve basis.    [place] Providers:  Organization         Address  Phone   Notes  [facility] [address], [place] [phone] Also accepts self-pay patients.  [facility] [address], [place]  [phone]   [place], [address], [place] [phone]   [facility] [address] [place] [phone]   Lucianne Lei [address], [place]   [phone] Only accepts [place] Access [place] patients after they have their name applied to their card.   Self-Pay (no insurance) in [facility]:  Organization         Address  Phone   Notes  Sickle Cell Patients, [facility] [place] [phone]   [facility] [place] [phone]   [facility] [place]  [place], [place], [place] [phone]   [facility]/Dr. Osei-Bonsu  [address], [place] or [address], [place] [phone] Phone number for both [place] and [place] locations is the same.  Urgent Medical and [facility] [address] [place] [phone]    [facility] [address], [place] or [address] [phone]   [facility] [address] [place] [phone], phone; [phone], fax Sees patients 1st and 3rd Saturday of every month.  Must not qualify for public or private insurance (i.e. Medicaid, Medicare,  Health Choice, Veterans' Benefits)  Household income should be no more than 200% of the poverty level The clinic cannot treat you if you are pregnant or think you are pregnant  Sexually transmitted diseases are not treated at  the clinic.    Dental Care: Organization         Address  Phone  Notes  [facility] Department of [facility] [phone] Accepts children up to age 36 who are enrolled in [place] or [place]; pregnant women with a Medicaid card; and children who have applied for Medicaid or [place] Health Choice, but were declined, whose parents can pay a reduced fee at time of service.  [facility] Department of [facility]  [address], [place] [phone] Accepts children up to age 76 who are enrolled in [place] or [place]; pregnant women with a Medicaid card; and children who have applied for Medicaid or [place] Health Choice, but were declined, whose parents can pay a reduced fee at time of service.  [place] Adult Dental Access PROGRAM  [place] [phone] Patients are seen by appointment only. Walk-ins are not accepted. [place] will see patients 65 years of age and older. Monday - Tuesday (8am-5pm) Most Wednesdays (8:30-5pm) $30 per visit, cash only  [facility] Adult Dental Access PROGRAM  [address], [facility] [phone] Patients are seen by appointment only. Walk-ins are not accepted. [place] will see patients 56 years of age and older. One Wednesday Evening (Monthly: Volunteer Based).   $30 per visit, cash only  [place]  [phone] for adults; Children under age 48, call Graduate Pediatric Dentistry at [phone]. Children aged 63-14, please call [phone] to request a pediatric application.  Dental services are provided in all areas of dental care including fillings, crowns and bridges, complete and partial dentures, implants, gum treatment, root canals, and extractions. Preventive care is also provided. Treatment is provided to both adults and children. Patients are selected via a lottery and there is often a waiting list.   [facility] [address] [place]  [phone] [email or web address]   [facility] [address] [place], [place] [phone], Ext. [phone] Second and Fourth Thursday of each month, opens at 6:30 AM; Clinic ends at 9 AM.  Patients are seen on a first-come first-served basis, and a limited number are seen during each clinic.   [facility]  [address]anker [place], [place] [phone]   Eligibility Requirements You must have lived in [place], [place], or [place] counties for at least the last three months.   You cannot be eligible for state or federal sponsored [employer], including [employer], [place], or [employer].   You generally cannot be eligible for healthcare insurance through your employer.    How to apply: Eligibility screenings are held every Tuesday and Wednesday afternoon from 1:00 pm until 4:00 pm. You do not need an appointment for the interview!  [facility] [address], [place], [place]   [place]  [place] Department  [place]  [phone]    Behavioral Health Resources in the Community: Intensive Outpatient Programs Organization         Address  Phone  Notes  [place]. [address], [place], [place] [phone]   [facility] Outpatient [address] [place], [place]   ADS: Alcohol & Drug Svcs [address], [place], [place]   [facility] [address],  [place], [place] or [phone]   Substance Abuse Resources Organization         Address  Phone  Notes  Alcohol and Drug Services  [phone]   [place]  [phone]   The [place]   Chinita Pester  ([phone]   Residential & Outpatient Substance Abuse Program  [phone]   Psychological Services Organization         Address  Phone  Notes  [facility]  [place]  [phone]   [place] [address] [place] or [phone]    Mobile Crisis Teams Organization         Address  Phone  Notes  [facility], Mobile Crisis Care Unit  [phone]   [facility]  [address] [place], [place]   [place] [address] [place] [phone]    Self-Help/Support Groups Organization         Address  Phone             Notes  [place]. of [place] - variety of support groups  [place] Call for more information  Narcotics Anonymous (NA), [facility] [address], [employer] [place]  2 meetings at this location   Special educational needs teacher         Address  Phone  Notes  ASAP Residential Treatment [place],    [place]  [phone]   [facility]  [address], [place] [phone], [place], [place]   [place], [place] [phone] Admissions: 8am-3pm M-F  Incentives Substance [place] [address]    [place], [place] [phone]   [facility] [address] [place], [place], [place]   [facility] [address],  [place], [place]   [facility] - Intensive Outpatient [address], [place], [place]   [facility] ([place].,  [place], [place] [phone] or [phone]   [facility]idential Treatment Services (RTS) [address] [place], [place] Accepts Medicaid  [facility] [place] [address]  [place] [phone] Substance Abuse/Addiction Treatment   [facility] Organization         Address  Phone  Notes  [facility]  [phone]   Domenic Schwab, PhD [address] [place]lis Porta [place], [place]   [phone] or [phone]   [place], [place] [phone]   [facility] [address], [place], [place] [phone] Insurance/Medicaid/sponsorship through [facility] and Families [address] [place]                                    [place], [place] [phone] [place] [address], [place] [phone]    Dr. Adele Schilder  [phone]   [facility] Dept. 1) [address] [place] 2) [place] 3)  [place] [address], [place] [phone]  [phone]   [place] [phone] or [phone] (After Hours)

## 2015-05-15 NOTE — ED Notes (Signed)
Patient states she has been intermittently dizzy x1 week.  Patient believes her BP is elevated and states she hasn't taken her BP medication x3 weeks.  Patient has no PCP to manage her meds. Patient endorses occasional nausea, but denies vomiting.  Patient also c/o headache x2 weeks.  Patient denies changes in vision.  Patient states she feels "off balance" and states she fell at work last Wednesday, injuring right knee.  Patient c/o pain above right knee, worse when walking or bending knee.  Patient endorses cough and nasal congestion as well.

## 2015-08-27 ENCOUNTER — Emergency Department (HOSPITAL_COMMUNITY)
Admission: EM | Admit: 2015-08-27 | Discharge: 2015-08-27 | Disposition: A | Discharge status: Home / Self Care | Payer: Medicaid Other | Attending: Emergency Medicine | Admitting: Emergency Medicine

## 2015-08-27 ENCOUNTER — Encounter (HOSPITAL_COMMUNITY): Payer: Self-pay | Admitting: Emergency Medicine

## 2015-08-27 DIAGNOSIS — F17210 Nicotine dependence, cigarettes, uncomplicated: Secondary | ICD-10-CM | POA: Insufficient documentation

## 2015-08-27 DIAGNOSIS — J0100 Acute maxillary sinusitis, unspecified: Secondary | ICD-10-CM

## 2015-08-27 DIAGNOSIS — Z79899 Other long term (current) drug therapy: Secondary | ICD-10-CM | POA: Insufficient documentation

## 2015-08-27 DIAGNOSIS — I10 Essential (primary) hypertension: Secondary | ICD-10-CM

## 2015-08-27 MED ORDER — LISINOPRIL 20 MG PO TABS: 20.0000 mg | ORAL_TABLET | Freq: Every day | ORAL | Status: DC

## 2015-08-27 MED ORDER — LORATADINE 10 MG PO TBDP: 10.0000 mg | ORAL_TABLET | Freq: Every day | ORAL | Status: DC

## 2015-08-27 MED ORDER — AMOXICILLIN 500 MG PO CAPS: 500.0000 mg | ORAL_CAPSULE | Freq: Three times a day (TID) | ORAL | Status: DC

## 2015-08-27 MED ORDER — FLUTICASONE PROPIONATE 50 MCG/ACT NA SUSP: NASAL | Status: DC

## 2015-08-27 MED ORDER — LISINOPRIL 20 MG PO TABS
20.0000 mg | ORAL_TABLET | Freq: Once | ORAL | Status: AC
  Administered 2015-08-27: 20 mg via ORAL
  Filled 2015-08-27: qty 1

## 2015-08-27 NOTE — ED Notes (Addendum)
Pt complaint of generalized body aches and productive cough for a week. Pt reports has not take BP medication in 10 days.

## 2015-08-27 NOTE — ED Provider Notes (Signed)
CSN: [id number]     Arrival date & time 08/27/15  1113 History   First MD Initiated Contact with Patient 08/27/15 1130     Chief Complaint  Patient presents with  . Cough  . Generalized Body Aches      HPI  Patient presents for evaluation of bodyaches with a cough as well as high blood pressure. His been off her blood pressure medicine for about 10 days had some difficulty with her insurance just got a straight now. States she should be on again within the next few days. She's had bodyaches and facial congestion and sinus pain for last 4-5 days. Morning cough. No fever. Generalized body aches. No headache. Just simple facial pain. No chest pain shortness of breath. No abdominal pain nausea vomiting diarrhea. No extremity pain or swelling.  Past Medical History  Diagnosis Date  . Hypertension    Past Surgical History  Procedure Laterality Date  . Appendectomy    . Cesarean section    . Tubal ligation     Family History  Problem Relation Age of Onset  . Heart failure Mother   . Diabetes Mother    Social History  Substance Use Topics  . Smoking status: Current Every Day Smoker -- 0.50 packs/day    Types: Cigarettes  . Smokeless tobacco: Never Used  . Alcohol Use: No   OB History    No data available     Review of Systems  Constitutional: Negative for fever, chills, diaphoresis, appetite change and fatigue.  HENT: Positive for congestion, postnasal drip, rhinorrhea and sinus pressure. Negative for mouth sores, sore throat and trouble swallowing.   Eyes: Negative for visual disturbance.  Respiratory: Negative for cough, chest tightness, shortness of breath and wheezing.   Cardiovascular: Negative for chest pain.  Gastrointestinal: Negative for nausea, vomiting, abdominal pain, diarrhea and abdominal distention.  Endocrine: Negative for polydipsia, polyphagia and polyuria.  Genitourinary: Negative for dysuria, frequency and hematuria.  Musculoskeletal: Negative for gait  problem.  Skin: Negative for color change, pallor and rash.  Neurological: Negative for dizziness, syncope, light-headedness and headaches.  Hematological: Does not bruise/bleed easily.  Psychiatric/Behavioral: Negative for behavioral problems and confusion.      Allergies  Codeine and Food  Home Medications   Prior to Admission medications   Medication Sig Start Date End Date Taking? Authorizing Provider  Aspirin-Salicylamide-Caffeine (BC HEADACHE POWDER PO) Take 1 Package by mouth every 4 (four) hours as needed (for pain).   Yes Historical Provider, MD  amoxicillin (AMOXIL) 500 MG capsule Take 1 capsule (500 mg total) by mouth 3 (three) times daily. 08/27/15   Tanna Furry, MD  amoxicillin-clavulanate (AUGMENTIN) 875-125 MG per tablet Take 1 tablet by mouth 2 (two) times daily. Patient not taking: Reported on 08/27/2015 02/05/15   Quintella Reichert, MD  cetirizine (ZYRTEC) 10 MG tablet Take 1 tablet (10 mg total) by mouth daily. Patient not taking: Reported on 02/05/2015 11/06/14   Glendell Docker, NP  fluticasone Asencion Islam) 50 MCG/ACT nasal spray 1 spray  Each naris bid 08/27/15   Tanna Furry, MD  lisinopril (PRINIVIL,ZESTRIL) 20 MG tablet Take 1 tablet (20 mg total) by mouth daily. 08/27/15   Tanna Furry, MD  lisinopril-hydrochlorothiazide (PRINZIDE,ZESTORETIC) 20-12.5 MG tablet Take 1 tablet by mouth daily. 05/15/15   Serita Grit, MD  loratadine (CLARITIN REDITABS) 10 MG dissolvable tablet Take 1 tablet (10 mg total) by mouth daily. 08/27/15   Tanna Furry, MD   BP 186/83 mmHg  Pulse 64  Temp(Src) 98.6  F (37 C) (Oral)  Resp 18  SpO2 100% Physical Exam  Constitutional: She is oriented to person, place, and time. She appears well-developed and well-nourished. No distress.  HENT:  Head: Normocephalic.  Nose:    Eyes: Conjunctivae are normal. Pupils are equal, round, and reactive to light. No scleral icterus.  Neck: Normal range of motion. Neck supple. No thyromegaly present.  Cardiovascular:  Normal rate and regular rhythm.  Exam reveals no gallop and no friction rub.   No murmur heard. Pulmonary/Chest: Effort normal and breath sounds normal. No respiratory distress. She has no wheezes. She has no rales.  Clear bilateral breath sounds.  Abdominal: Soft. Bowel sounds are normal. She exhibits no distension. There is no tenderness. There is no rebound.  Musculoskeletal: Normal range of motion.  Neurological: She is alert and oriented to person, place, and time.  Skin: Skin is warm and dry. No rash noted.  Psychiatric: She has a normal mood and affect. Her behavior is normal.    ED Course  Procedures (including critical care time) Labs Review Labs Reviewed - No data to display  Imaging Review No results found. I have personally reviewed and evaluated these images and lab results as part of my medical decision-making.   EKG Interpretation None      MDM   Final diagnoses:  Essential hypertension  Acute maxillary sinusitis, recurrence not specified    Patient presents for evaluation of a cough. Has high blood pressure has not been on her medicine for almost 2 weeks. Symptoms consistent with sinusitis. She is not hypoxemic. No clinical signs to suggest ammonia. Clinical congestive heart failure. No gallop, no edema, no JVD. Not hypoxemic and clear lungs. Plan is amoxicillin and Flonase Claritin. Refill her medications and asked to be compliant with her blood pressure medications.    Tanna Furry, MD 08/27/15 [phone]

## 2015-08-27 NOTE — Discharge Instructions (Signed)
Hypertension Hypertension, commonly called high blood pressure, is when the force of blood pumping through your arteries is too strong. Your arteries are the blood vessels that carry blood from your heart throughout your body. A blood pressure reading consists of a higher number over a lower number, such as 110/72. The higher number (systolic) is the pressure inside your arteries when your heart pumps. The lower number (diastolic) is the pressure inside your arteries when your heart relaxes. Ideally you want your blood pressure below 120/80. Hypertension forces your heart to work harder to pump blood. Your arteries may become narrow or stiff. Having untreated or uncontrolled hypertension can cause heart attack, stroke, kidney disease, and other problems. RISK FACTORS Some risk factors for high blood pressure are controllable. Others are not.  Risk factors you cannot control include:   Race. You may be at higher risk if you are African American.  Age. Risk increases with age.  Gender. Men are at higher risk than women before age 45 years. After age 65, women are at higher risk than men. Risk factors you can control include:  Not getting enough exercise or physical activity.  Being overweight.  Getting too much fat, sugar, calories, or salt in your diet.  Drinking too much alcohol. SIGNS AND SYMPTOMS Hypertension does not usually cause signs or symptoms. Extremely high blood pressure (hypertensive crisis) may cause headache, anxiety, shortness of breath, and nosebleed. DIAGNOSIS To check if you have hypertension, your health care provider will measure your blood pressure while you are seated, with your arm held at the level of your heart. It should be measured at least twice using the same arm. Certain conditions can cause a difference in blood pressure between your right and left arms. A blood pressure reading that is higher than normal on one occasion does not mean that you need treatment. If  it is not clear whether you have high blood pressure, you may be asked to return on a different day to have your blood pressure checked again. Or, you may be asked to monitor your blood pressure at home for 1 or more weeks. TREATMENT Treating high blood pressure includes making lifestyle changes and possibly taking medicine. Living a healthy lifestyle can help lower high blood pressure. You may need to change some of your habits. Lifestyle changes may include:  Following the DASH diet. This diet is high in fruits, vegetables, and whole grains. It is low in salt, red meat, and added sugars.  Keep your sodium intake below 2,300 mg per day.  Getting at least 30-45 minutes of aerobic exercise at least 4 times per week.  Losing weight if necessary.  Not smoking.  Limiting alcoholic beverages.  Learning ways to reduce stress. Your health care provider may prescribe medicine if lifestyle changes are not enough to get your blood pressure under control, and if one of the following is true:  You are 18-59 years of age and your systolic blood pressure is above 140.  You are 60 years of age or older, and your systolic blood pressure is above 150.  Your diastolic blood pressure is above 90.  You have diabetes, and your systolic blood pressure is over 140 or your diastolic blood pressure is over 90.  You have kidney disease and your blood pressure is above 140/90.  You have heart disease and your blood pressure is above 140/90. Your personal target blood pressure may vary depending on your medical conditions, your age, and other factors. HOME CARE INSTRUCTIONS    Have your blood pressure rechecked as directed by your health care provider.   Take medicines only as directed by your health care provider. Follow the directions carefully. Blood pressure medicines must be taken as prescribed. The medicine does not work as well when you skip doses. Skipping doses also puts you at risk for  problems.  Do not smoke.   Monitor your blood pressure at home as directed by your health care provider. SEEK MEDICAL CARE IF:   You think you are having a reaction to medicines taken.  You have recurrent headaches or feel dizzy.  You have swelling in your ankles.  You have trouble with your vision. SEEK IMMEDIATE MEDICAL CARE IF:  You develop a severe headache or confusion.  You have unusual weakness, numbness, or feel faint.  You have severe chest or abdominal pain.  You vomit repeatedly.  You have trouble breathing. MAKE SURE YOU:   Understand these instructions.  Will watch your condition.  Will get help right away if you are not doing well or get worse.   This information is not intended to replace advice given to you by your health care provider. Make sure you discuss any questions you have with your health care provider.   Document Released: 07/09/2005 Document Revised: 11/23/2014 Document Reviewed: 05/01/2013 Elsevier Interactive Patient Education 2016 [employer]  Sinusitis, Adult Sinusitis is redness, soreness, and inflammation of the paranasal sinuses. Paranasal sinuses are air pockets within the bones of your face. They are located beneath your eyes, in the middle of your forehead, and above your eyes. In healthy paranasal sinuses, mucus is able to drain out, and air is able to circulate through them by way of your nose. However, when your paranasal sinuses are inflamed, mucus and air can become trapped. This can allow bacteria and other germs to grow and cause infection. Sinusitis can develop quickly and last only a short time (acute) or continue over a long period (chronic). Sinusitis that lasts for more than 12 weeks is considered chronic. CAUSES Causes of sinusitis include:  Allergies.  Structural abnormalities, such as displacement of the cartilage that separates your nostrils (deviated septum), which can decrease the air flow through your nose and  sinuses and affect sinus drainage.  Functional abnormalities, such as when the small hairs (cilia) that line your sinuses and help remove mucus do not work properly or are not present. SIGNS AND SYMPTOMS Symptoms of acute and chronic sinusitis are the same. The primary symptoms are pain and pressure around the affected sinuses. Other symptoms include:  Upper toothache.  Earache.  Headache.  Bad breath.  Decreased sense of smell and taste.  A cough, which worsens when you are lying flat.  Fatigue.  Fever.  Thick drainage from your nose, which often is green and may contain pus (purulent).  Swelling and warmth over the affected sinuses. DIAGNOSIS Your health care provider will perform a physical exam. During your exam, your health care provider may perform any of the following to help determine if you have acute sinusitis or chronic sinusitis:  Look in your nose for signs of abnormal growths in your nostrils (nasal polyps).  Tap over the affected sinus to check for signs of infection.  View the inside of your sinuses using an imaging device that has a light attached (endoscope). If your health care provider suspects that you have chronic sinusitis, one or more of the following tests may be recommended:  Allergy tests.  Nasal culture. A sample of mucus  is taken from your nose, sent to a lab, and screened for bacteria.  Nasal cytology. A sample of mucus is taken from your nose and examined by your health care provider to determine if your sinusitis is related to an allergy. TREATMENT Most cases of acute sinusitis are related to a viral infection and will resolve on their own within 10 days. Sometimes, medicines are prescribed to help relieve symptoms of both acute and chronic sinusitis. These may include pain medicines, decongestants, nasal steroid sprays, or saline sprays. However, for sinusitis related to a bacterial infection, your health care provider will prescribe  antibiotic medicines. These are medicines that will help kill the bacteria causing the infection. Rarely, sinusitis is caused by a fungal infection. In these cases, your health care provider will prescribe antifungal medicine. For some cases of chronic sinusitis, surgery is needed. Generally, these are cases in which sinusitis recurs more than 3 times per year, despite other treatments. HOME CARE INSTRUCTIONS  Drink plenty of water. Water helps thin the mucus so your sinuses can drain more easily.  Use a humidifier.  Inhale steam 3-4 times a day (for example, sit in the bathroom with the shower running).  Apply a warm, moist washcloth to your face 3-4 times a day, or as directed by your health care provider.  Use saline nasal sprays to help moisten and clean your sinuses.  Take medicines only as directed by your health care provider.  If you were prescribed either an antibiotic or antifungal medicine, finish it all even if you start to feel better. SEEK IMMEDIATE MEDICAL CARE IF:  You have increasing pain or severe headaches.  You have nausea, vomiting, or drowsiness.  You have swelling around your face.  You have vision problems.  You have a stiff neck.  You have difficulty breathing.   This information is not intended to replace advice given to you by your health care provider. Make sure you discuss any questions you have with your health care provider.   Document Released: 07/09/2005 Document Revised: 07/30/2014 Document Reviewed: 07/24/2011 Elsevier Interactive Patient Education [employer].

## 2015-09-28 ENCOUNTER — Emergency Department (HOSPITAL_COMMUNITY)
Admission: EM | Admit: 2015-09-28 | Discharge: 2015-09-28 | Disposition: A | Discharge status: Home / Self Care | Payer: Medicaid Other | Attending: Emergency Medicine | Admitting: Emergency Medicine

## 2015-09-28 ENCOUNTER — Emergency Department (HOSPITAL_COMMUNITY): Payer: Medicaid Other

## 2015-09-28 ENCOUNTER — Encounter (HOSPITAL_COMMUNITY): Payer: Self-pay

## 2015-09-28 DIAGNOSIS — B9789 Other viral agents as the cause of diseases classified elsewhere: Secondary | ICD-10-CM

## 2015-09-28 DIAGNOSIS — Z792 Long term (current) use of antibiotics: Secondary | ICD-10-CM | POA: Insufficient documentation

## 2015-09-28 DIAGNOSIS — F17210 Nicotine dependence, cigarettes, uncomplicated: Secondary | ICD-10-CM | POA: Insufficient documentation

## 2015-09-28 DIAGNOSIS — Z79899 Other long term (current) drug therapy: Secondary | ICD-10-CM | POA: Insufficient documentation

## 2015-09-28 DIAGNOSIS — J069 Acute upper respiratory infection, unspecified: Secondary | ICD-10-CM

## 2015-09-28 DIAGNOSIS — I10 Essential (primary) hypertension: Secondary | ICD-10-CM | POA: Insufficient documentation

## 2015-09-28 DIAGNOSIS — J029 Acute pharyngitis, unspecified: Secondary | ICD-10-CM

## 2015-09-28 DIAGNOSIS — J0141 Acute recurrent pansinusitis: Secondary | ICD-10-CM

## 2015-09-28 IMAGING — CR DG CHEST 2V
2 series · 2 of 2 positions shown · non-contrast
Comparison: [DATE]

CLINICAL DATA: Cough and congestion

EXAM:
CHEST  2 VIEW

[w chest pa]
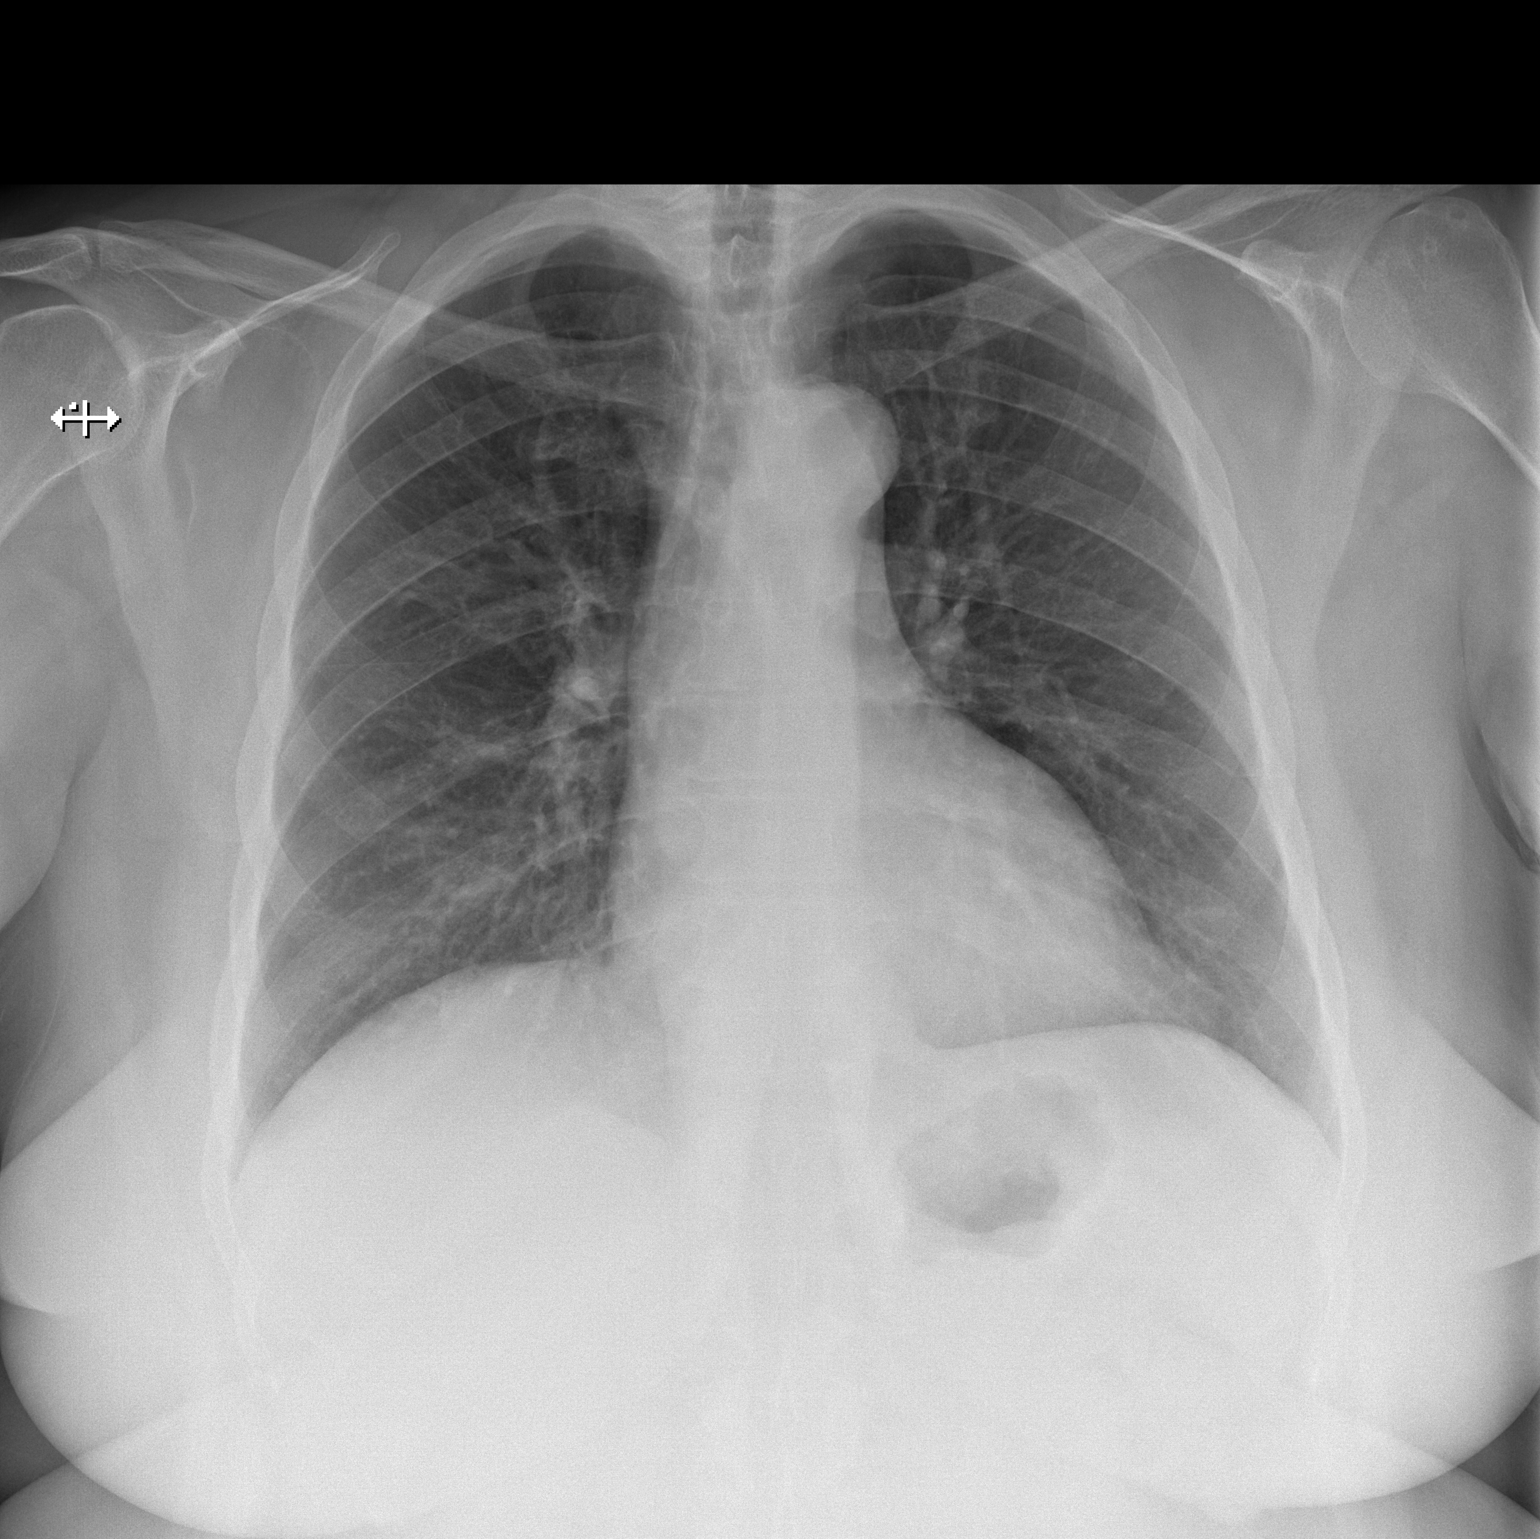

[w chest lat]
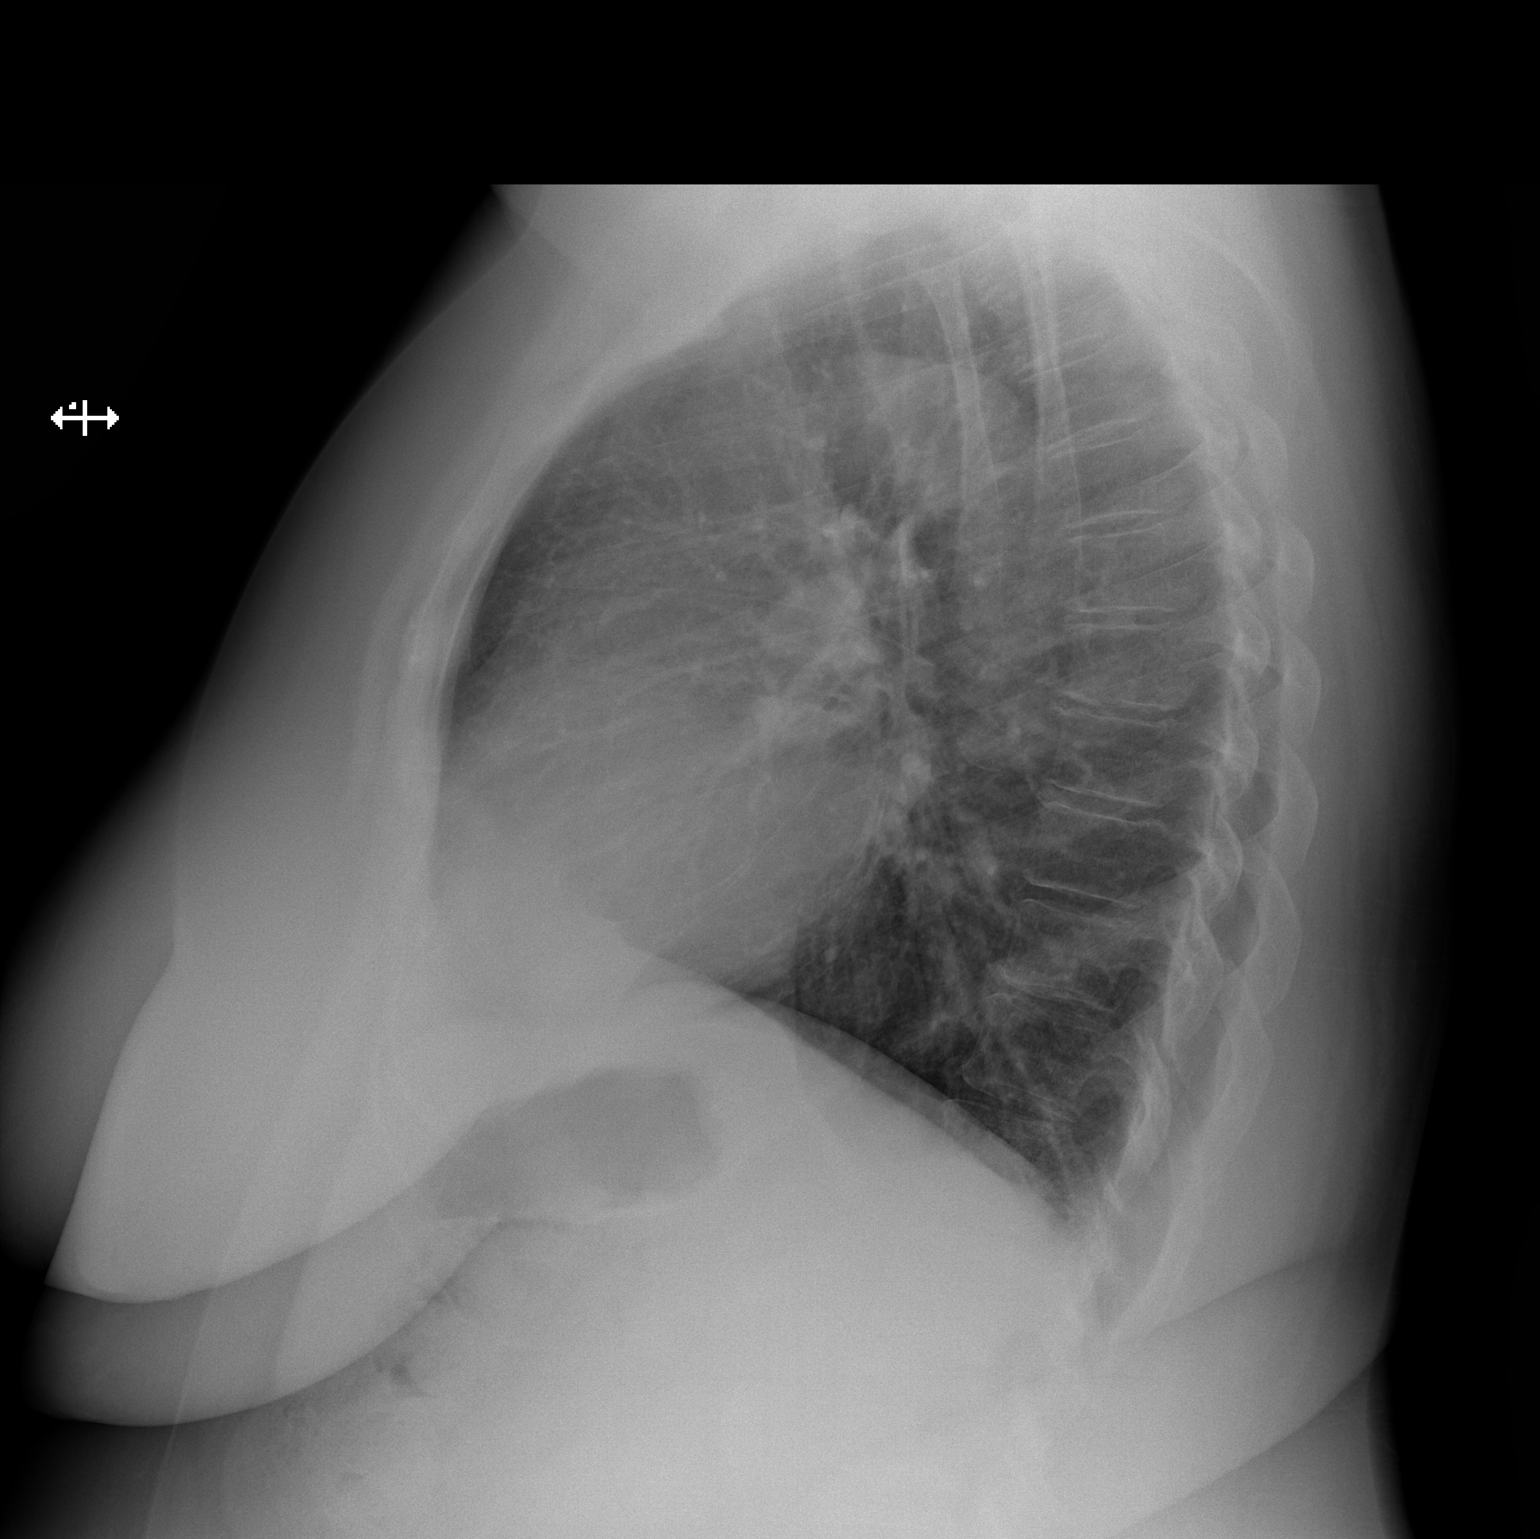

[2 of 2 positions shown; findings below may reference images not displayed]

FINDINGS: The heart size and mediastinal contours are within normal limits.
Both lungs are clear. The visualized skeletal structures are
unremarkable.
IMPRESSION: No active cardiopulmonary disease.

## 2015-09-28 MED ORDER — LISINOPRIL 20 MG PO TABS: 20.0000 mg | ORAL_TABLET | Freq: Every day | ORAL | Status: DC

## 2015-09-28 NOTE — ED Provider Notes (Signed)
CSN: [id number]     Arrival date & time 09/28/15  [id number] History   First MD Initiated Contact with Patient 09/28/15 0945     Chief Complaint  Patient presents with  . Sore Throat  . Cough     (Consider location/radiation/quality/duration/timing/severity/associated sxs/prior Treatment) HPI Comments: Cassandra Wilkinson is a 55 y.o. female with a PMHx of HTN, who presents to the ED with complaints of URI symptoms 3 days. She states she has had a cough with yellow sputum production, mild sore throat, sinus congestion and facial pain, and yellow rhinorrhea that gradually began 3 days ago. She has not tried any over-the-counter medications for her symptoms because she states that she cannot take anything due to her hypertension, no known aggravating factors. She was here with similar complaints one month ago but got better. She also states that she ran out of her blood pressure medications 4 days ago, states she takes lisinopril 20 mg daily, needs a refill. Does not have regular doctor. Positive sick contacts at work, states she works at a daycare. She denies any fevers, chills, ear pain or drainage, drooling, trismus, wheezing, vision changes, lightheadedness, headaches, chest pain, shortness breath, abdominal pain, nausea, vomiting, diarrhea, constipation, dysuria, hematuria, numbness, tingling, or focal weakness.  Patient is a 55 y.o. female presenting with pharyngitis and cough. The history is provided by the patient and medical records. No language interpreter was used.  Sore Throat Associated symptoms include coughing and a sore throat. Pertinent negatives include no abdominal pain, arthralgias, chest pain, chills, fever, headaches, myalgias, nausea, numbness, vomiting or weakness.  Cough Cough characteristics:  Productive Sputum characteristics:  Yellow Severity:  Mild Onset quality:  Gradual Duration:  3 days Timing:  Constant Progression:  Unchanged Chronicity:  New Smoker: yes   Context:  sick contacts and upper respiratory infection   Relieved by:  None tried Worsened by:  Nothing tried Ineffective treatments:  None tried Associated symptoms: rhinorrhea, sinus congestion and sore throat   Associated symptoms: no chest pain, no chills, no ear pain, no fever, no headaches, no myalgias, no shortness of breath and no wheezing   Risk factors: no recent travel     Past Medical History  Diagnosis Date  . Hypertension    Past Surgical History  Procedure Laterality Date  . Appendectomy    . Cesarean section    . Tubal ligation     Family History  Problem Relation Age of Onset  . Heart failure Mother   . Diabetes Mother    Social History  Substance Use Topics  . Smoking status: Current Every Day Smoker -- 0.50 packs/day    Types: Cigarettes  . Smokeless tobacco: Never Used  . Alcohol Use: No   OB History    No data available     Review of Systems  Constitutional: Negative for fever and chills.  HENT: Positive for rhinorrhea, sinus pressure and sore throat. Negative for drooling, ear discharge and ear pain.   Eyes: Negative for visual disturbance.  Respiratory: Positive for cough. Negative for shortness of breath and wheezing.   Cardiovascular: Negative for chest pain.  Gastrointestinal: Negative for nausea, vomiting, abdominal pain, diarrhea and constipation.  Genitourinary: Negative for dysuria and hematuria.  Musculoskeletal: Negative for myalgias and arthralgias.  Skin: Negative for color change.  Allergic/Immunologic: Negative for immunocompromised state.  Neurological: Negative for weakness, light-headedness, numbness and headaches.  Psychiatric/Behavioral: Negative for confusion.   10 Systems reviewed and are negative for acute change except as noted in  the HPI.    Allergies  Codeine and Food  Home Medications   Prior to Admission medications   Medication Sig Start Date End Date Taking? Authorizing Provider  amoxicillin (AMOXIL) 500 MG capsule  Take 1 capsule (500 mg total) by mouth 3 (three) times daily. 08/27/15   Tanna Furry, MD  amoxicillin-clavulanate (AUGMENTIN) 875-125 MG per tablet Take 1 tablet by mouth 2 (two) times daily. Patient not taking: Reported on 08/27/2015 02/05/15   Quintella Reichert, MD  Aspirin-Salicylamide-Caffeine [facility] HEADACHE POWDER PO) Take 1 Package by mouth every 4 (four) hours as needed (for pain).    Historical Provider, MD  cetirizine (ZYRTEC) 10 MG tablet Take 1 tablet (10 mg total) by mouth daily. Patient not taking: Reported on 02/05/2015 11/06/14   Glendell Docker, NP  fluticasone Asencion Islam) 50 MCG/ACT nasal spray 1 spray  Each naris bid 08/27/15   Tanna Furry, MD  lisinopril (PRINIVIL,ZESTRIL) 20 MG tablet Take 1 tablet (20 mg total) by mouth daily. 08/27/15   Tanna Furry, MD  lisinopril-hydrochlorothiazide (PRINZIDE,ZESTORETIC) 20-12.5 MG tablet Take 1 tablet by mouth daily. 05/15/15   Serita Grit, MD  loratadine (CLARITIN REDITABS) 10 MG dissolvable tablet Take 1 tablet (10 mg total) by mouth daily. 08/27/15   Tanna Furry, MD   BP 181/94 mmHg  Pulse 74  Temp(Src) 98.9 F (37.2 C) (Oral)  Resp 18  SpO2 100% Physical Exam  Constitutional: She is oriented to person, place, and time. Vital signs are normal. She appears well-developed and well-nourished.  Non-toxic appearance. No distress.  Afebrile, nontoxic, NAD. HTN noted which is similar to prior exams  HENT:  Head: Normocephalic and atraumatic.  Nose: Mucosal edema present. No rhinorrhea. Right sinus exhibits maxillary sinus tenderness and frontal sinus tenderness. Left sinus exhibits maxillary sinus tenderness and frontal sinus tenderness.  Mouth/Throat: Uvula is midline, oropharynx is clear and moist and mucous membranes are normal. No trismus in the jaw. No uvula swelling.  Nose with mild mucosal edema without rhinorrhea, diffuse sinus tenderness. Oropharynx clear and moist, without uvular swelling or deviation, no trismus or drooling, no tonsillar swelling or  erythema, no exudates.    Eyes: Conjunctivae and EOM are normal. Right eye exhibits no discharge. Left eye exhibits no discharge.  Neck: Normal range of motion. Neck supple.  Cardiovascular: Normal rate, regular rhythm, normal heart sounds and intact distal pulses.  Exam reveals no gallop and no friction rub.   No murmur heard. Pulmonary/Chest: Effort normal and breath sounds normal. No respiratory distress. She has no decreased breath sounds. She has no wheezes. She has no rhonchi. She has no rales.  CTAB in all lung fields, no w/r/r, no hypoxia or increased WOB, speaking in full sentences, SpO2 100% on RA   Abdominal: Soft. Normal appearance and bowel sounds are normal. She exhibits no distension. There is no tenderness. There is no rigidity, no rebound, no guarding, no CVA tenderness, no tenderness at McBurney's point and negative Murphy's sign.  Musculoskeletal: Normal range of motion.  Lymphadenopathy:       Head (right side): No submandibular and no tonsillar adenopathy present.       Head (left side): No submandibular and no tonsillar adenopathy present.    She has cervical adenopathy.  Shotty cervical LAD bilaterally which is nonTTP  Neurological: She is alert and oriented to person, place, and time. She has normal strength. No sensory deficit.  Skin: Skin is warm, dry and intact. No rash noted.  Psychiatric: She has a normal mood and affect.  Nursing note and vitals reviewed.   ED Course  Procedures (including critical care time) Labs Review Labs Reviewed - No data to display  Imaging Review Dg Chest 2 View  09/28/2015  CLINICAL DATA:  Cough and congestion EXAM: CHEST  2 VIEW COMPARISON:  07/13/2012 FINDINGS: The heart size and mediastinal contours are within normal limits. Both lungs are clear. The visualized skeletal structures are unremarkable. IMPRESSION: No active cardiopulmonary disease. Electronically Signed   By: Kerby Moors M.D.   On: 09/28/2015 09:05   I have  personally reviewed and evaluated these images and lab results as part of my medical decision-making.   EKG Interpretation None      MDM   Final diagnoses:  Viral URI with cough  Acute recurrent pansinusitis  Essential hypertension  Sore throat    55 y.o. female here with URI symptoms and cough x3 days. Also needs refill of BP meds. No PCP at this time, continuously gets refills of BP meds from the ER. Was seen last month with similar sxs, got abx at that time. Today she has a clear lung exam, mild nasal congestion and sinus tenderness, but overall benign exam. Doubt need for labs. CXR obtained prior to my eval is neg. Doubt need for abx at this time, likely viral etiology. No red flag s/sx for HTN, doubt need for further work up for this. Will refill BP meds and discussed f/up with [place] in 1wk to establish care and for ongoing management of symptoms. Discussed OTC meds for symptoms (coricidin). I explained the diagnosis and have given explicit precautions to return to the ER including for any other new or worsening symptoms. The patient understands and accepts the medical plan as it's been dictated and I have answered their questions. Discharge instructions concerning home care and prescriptions have been given. The patient is STABLE and is discharged to home in good condition.    BP 181/94 mmHg  Pulse 74  Temp(Src) 98.9 F (37.2 C) (Oral)  Resp 18  SpO2 100%  Meds ordered this encounter  Medications  . lisinopril (PRINIVIL,ZESTRIL) 20 MG tablet    Sig: Take 1 tablet (20 mg total) by mouth daily.    Dispense:  30 tablet    Refill:  1    Order Specific Question:  Supervising Provider    Answer:  Noemi Chapel [[id number]]     Shila Kruczek Camprubi-Soms, PA-C 09/28/15 [place], MD 09/29/15 [phone]

## 2015-09-28 NOTE — Discharge Instructions (Signed)
Continue to stay well-hydrated. Gargle warm salt water and spit it out. Use chloraseptic spray as needed for sore throat. Continue to alternate between Tylenol and Ibuprofen for pain or fever. Use Mucinex for cough suppression/expectoration of mucus. Use netipot and flonase to help with nasal congestion. May consider over-the-counter Benadryl or other antihistamine to decrease secretions and for watery itchy eyes. May use over-the-counter Coricidin HBP for symptoms. Followup with [facility] in 5-7 days for recheck of ongoing symptoms and to establish care. Return to emergency department for emergent changing or worsening of symptoms. Take your blood pressure medication as directed. Avoid salty foods. Follow up with [facility] for ongoing management of your blood pressure and refills of medications.   Cough, Adult A cough helps to clear your throat and lungs. A cough may last only 2-3 weeks (acute), or it may last longer than 8 weeks (chronic). Many different things can cause a cough. A cough may be a sign of an illness or another medical condition. HOME CARE  Pay attention to any changes in your cough.  Take medicines only as told by your doctor.  If you were prescribed an antibiotic medicine, take it as told by your doctor. Do not stop taking it even if you start to feel better.  Talk with your doctor before you try using a cough medicine.  Drink enough fluid to keep your pee (urine) clear or pale yellow.  If the air is dry, use a cold steam vaporizer or humidifier in your home.  Stay away from things that make you cough at work or at home.  If your cough is worse at night, try using extra pillows to raise your head up higher while you sleep.  Do not smoke, and try not to be around smoke. If you need help quitting, ask your doctor.  Do not have caffeine.  Do not drink alcohol.  Rest as needed. GET HELP IF:  You have new problems (symptoms).  You cough up  yellow fluid (pus).  Your cough does not get better after 2-3 weeks, or your cough gets worse.  Medicine does not help your cough and you are not sleeping well.  You have pain that gets worse or pain that is not helped with medicine.  You have a fever.  You are losing weight and you do not know why.  You have night sweats. GET HELP RIGHT AWAY IF:  You cough up blood.  You have trouble breathing.  Your heartbeat is very fast.   This information is not intended to replace advice given to you by your health care provider. Make sure you discuss any questions you have with your health care provider.   Document Released: 03/22/2011 Document Revised: 03/30/2015 Document Reviewed: 09/15/2014 Elsevier Interactive Patient Education 2016 Elsevier Inc.  Viral Infections A virus is a type of germ. Viruses can cause:  Minor sore throats.  Aches and pains.  Headaches.  Runny nose.  Rashes.  Watery eyes.  Tiredness.  Coughs.  Loss of appetite.  Feeling sick to your stomach (nausea).  Throwing up (vomiting).  Watery poop (diarrhea). HOME CARE   Only take medicines as told by your doctor.  Drink enough water and fluids to keep your pee (urine) clear or pale yellow. Sports drinks are a good choice.  Get plenty of rest and eat healthy. Soups and broths with crackers or rice are fine. GET HELP RIGHT AWAY IF:   You have a very bad headache.  You have shortness of breath.  You have chest pain or neck pain.  You have an unusual rash.  You cannot stop throwing up.  You have watery poop that does not stop.  You cannot keep fluids down.  You or your child has a temperature by mouth above 102 F (38.9 C), not controlled by medicine.  Your baby is older than 3 months with a rectal temperature of 102 F (38.9 C) or higher.  Your baby is 13 months old or younger with a rectal temperature of 100.4 F (38 C) or higher. MAKE SURE YOU:   Understand these  instructions.  Will watch this condition.  Will get help right away if you are not doing well or get worse.   This information is not intended to replace advice given to you by your health care provider. Make sure you discuss any questions you have with your health care provider.   Document Released: 06/21/2008 Document Revised: 10/01/2011 Document Reviewed: 12/15/2014 Elsevier Interactive Patient Education 2016 [employer].  Sinusitis, Adult Sinusitis is redness, soreness, and puffiness (inflammation) of the air pockets in the bones of your face (sinuses). The redness, soreness, and puffiness can cause air and mucus to get trapped in your sinuses. This can allow germs to grow and cause an infection.  HOME CARE   Drink enough fluids to keep your pee (urine) clear or pale yellow.  Use a humidifier in your home.  Run a hot shower to create steam in the bathroom. Sit in the bathroom with the door closed. Breathe in the steam 3-4 times a day.  Put a warm, moist washcloth on your face 3-4 times a day, or as told by your doctor.  Use salt water sprays (saline sprays) to wet the thick fluid in your nose. This can help the sinuses drain.  Only take medicine as told by your doctor. GET HELP RIGHT AWAY IF:   Your pain gets worse.  You have very bad headaches.  You are sick to your stomach (nauseous).  You throw up (vomit).  You are very sleepy (drowsy) all the time.  Your face is puffy (swollen).  Your vision changes.  You have a stiff neck.  You have trouble breathing. MAKE SURE YOU:   Understand these instructions.  Will watch your condition.  Will get help right away if you are not doing well or get worse.   This information is not intended to replace advice given to you by your health care provider. Make sure you discuss any questions you have with your health care provider.   Document Released: 12/26/2007 Document Revised: 07/30/2014 Document Reviewed:  02/12/2012 Elsevier Interactive Patient Education 2016 Elsevier Inc.  Sinus Rinse WHAT IS A SINUS RINSE? A sinus rinse is a home treatment. It rinses your sinuses with a mixture of salt and water (saline solution). Sinuses are air-filled spaces in your skull behind the bones of your face and forehead. They open into your nasal cavity. To do a sinus rinse, you will need:  Saline solution.  Neti pot or spray bottle. This releases the saline solution into your nose and through your sinuses. You can buy neti pots and spray bottles at:  Your local pharmacy.  A health food store.  Online. WHEN WOULD I DO A SINUS RINSE?  A sinus rinse can help to clear your nasal cavity. It can clear:   Mucus.  Dirt.  Dust.  Pollen. You may do a sinus rinse when you have:  A  cold.  A virus.  Allergies.  A sinus infection.  A stuffy nose. If you are considering a sinus rinse:  Ask your child's doctor before doing a sinus rinse on your child.  Do not do a sinus rinse if you have had:  Ear or nasal surgery.  An ear infection.  Blocked ears. HOW DO I DO A SINUS RINSE?   Wash your hands.  Disinfect your device using the directions that came with the device.  Dry your device.  Use the solution that comes with your device or one that is sold separately in stores. Follow the mixing directions on the package.  Fill your device with the amount of saline solution as stated in the device instructions.  Stand over a sink and tilt your head sideways over the sink.  Place the spout of the device in your upper nostril (the one closer to the ceiling).  Gently pour or squeeze the saline solution into the nasal cavity. The liquid should drain to the lower nostril if you are not too congested.  Gently blow your nose. Blowing too hard may cause ear pain.  Repeat in the other nostril.  Clean and rinse your device with clean water.  Air-dry your device. ARE THERE RISKS OF A SINUS RINSE?   Sinus rinse is normally very safe and helpful. However, there are a few risks, which include:   A burning feeling in the sinuses. This may happen if you do not make the saline solution as instructed. Make sure to follow all directions when making the saline solution.  Infection from unclean water. This is rare, but possible.  Nasal irritation.   This information is not intended to replace advice given to you by your health care provider. Make sure you discuss any questions you have with your health care provider.   Document Released: 02/03/2014 Document Reviewed: 02/03/2014 Elsevier Interactive Patient Education 2016 [employer].  Hypertension Hypertension is another name for high blood pressure. High blood pressure forces your heart to work harder to pump blood. A blood pressure reading has two numbers, which includes a higher number over a lower number (example: 110/72). HOME CARE   Have your blood pressure rechecked by your doctor.  Only take medicine as told by your doctor. Follow the directions carefully. The medicine does not work as well if you skip doses. Skipping doses also puts you at risk for problems.  Do not smoke.  Monitor your blood pressure at home as told by your doctor. GET HELP IF:  You think you are having a reaction to the medicine you are taking.  You have repeat headaches or feel dizzy.  You have puffiness (swelling) in your ankles.  You have trouble with your vision. GET HELP RIGHT AWAY IF:   You get a very bad headache and are confused.  You feel weak, numb, or faint.  You get chest or belly (abdominal) pain.  You throw up (vomit).  You cannot breathe very well. MAKE SURE YOU:   Understand these instructions.  Will watch your condition.  Will get help right away if you are not doing well or get worse.   This information is not intended to replace advice given to you by your health care provider. Make sure you discuss any questions you  have with your health care provider.   Document Released: 12/26/2007 Document Revised: 07/14/2013 Document Reviewed: 05/01/2013 Elsevier Interactive Patient Education 2016 [place] DASH stands for "Dietary Approaches to Stop  Hypertension." The DASH eating plan is a healthy eating plan that has been shown to reduce high blood pressure (hypertension). Additional health benefits may include reducing the risk of type 2 diabetes mellitus, heart disease, and stroke. The DASH eating plan may also help with weight loss. WHAT DO I NEED TO KNOW ABOUT THE DASH EATING PLAN? For the DASH eating plan, you will follow these general guidelines:  Choose foods with a percent daily value for sodium of less than 5% (as listed on the food label).  Use salt-free seasonings or herbs instead of table salt or sea salt.  Check with your health care provider or pharmacist before using salt substitutes.  Eat lower-sodium products, often labeled as "lower sodium" or "no salt added."  Eat fresh foods.  Eat more vegetables, fruits, and low-fat dairy products.  Choose whole grains. Look for the word "whole" as the first word in the ingredient list.  Choose fish and skinless chicken or [place] more often than red meat. Limit fish, poultry, and meat to 6 oz (170 g) each day.  Limit sweets, desserts, sugars, and sugary drinks.  Choose heart-healthy fats.  Limit cheese to 1 oz (28 g) per day.  Eat more home-cooked food and less restaurant, buffet, and fast food.  Limit fried foods.  Cook foods using methods other than frying.  Limit canned vegetables. If you do use them, rinse them well to decrease the sodium.  When eating at a restaurant, ask that your food be prepared with less salt, or no salt if possible. WHAT FOODS CAN I EAT? Seek help from a dietitian for individual calorie needs. Grains Whole grain or whole wheat bread. Brown rice. Whole grain or whole wheat pasta. Quinoa,  bulgur, and whole grain cereals. Low-sodium cereals. Corn or whole wheat flour tortillas. Whole grain cornbread. Whole grain crackers. Low-sodium crackers. Vegetables Fresh or frozen vegetables (raw, steamed, roasted, or grilled). Low-sodium or reduced-sodium tomato and vegetable juices. Low-sodium or reduced-sodium tomato sauce and paste. Low-sodium or reduced-sodium canned vegetables.  Fruits All fresh, canned (in natural juice), or frozen fruits. Meat and Other Protein Products Ground beef (85% or leaner), grass-fed beef, or beef trimmed of fat. Skinless chicken or [place]. Ground chicken or [place]. Pork trimmed of fat. All fish and seafood. Eggs. Dried beans, peas, or lentils. Unsalted nuts and seeds. Unsalted canned beans. Dairy Low-fat dairy products, such as skim or 1% milk, 2% or reduced-fat cheeses, low-fat ricotta or cottage cheese, or plain low-fat yogurt. Low-sodium or reduced-sodium cheeses. Fats and Oils Tub margarines without trans fats. Light or reduced-fat mayonnaise and salad dressings (reduced sodium). Avocado. Safflower, olive, or canola oils. Natural peanut or almond butter. Other Unsalted popcorn and pretzels. The items listed above may not be a complete list of recommended foods or beverages. Contact your dietitian for more options. WHAT FOODS ARE NOT RECOMMENDED? Grains White bread. White pasta. White rice. Refined cornbread. Bagels and croissants. Crackers that contain trans fat. Vegetables Creamed or fried vegetables. Vegetables in a cheese sauce. Regular canned vegetables. Regular canned tomato sauce and paste. Regular tomato and vegetable juices. Fruits Dried fruits. Canned fruit in light or heavy syrup. Fruit juice. Meat and Other Protein Products Fatty cuts of meat. Ribs, chicken wings, bacon, sausage, bologna, salami, chitterlings, fatback, hot dogs, bratwurst, and packaged luncheon meats. Salted nuts and seeds. Canned beans with salt. Dairy Whole or 2% milk,  cream, half-and-half, and cream cheese. Whole-fat or sweetened yogurt. Full-fat cheeses or blue cheese. Nondairy creamers and whipped toppings.  Processed cheese, cheese spreads, or cheese curds. Condiments Onion and garlic salt, seasoned salt, table salt, and sea salt. Canned and packaged gravies. Worcestershire sauce. Tartar sauce. Barbecue sauce. Teriyaki sauce. Soy sauce, including reduced sodium. Steak sauce. Fish sauce. Oyster sauce. Cocktail sauce. Horseradish. Ketchup and mustard. Meat flavorings and tenderizers. Bouillon cubes. Hot sauce. Tabasco sauce. Marinades. Taco seasonings. Relishes. Fats and Oils Butter, stick margarine, lard, shortening, ghee, and bacon fat. Coconut, palm kernel, or palm oils. Regular salad dressings. Other Pickles and olives. Salted popcorn and pretzels. The items listed above may not be a complete list of foods and beverages to avoid. Contact your dietitian for more information. WHERE CAN I FIND MORE INFORMATION? National Heart, Lung, and Blood Institute: travelstabloid.com   This information is not intended to replace advice given to you by your health care provider. Make sure you discuss any questions you have with your health care provider.   Document Released: 06/28/2011 Document Revised: 07/30/2014 Document Reviewed: 05/13/2013 Elsevier Interactive Patient Education [employer].

## 2015-09-28 NOTE — ED Notes (Signed)
Pt with sore throat and productive cough x 3 days.  No fever.  Pt cannot take over the counter meds.  States d/t her htn.  Pt is also out of her bp meds and has not taken in 4 days.

## 2015-12-17 ENCOUNTER — Encounter (HOSPITAL_COMMUNITY): Payer: Self-pay | Admitting: Emergency Medicine

## 2015-12-17 ENCOUNTER — Emergency Department (HOSPITAL_COMMUNITY)
Admission: EM | Admit: 2015-12-17 | Discharge: 2015-12-17 | Disposition: A | Discharge status: Home / Self Care | Payer: Medicaid Other | Attending: Emergency Medicine | Admitting: Emergency Medicine

## 2015-12-17 DIAGNOSIS — Z79899 Other long term (current) drug therapy: Secondary | ICD-10-CM | POA: Insufficient documentation

## 2015-12-17 DIAGNOSIS — Z760 Encounter for issue of repeat prescription: Secondary | ICD-10-CM | POA: Insufficient documentation

## 2015-12-17 DIAGNOSIS — F17210 Nicotine dependence, cigarettes, uncomplicated: Secondary | ICD-10-CM | POA: Insufficient documentation

## 2015-12-17 DIAGNOSIS — T7840XA Allergy, unspecified, initial encounter: Secondary | ICD-10-CM | POA: Insufficient documentation

## 2015-12-17 DIAGNOSIS — B3731 Acute candidiasis of vulva and vagina: Secondary | ICD-10-CM

## 2015-12-17 DIAGNOSIS — I10 Essential (primary) hypertension: Secondary | ICD-10-CM | POA: Insufficient documentation

## 2015-12-17 DIAGNOSIS — B373 Candidiasis of vulva and vagina: Secondary | ICD-10-CM | POA: Insufficient documentation

## 2015-12-17 DIAGNOSIS — Z7982 Long term (current) use of aspirin: Secondary | ICD-10-CM | POA: Insufficient documentation

## 2015-12-17 DIAGNOSIS — Z9109 Other allergy status, other than to drugs and biological substances: Secondary | ICD-10-CM

## 2015-12-17 LAB — URINALYSIS, ROUTINE W REFLEX MICROSCOPIC
Bilirubin Urine: NEGATIVE
Glucose, UA: NEGATIVE mg/dL
Hgb urine dipstick: NEGATIVE
Ketones, ur: NEGATIVE mg/dL
LEUKOCYTES UA: NEGATIVE
NITRITE: NEGATIVE
PROTEIN: NEGATIVE mg/dL
Specific Gravity, Urine: 1.038 — ABNORMAL HIGH (ref 1.005–1.030)
pH: 5.5 (ref 5.0–8.0)

## 2015-12-17 MED ORDER — CETIRIZINE HCL 10 MG PO TABS: 10.0000 mg | ORAL_TABLET | Freq: Every day | ORAL | Status: DC

## 2015-12-17 MED ORDER — FLUCONAZOLE 200 MG PO TABS
200.0000 mg | ORAL_TABLET | Freq: Once | ORAL | Status: AC
  Administered 2015-12-17: 200 mg via ORAL
  Filled 2015-12-17: qty 1

## 2015-12-17 MED ORDER — LISINOPRIL-HYDROCHLOROTHIAZIDE 20-12.5 MG PO TABS: 1.0000 | ORAL_TABLET | Freq: Every day | ORAL | Status: DC

## 2015-12-17 MED ORDER — FLUCONAZOLE 200 MG PO TABS: 200.0000 mg | ORAL_TABLET | Freq: Every day | ORAL | Status: AC

## 2015-12-17 NOTE — ED Notes (Signed)
Per pt, states she has been out of BP meds for over a week-also having urinary frequency and vaginal discharge

## 2015-12-17 NOTE — Discharge Instructions (Signed)
Allergies °An allergy is when your body reacts to a substance in a way that is not normal. An allergic reaction can happen after you: °· Eat something. °· Breathe in something. °· Touch something. °WHAT KINDS OF ALLERGIES ARE THERE? °You can be allergic to: °· Things that are only around during certain seasons, like molds and pollens. °· Foods. °· Drugs. °· Insects. °· Animal dander. °WHAT ARE SYMPTOMS OF ALLERGIES? °· Puffiness (swelling). This may happen on the lips, face, tongue, mouth, or throat. °· Sneezing. °· Coughing. °· Breathing loudly (wheezing). °· Stuffy nose. °· Tingling in the mouth. °· A rash. °· Itching. °· Itchy, red, puffy areas of skin (hives). °· Watery eyes. °· Throwing up (vomiting). °· Watery poop (diarrhea). °· Dizziness. °· Feeling faint or fainting. °· Trouble breathing or swallowing. °· A tight feeling in the chest. °· A fast heartbeat. °HOW ARE ALLERGIES DIAGNOSED? °Allergies can be diagnosed with: °· A medical and family history. °· Skin tests. °· Blood tests. °· A food diary. A food diary is a record of all the foods, drinks, and symptoms you have each day. °· The results of an elimination diet. This diet involves making sure not to eat certain foods and then seeing what happens when you start eating them again. °HOW ARE ALLERGIES TREATED? °There is no cure for allergies, but allergic reactions can be treated with medicine. Severe reactions usually need to be treated at a hospital.  °HOW CAN REACTIONS BE PREVENTED? °The best way to prevent an allergic reaction is to avoid the thing you are allergic to. Allergy shots and medicines can also help prevent reactions in some cases. °  °This information is not intended to replace advice given to you by your health care provider. Make sure you discuss any questions you have with your health care provider. °  °Document Released: 11/03/2012 Document Revised: 07/30/2014 Document Reviewed: 04/20/2014 °Elsevier Interactive Patient Education ©2016  Elsevier Inc. ° °

## 2015-12-17 NOTE — ED Provider Notes (Addendum)
CSN: [id number]     Arrival date & time 12/17/15  1408 History   First MD Initiated Contact with Patient 12/17/15 1420     Chief Complaint  Patient presents with  . Hypertension     (Consider location/radiation/quality/duration/timing/severity/associated sxs/prior Treatment) Patient is a 55 y.o. female presenting with hypertension. The history is provided by the patient.  Hypertension This is a chronic problem. Episode onset: Ran out of her medication 8 days ago. The problem occurs constantly. The problem has been gradually worsening. Associated symptoms include headaches. Associated symptoms comments: Intermittent nasal congestion which is worse over the last week. She has been using Afrin daily and states she gets occasional nosebleeds. No fever, chest pain or shortness of breath. Nothing aggravates the symptoms. Nothing relieves the symptoms. She has tried nothing for the symptoms. The treatment provided no relief.    Past Medical History  Diagnosis Date  . Hypertension    Past Surgical History  Procedure Laterality Date  . Appendectomy    . Cesarean section    . Tubal ligation     Family History  Problem Relation Age of Onset  . Heart failure Mother   . Diabetes Mother    Social History  Substance Use Topics  . Smoking status: Current Every Day Smoker -- 0.50 packs/day    Types: Cigarettes  . Smokeless tobacco: Never Used  . Alcohol Use: No   OB History    No data available     Review of Systems  Genitourinary: Positive for dysuria and vaginal discharge.       Over the last 1 week patient has noticed a smell and some mild vaginal discharge with itching and burning with urination. She is not currently sexually active  Neurological: Positive for headaches.  All other systems reviewed and are negative.     Allergies  Codeine and Food  Home Medications   Prior to Admission medications   Medication Sig Start Date End Date Taking? Authorizing Provider   amoxicillin (AMOXIL) 500 MG capsule Take 1 capsule (500 mg total) by mouth 3 (three) times daily. 08/27/15   Tanna Furry, MD  amoxicillin-clavulanate (AUGMENTIN) 875-125 MG per tablet Take 1 tablet by mouth 2 (two) times daily. Patient not taking: Reported on 08/27/2015 02/05/15   Quintella Reichert, MD  Aspirin-Salicylamide-Caffeine [facility] HEADACHE POWDER PO) Take 1 Package by mouth every 4 (four) hours as needed (for pain).    Historical Provider, MD  cetirizine (ZYRTEC) 10 MG tablet Take 1 tablet (10 mg total) by mouth daily. Patient not taking: Reported on 02/05/2015 11/06/14   Glendell Docker, NP  fluticasone Asencion Islam) 50 MCG/ACT nasal spray 1 spray  Each naris bid 08/27/15   Tanna Furry, MD  lisinopril (PRINIVIL,ZESTRIL) 20 MG tablet Take 1 tablet (20 mg total) by mouth daily. 08/27/15   Tanna Furry, MD  lisinopril (PRINIVIL,ZESTRIL) 20 MG tablet Take 1 tablet (20 mg total) by mouth daily. 09/28/15   Mercedes Camprubi-Soms, PA-C  lisinopril-hydrochlorothiazide (PRINZIDE,ZESTORETIC) 20-12.5 MG tablet Take 1 tablet by mouth daily. 05/15/15   Serita Grit, MD  loratadine (CLARITIN REDITABS) 10 MG dissolvable tablet Take 1 tablet (10 mg total) by mouth daily. 08/27/15   Tanna Furry, MD   BP 202/84 mmHg  Pulse 66  Temp(Src) 98 F (36.7 C) (Oral)  Resp 18  SpO2 100% Physical Exam  Constitutional: She is oriented to person, place, and time. She appears well-developed and well-nourished. No distress.  HENT:  Head: Normocephalic and atraumatic.  Nose: Mucosal edema and rhinorrhea  present.  Mouth/Throat: Oropharynx is clear and moist.  Eyes: Conjunctivae and EOM are normal. Pupils are equal, round, and reactive to light.  Neck: Normal range of motion. Neck supple.  Cardiovascular: Normal rate, regular rhythm and intact distal pulses.   No murmur heard. Pulmonary/Chest: Effort normal and breath sounds normal. No respiratory distress. She has no wheezes. She has no rales.  Abdominal: Soft. She exhibits no  distension. There is no tenderness. There is no rebound and no guarding.  Musculoskeletal: Normal range of motion. She exhibits no edema or tenderness.  Neurological: She is alert and oriented to person, place, and time.  Skin: Skin is warm and dry. No rash noted. No erythema.  Psychiatric: She has a normal mood and affect. Her behavior is normal.  Nursing note and vitals reviewed.   ED Course  Procedures (including critical care time) Labs Review Labs Reviewed  URINALYSIS, ROUTINE W REFLEX MICROSCOPIC (NOT AT [facility]) - Abnormal; Notable for the following:    APPearance CLOUDY (*)    Specific Gravity, Urine 1.038 (*)    All other components within normal limits    Imaging Review No results found. I have personally reviewed and evaluated these images and lab results as part of my medical decision-making.   EKG Interpretation None      MDM   Final diagnoses:  Yeast vaginitis  Essential hypertension  Environmental allergies    Patient is presenting today requesting a refill of her blood pressure medication. She has an appointment with [facility] but cannot see a physician told July. They told her to return to the emergency room to get a refill of her prescriptions. She is also complaining of dysuria over the last 1 week with minimal vaginal discharge.  She denies any current sexual contact and is not concerned she has an STD. She has a history of prior UTIs but denies any history of diabetes.  She is well-appearing here but hypertensive. She has been out of her meds for 8 days. UA pending  3:43 PM UA without signs of UTI. Patient treated with Diflucan. Also given prescriptions for her blood pressure medication.  Blanchie Dessert, MD 12/17/15 [id number]  Blanchie Dessert, MD 12/17/15 [phone]

## 2016-05-13 ENCOUNTER — Emergency Department (HOSPITAL_COMMUNITY)
Admission: EM | Admit: 2016-05-13 | Discharge: 2016-05-13 | Disposition: A | Discharge status: Home / Self Care | Payer: Medicaid Other | Attending: Emergency Medicine | Admitting: Emergency Medicine

## 2016-05-13 DIAGNOSIS — J0101 Acute recurrent maxillary sinusitis: Secondary | ICD-10-CM

## 2016-05-13 DIAGNOSIS — I10 Essential (primary) hypertension: Secondary | ICD-10-CM

## 2016-05-13 DIAGNOSIS — Z79899 Other long term (current) drug therapy: Secondary | ICD-10-CM | POA: Insufficient documentation

## 2016-05-13 DIAGNOSIS — F17210 Nicotine dependence, cigarettes, uncomplicated: Secondary | ICD-10-CM | POA: Insufficient documentation

## 2016-05-13 DIAGNOSIS — Z7982 Long term (current) use of aspirin: Secondary | ICD-10-CM | POA: Insufficient documentation

## 2016-05-13 DIAGNOSIS — Z760 Encounter for issue of repeat prescription: Secondary | ICD-10-CM

## 2016-05-13 MED ORDER — AMOXICILLIN-POT CLAVULANATE 875-125 MG PO TABS: 1.0000 | ORAL_TABLET | Freq: Two times a day (BID) | ORAL | 0 refills | Status: DC

## 2016-05-13 MED ORDER — AMOXICILLIN-POT CLAVULANATE 875-125 MG PO TABS
1.0000 | ORAL_TABLET | Freq: Two times a day (BID) | ORAL | Status: DC
  Administered 2016-05-13: 1 via ORAL
  Filled 2016-05-13: qty 1

## 2016-05-13 MED ORDER — LISINOPRIL-HYDROCHLOROTHIAZIDE 20-12.5 MG PO TABS: 1.0000 | ORAL_TABLET | Freq: Every day | ORAL | 2 refills | Status: DC

## 2016-05-13 MED ORDER — LISINOPRIL 20 MG PO TABS
20.0000 mg | ORAL_TABLET | Freq: Every day | ORAL | Status: DC
  Administered 2016-05-13: 20 mg via ORAL
  Filled 2016-05-13: qty 1

## 2016-05-13 NOTE — ED Provider Notes (Signed)
[facility] DEPT Provider Note   CSN: [id number] Arrival date & time: 05/13/16  1123     History   Chief Complaint Chief Complaint  Patient presents with  . Hypertension  . Medication Refill    HPI Cassandra Wilkinson is a 55 y.o. female.  HPI Pt reports HTN episodes at home and is HTN in triage. Pt c/o nausea and HA that she believes is r/t her HTN. Pt request HTN med refill. Pt A+OX4, speaking in complete sentences, ambulatory to triage.  Past Medical History:  Diagnosis Date  . Hypertension     There are no active problems to display for this patient.   Past Surgical History:  Procedure Laterality Date  . APPENDECTOMY    . CESAREAN SECTION    . TUBAL LIGATION      OB History    No data available       Home Medications    Prior to Admission medications   Medication Sig Start Date End Date Taking? Authorizing Provider  Aspirin-Salicylamide-Caffeine (BC HEADACHE POWDER PO) Take 1 Package by mouth every 4 (four) hours as needed (for pain).   Yes Historical Provider, MD  cetirizine (ZYRTEC) 10 MG tablet Take 1 tablet (10 mg total) by mouth daily. 12/17/15  Yes Blanchie Dessert, MD  sodium chloride (OCEAN) 0.65 % SOLN nasal spray Place 1 spray into both nostrils 2 (two) times daily as needed for congestion.   Yes Historical Provider, MD  amoxicillin-clavulanate (AUGMENTIN) 875-125 MG tablet Take 1 tablet by mouth 2 (two) times daily. 05/13/16   Leonard Schwartz, MD  fluticasone Asencion Islam) 50 MCG/ACT nasal spray 1 spray  Each naris bid Patient not taking: Reported on 05/13/2016 08/27/15   Tanna Furry, MD  lisinopril-hydrochlorothiazide (PRINZIDE,ZESTORETIC) 20-12.5 MG tablet Take 1 tablet by mouth daily. 05/13/16   Leonard Schwartz, MD    Family History Family History  Problem Relation Age of Onset  . Heart failure Mother   . Diabetes Mother     Social History Social History  Substance Use Topics  . Smoking status: Current Every Day Smoker    Packs/day: 0.50   Types: Cigarettes  . Smokeless tobacco: Never Used  . Alcohol use No     Allergies   Codeine and Food   Review of Systems Review of Systems All other systems reviewed and are negative  Physical Exam Updated Vital Signs BP 183/98 (BP Location: Right Arm)   Pulse (!) 58   Temp 98.2 F (36.8 C) (Oral)   Resp 18   Ht 5\' 6"  (1.676 m)   Wt 248 lb 12.8 oz (112.9 kg)   SpO2 100%   BMI 40.16 kg/m   Physical Exam Physical Exam  Nursing note and vitals reviewed. Constitutional: She is oriented to person, place, and time. She appears well-developed and well-nourished. No distress.  HENT:  Head: Normocephalic and atraumatic.  Tenderness to palpation over the right maxillary sinus consistent with sinusitis.   Eyes: Pupils are equal, round, and reactive to light.  Neck: Normal range of motion.  Cardiovascular: Normal rate and intact distal pulses.   Pulmonary/Chest: No respiratory distress.  Abdominal: Normal appearance. She exhibits no distension.  No CVA tenderness.  No suprapubic tenderness.   Musculoskeletal: Normal range of motion.  Neurological: She is alert and oriented to person, place, and time. No cranial nerve deficit.  Skin: Skin is warm and dry. No rash noted.  Psychiatric: She has a normal mood and affect. Her behavior is normal.    ED Treatments /  Results  Labs (all labs ordered are listed, but only abnormal results are displayed) Labs Reviewed - No data to display  EKG  EKG Interpretation None       Radiology No results found.  Procedures Procedures (including critical care time)  Medications Ordered in ED Medications  lisinopril (PRINIVIL,ZESTRIL) tablet 20 mg (not administered)  amoxicillin-clavulanate (AUGMENTIN) 875-125 MG per tablet 1 tablet (not administered)     Initial Impression / Assessment and Plan / ED Course  I have reviewed the triage vital signs and the nursing notes.  Pertinent labs & imaging results that were available during  my care of the patient were reviewed by me and considered in my medical decision making (see chart for details).  Clinical Course      Final Clinical Impressions(s) / ED Diagnoses   Final diagnoses:  Hypertension, unspecified type  Encounter for medication refill  Acute recurrent maxillary sinusitis    New Prescriptions Current Discharge Medication List       Leonard Schwartz, MD 05/13/16 1424

## 2016-05-13 NOTE — ED Triage Notes (Signed)
Pt reports HTN episodes at home and is HTN in triage. Pt c/o nausea and HA that she believes is r/t her HTN. Pt request HTN med refill. Pt A+OX4, speaking in complete sentences, ambulatory to triage.

## 2016-07-25 ENCOUNTER — Encounter (HOSPITAL_COMMUNITY): Payer: Self-pay | Admitting: Emergency Medicine

## 2016-07-25 ENCOUNTER — Emergency Department (HOSPITAL_COMMUNITY)
Admission: EM | Admit: 2016-07-25 | Discharge: 2016-07-25 | Disposition: A | Discharge status: Home / Self Care | Payer: Medicaid Other | Attending: Emergency Medicine | Admitting: Emergency Medicine

## 2016-07-25 DIAGNOSIS — H109 Unspecified conjunctivitis: Secondary | ICD-10-CM | POA: Insufficient documentation

## 2016-07-25 DIAGNOSIS — F17210 Nicotine dependence, cigarettes, uncomplicated: Secondary | ICD-10-CM | POA: Insufficient documentation

## 2016-07-25 DIAGNOSIS — I10 Essential (primary) hypertension: Secondary | ICD-10-CM | POA: Insufficient documentation

## 2016-07-25 DIAGNOSIS — Z79899 Other long term (current) drug therapy: Secondary | ICD-10-CM | POA: Insufficient documentation

## 2016-07-25 MED ORDER — POLYMYXIN B-TRIMETHOPRIM 10000-0.1 UNIT/ML-% OP SOLN: 2.0000 [drp] | OPHTHALMIC | 0 refills | Status: DC

## 2016-07-25 MED ORDER — ERYTHROMYCIN 5 MG/GM OP OINT: TOPICAL_OINTMENT | OPHTHALMIC | 0 refills | Status: DC

## 2016-07-25 MED ORDER — LORATADINE 10 MG PO TABS
10.0000 mg | ORAL_TABLET | Freq: Every day | ORAL | Status: DC
  Administered 2016-07-25: 10 mg via ORAL
  Filled 2016-07-25: qty 1

## 2016-07-25 MED ORDER — CETIRIZINE HCL 10 MG PO TABS: 10.0000 mg | ORAL_TABLET | Freq: Every day | ORAL | 0 refills | Status: DC

## 2016-07-25 NOTE — Discharge Instructions (Signed)
Start taking the erythromycin ointment 4 times daily for the next 5 days. Please also take Zyrtec daily. Follow-up with the ophthalmologist in 1-2 days. Return to the emergency department for new or concerning symptoms.

## 2016-07-25 NOTE — ED Provider Notes (Signed)
[facility] DEPT Provider Note   CSN: [id number] Arrival date & time: 07/25/16  1102  By signing my name below, I, Sonum Patel, attest that this documentation has been prepared under the direction and in the presence of Gloriann Loan, PA-C Electronically Signed: Sonum Patel, Education administrator. 07/25/16. 11:48 AM.  History   Chief Complaint Chief Complaint  Patient presents with  . Conjunctivitis   The history is provided by the patient. No language interpreter was used.     HPI Comments: Cassandra Wilkinson is a 56 y.o. female who presents to the Emergency Department complaining of gradual onset, constant right eye irritation with associated redness and drainage that began yesterday. She describes the drainage as a white liquid which was crusted this morning; states it is causing her blurry vision. She describes her irritation as a burning sensation. She states the symptoms began with some itching which resulted in her rubbing the affected eye. She denies known trauma to the affected area. She does not wear eyeglasses or contact lenses. She denies pain with eye movement, fever, HA, diplopia, or eye trauma.   Past Medical History:  Diagnosis Date  . Hypertension     There are no active problems to display for this patient.   Past Surgical History:  Procedure Laterality Date  . APPENDECTOMY    . CESAREAN SECTION    . TUBAL LIGATION      OB History    No data available       Home Medications    Prior to Admission medications   Medication Sig Start Date End Date Taking? Authorizing Provider  amoxicillin-clavulanate (AUGMENTIN) 875-125 MG tablet Take 1 tablet by mouth 2 (two) times daily. 05/13/16   Leonard Schwartz, MD  Aspirin-Salicylamide-Caffeine (BC HEADACHE POWDER PO) Take 1 Package by mouth every 4 (four) hours as needed (for pain).    Historical Provider, MD  cetirizine (ZYRTEC) 10 MG tablet Take 1 tablet (10 mg total) by mouth daily. 07/25/16   Gloriann Loan, PA-C  erythromycin  ophthalmic ointment Place a 1/2 inch ribbon of ointment into the lower eyelid 4 times daily for 5 days. 07/25/16   Gloriann Loan, PA-C  fluticasone (FLONASE) 50 MCG/ACT nasal spray 1 spray  Each naris bid Patient not taking: Reported on 05/13/2016 08/27/15   Tanna Furry, MD  lisinopril-hydrochlorothiazide (PRINZIDE,ZESTORETIC) 20-12.5 MG tablet Take 1 tablet by mouth daily. 05/13/16   Leonard Schwartz, MD  sodium chloride (OCEAN) 0.65 % SOLN nasal spray Place 1 spray into both nostrils 2 (two) times daily as needed for congestion.    Historical Provider, MD    Family History Family History  Problem Relation Age of Onset  . Heart failure Mother   . Diabetes Mother   . Cancer Father     Social History Social History  Substance Use Topics  . Smoking status: Current Every Day Smoker    Packs/day: 0.50    Types: Cigarettes  . Smokeless tobacco: Never Used  . Alcohol use No     Allergies   Codeine and Food   Review of Systems Review of Systems A complete 10 system review of systems was obtained and all systems are negative except as noted in the HPI and PMH.    Physical Exam Updated Vital Signs BP 162/90 (BP Location: Left Arm)   Pulse 73   Temp 97.5 F (36.4 C) (Oral)   Resp 14   Ht 5\' 6"  (1.676 m)   Wt 99.8 kg   LMP 06/24/2012   SpO2 100%  BMI 35.51 kg/m   Physical Exam  Constitutional: She is oriented to person, place, and time. She appears well-developed and well-nourished.  HENT:  Head: Normocephalic and atraumatic.  Right Ear: External ear normal.  Left Ear: External ear normal.  Eyes: EOM and lids are normal. Pupils are equal, round, and reactive to light. Lids are everted and swept, no foreign bodies found. Right eye exhibits discharge. Right eye exhibits no chemosis and no exudate. Left eye exhibits no chemosis, no discharge and no exudate. Right conjunctiva is injected. Left conjunctiva is not injected. No scleral icterus.  No pain with eye movement. Visual fields  intact.     Visual Acuity  Right Eye Distance: 20/30 Left Eye Distance: 20/40 Bilateral Distance: 20/30      Neck: No tracheal deviation present.  Pulmonary/Chest: Effort normal. No respiratory distress.  Abdominal: She exhibits no distension.  Musculoskeletal: Normal range of motion.  Neurological: She is alert and oriented to person, place, and time.  Skin: Skin is warm and dry.  Psychiatric: She has a normal mood and affect. Her behavior is normal.     ED Treatments / Results  DIAGNOSTIC STUDIES: Oxygen Saturation is 100% on RA, normal by my interpretation.    COORDINATION OF CARE: 11:51 AM Discussed treatment plan with pt at bedside and pt agreed to plan.   Labs (all labs ordered are listed, but only abnormal results are displayed) Labs Reviewed - No data to display  EKG  EKG Interpretation None       Radiology No results found.  Procedures Procedures (including critical care time)  Medications Ordered in ED Medications  loratadine (CLARITIN) tablet 10 mg (10 mg Oral Given 07/25/16 1146)     Initial Impression / Assessment and Plan / ED Course  I have reviewed the triage vital signs and the nursing notes.  Pertinent labs & imaging results that were available during my care of the patient were reviewed by me and considered in my medical decision making (see chart for details).  Clinical Course    Patient presentation consistent with conjunctivitis.  No evidence of corneal abrasions, entrapment, consensual photophobia, or herpes keratitis.  Presentation not concerning for iritis, or corneal abrasions.  Pt discharged with erythromycin and zyrtec.  Personal hygiene and frequent handwashing discussed.  Patient advised to follow up with ophthalmologist if symptoms persist or worsen. Return precautions discussed.  Patient verbalizes understanding and is agreeable with discharge.   Final Clinical Impressions(s) / ED Diagnoses   Final diagnoses:  Conjunctivitis  of right eye, unspecified conjunctivitis type    New Prescriptions New Prescriptions   CETIRIZINE (ZYRTEC) 10 MG TABLET    Take 1 tablet (10 mg total) by mouth daily.   ERYTHROMYCIN OPHTHALMIC OINTMENT    Place a 1/2 inch ribbon of ointment into the lower eyelid 4 times daily for 5 days.   I personally performed the services described in this documentation, which was scribed in my presence. The recorded information has been reviewed and is accurate.    Gloriann Loan, PA-C 07/25/16 [place], MD 07/26/16 [phone]

## 2016-07-25 NOTE — ED Triage Notes (Signed)
Pt c/o pain and itching in r/eye x 24 hours. R/eye appears red and irritated

## 2016-09-02 ENCOUNTER — Encounter (HOSPITAL_COMMUNITY): Payer: Self-pay | Admitting: Emergency Medicine

## 2016-09-02 ENCOUNTER — Emergency Department (HOSPITAL_COMMUNITY)
Admission: EM | Admit: 2016-09-02 | Discharge: 2016-09-02 | Disposition: A | Discharge status: Home / Self Care | Payer: Medicaid Other | Attending: Emergency Medicine | Admitting: Emergency Medicine

## 2016-09-02 DIAGNOSIS — I10 Essential (primary) hypertension: Secondary | ICD-10-CM

## 2016-09-02 DIAGNOSIS — B349 Viral infection, unspecified: Secondary | ICD-10-CM | POA: Insufficient documentation

## 2016-09-02 DIAGNOSIS — Z7982 Long term (current) use of aspirin: Secondary | ICD-10-CM | POA: Insufficient documentation

## 2016-09-02 DIAGNOSIS — F17210 Nicotine dependence, cigarettes, uncomplicated: Secondary | ICD-10-CM | POA: Insufficient documentation

## 2016-09-02 DIAGNOSIS — Z79899 Other long term (current) drug therapy: Secondary | ICD-10-CM | POA: Insufficient documentation

## 2016-09-02 LAB — RAPID STREP SCREEN (MED CTR MEBANE ONLY): Streptococcus, Group A Screen (Direct): NEGATIVE

## 2016-09-02 MED ORDER — HYDROCHLOROTHIAZIDE 12.5 MG PO CAPS
12.5000 mg | ORAL_CAPSULE | Freq: Once | ORAL | Status: AC
  Administered 2016-09-02: 12.5 mg via ORAL
  Filled 2016-09-02: qty 1

## 2016-09-02 MED ORDER — LISINOPRIL 20 MG PO TABS
20.0000 mg | ORAL_TABLET | Freq: Once | ORAL | Status: AC
  Administered 2016-09-02: 20 mg via ORAL
  Filled 2016-09-02: qty 1

## 2016-09-02 MED ORDER — LISINOPRIL-HYDROCHLOROTHIAZIDE 20-12.5 MG PO TABS: 1.0000 | ORAL_TABLET | Freq: Every day | ORAL | 1 refills | Status: DC

## 2016-09-02 NOTE — Discharge Instructions (Signed)
Please read attached information. If you experience any new or worsening signs or symptoms please return to the emergency room for evaluation. Please follow-up with your primary care provider or specialist as discussed. Please use medication prescribed only as directed and discontinue taking if you have any concerning signs or symptoms.   °

## 2016-09-02 NOTE — ED Provider Notes (Signed)
[facility] DEPT Provider Note   CSN: [id number] Arrival date & time: 09/02/16  0941  By signing my name below, I, Julien Nordmann, attest that this documentation has been prepared under the direction and in the presence of [employer], PA-C.  Electronically Signed: Julien Nordmann, ED Scribe. 09/02/16. 10:42 AM.    History   Chief Complaint Chief Complaint  Patient presents with  . Flu Like Symptoms   The history is provided by the patient. No language interpreter was used.   HPI Comments: Cassandra Wilkinson is a 56 y.o. female who has a PMhx of HTN presents to the Emergency Department presenting with gradual worsening, moderate, cold-like symptoms that began 5 days ago. She reports associated generalized myalgias/arthralgias, mildly cough, nasal congestion, sore throat, sinus pain, and a headache that she describes as a "migraine". Pt notes having a feverShe reports taking BC powders to alleviate her symptoms without relief. She further notes that her blood pressure has been high recently due to running out of her medication 2 weeks. She expresses her headache and sinus pain is worse when she leans forward. Pt has been around her son who currently has the flu and small children who have strep throat. She denies chest pain, shortness of breath, abdominal pain, numbness, tingling, weakness, visual disturbances, and photophobia.  Past Medical History:  Diagnosis Date  . Hypertension     There are no active problems to display for this patient.   Past Surgical History:  Procedure Laterality Date  . APPENDECTOMY    . CESAREAN SECTION    . TUBAL LIGATION      OB History    No data available       Home Medications    Prior to Admission medications   Medication Sig Start Date End Date Taking? Authorizing Provider  amoxicillin-clavulanate (AUGMENTIN) 875-125 MG tablet Take 1 tablet by mouth 2 (two) times daily. 05/13/16   Leonard Schwartz, MD  Aspirin-Salicylamide-Caffeine (BC  HEADACHE POWDER PO) Take 1 Package by mouth every 4 (four) hours as needed (for pain).    Historical Provider, MD  cetirizine (ZYRTEC) 10 MG tablet Take 1 tablet (10 mg total) by mouth daily. 07/25/16   Gloriann Loan, PA-C  erythromycin ophthalmic ointment Place a 1/2 inch ribbon of ointment into the lower eyelid 4 times daily for 5 days. 07/25/16   Gloriann Loan, PA-C  fluticasone (FLONASE) 50 MCG/ACT nasal spray 1 spray  Each naris bid Patient not taking: Reported on 05/13/2016 08/27/15   Tanna Furry, MD  lisinopril-hydrochlorothiazide (PRINZIDE,ZESTORETIC) 20-12.5 MG tablet Take 1 tablet by mouth daily. 09/02/16   Okey Regal, PA-C  sodium chloride (OCEAN) 0.65 % SOLN nasal spray Place 1 spray into both nostrils 2 (two) times daily as needed for congestion.    Historical Provider, MD    Family History Family History  Problem Relation Age of Onset  . Heart failure Mother   . Diabetes Mother   . Cancer Father     Social History Social History  Substance Use Topics  . Smoking status: Current Every Day Smoker    Packs/day: 0.50    Types: Cigarettes  . Smokeless tobacco: Never Used  . Alcohol use No     Allergies   Codeine and Food   Review of Systems Review of Systems  A complete 10 system review of systems was obtained and all systems are negative except as noted in the HPI and PMH.   Physical Exam Updated Vital Signs BP 195/98   Pulse 80  Temp 98.5 F (36.9 C) (Oral)   Resp 16   Ht 5\' 6"  (1.676 m)   Wt 99.8 kg   LMP 06/24/2012   SpO2 100%   BMI 35.51 kg/m   Physical Exam  Constitutional: She is oriented to person, place, and time. She appears well-developed and well-nourished.  HENT:  Head: Normocephalic.  Bilateral maxillary tenderness, no rhinorrhea, throat normal  Eyes: EOM are normal.  Neck: Normal range of motion.  Cardiovascular: Normal rate.   Pulmonary/Chest: Effort normal and breath sounds normal. No respiratory distress. She has no wheezes. She has no  rales.  Abdominal: She exhibits no distension.  Musculoskeletal: Normal range of motion.  Neurological: She is alert and oriented to person, place, and time. She displays normal reflexes. No cranial nerve deficit or sensory deficit. Coordination normal.  Psychiatric: She has a normal mood and affect.  Nursing note and vitals reviewed.    ED Treatments / Results  DIAGNOSTIC STUDIES: Oxygen Saturation is 100% on RA, normal by my interpretation.  COORDINATION OF CARE:  10:28 AM Discussed treatment plan with pt at bedside and pt agreed to plan.  Labs (all labs ordered are listed, but only abnormal results are displayed) Labs Reviewed  RAPID STREP SCREEN (NOT AT [facility])  CULTURE, GROUP A STREP [facility])    EKG  EKG Interpretation None       Radiology No results found.  Procedures Procedures (including critical care time)  Medications Ordered in ED Medications  lisinopril (PRINIVIL,ZESTRIL) tablet 20 mg (20 mg Oral Given 09/02/16 1114)  hydrochlorothiazide (MICROZIDE) capsule 12.5 mg (12.5 mg Oral Given 09/02/16 1114)     Initial Impression / Assessment and Plan / ED Course  I have reviewed the triage vital signs and the nursing notes.  Pertinent labs & imaging results that were available during my care of the patient were reviewed by me and considered in my medical decision making (see chart for details).       Final Clinical Impressions(s) / ED Diagnoses   Final diagnoses:  Viral illness  Hypertension, unspecified type   Labs: strep throat  Imaging: n/a  Consults: n/a  Therapeutics: one dose of Lisinopril  Discharge Meds: Lisinopril   Assessment/Plan: Patient presents with likely viral URI. She has signs consistent with sinusitis, no concerning signs for bacterial sinusitis at this time. Patient's headache is frontal and located over the maxillary region. Patient is hypertensive here, I have low suspicion for any end organ damage in this patient. She is  consistently noncompliant and she has difficulty following up with outpatient care. Patient will be given a dose of her blood pressure medication here, encouraged follow-up as an outpatient. Patient instructed to return if total duration of symptoms exceed 10 days, or signs or symptoms progressively worsened.. Discussed return precautions and she agreed.  I personally performed the services described in this documentation, which was scribed in my presence. The recorded information has been reviewed and is accurate.  New Prescriptions Discharge Medication List as of 09/02/2016 10:46 AM       Okey Regal, PA-C 09/02/16 [place], MD 09/02/16 [id number]

## 2016-09-02 NOTE — ED Notes (Signed)
Bed: WTR7 Expected date:  Expected time:  Means of arrival:  Comments: 

## 2016-09-02 NOTE — ED Triage Notes (Signed)
Patient is complaining of generalized body aches, sore throat, and headache since Wednesday. Patient has been out of her hypertension medication for 2 weeks.

## 2016-09-04 LAB — CULTURE, GROUP A STREP (THRC)

## 2016-11-17 ENCOUNTER — Emergency Department (HOSPITAL_COMMUNITY)
Admission: EM | Admit: 2016-11-17 | Discharge: 2016-11-17 | Disposition: A | Discharge status: Home / Self Care | Payer: Medicaid Other | Attending: Emergency Medicine | Admitting: Emergency Medicine

## 2016-11-17 ENCOUNTER — Encounter (HOSPITAL_COMMUNITY): Payer: Self-pay

## 2016-11-17 DIAGNOSIS — R140 Abdominal distension (gaseous): Secondary | ICD-10-CM | POA: Insufficient documentation

## 2016-11-17 DIAGNOSIS — F17210 Nicotine dependence, cigarettes, uncomplicated: Secondary | ICD-10-CM | POA: Insufficient documentation

## 2016-11-17 DIAGNOSIS — N898 Other specified noninflammatory disorders of vagina: Secondary | ICD-10-CM | POA: Insufficient documentation

## 2016-11-17 DIAGNOSIS — Z7982 Long term (current) use of aspirin: Secondary | ICD-10-CM | POA: Insufficient documentation

## 2016-11-17 DIAGNOSIS — I10 Essential (primary) hypertension: Secondary | ICD-10-CM | POA: Insufficient documentation

## 2016-11-17 LAB — URINALYSIS, ROUTINE W REFLEX MICROSCOPIC
Bilirubin Urine: NEGATIVE
Glucose, UA: NEGATIVE mg/dL
Ketones, ur: NEGATIVE mg/dL
Leukocytes, UA: NEGATIVE
Nitrite: NEGATIVE
Protein, ur: 30 mg/dL — AB
Specific Gravity, Urine: 1.032 — ABNORMAL HIGH (ref 1.005–1.030)
pH: 5 (ref 5.0–8.0)

## 2016-11-17 LAB — PREGNANCY, URINE: Preg Test, Ur: NEGATIVE

## 2016-11-17 LAB — WET PREP, GENITAL
Clue Cells Wet Prep HPF POC: NONE SEEN
Sperm: NONE SEEN
Trich, Wet Prep: NONE SEEN
WBC, Wet Prep HPF POC: NONE SEEN
Yeast Wet Prep HPF POC: NONE SEEN

## 2016-11-17 MED ORDER — HYDROCHLOROTHIAZIDE 12.5 MG PO CAPS
12.5000 mg | ORAL_CAPSULE | Freq: Once | ORAL | Status: AC
  Administered 2016-11-17: 12.5 mg via ORAL
  Filled 2016-11-17: qty 1

## 2016-11-17 MED ORDER — LISINOPRIL 20 MG PO TABS
20.0000 mg | ORAL_TABLET | Freq: Once | ORAL | Status: AC
  Administered 2016-11-17: 20 mg via ORAL
  Filled 2016-11-17: qty 1

## 2016-11-17 MED ORDER — LISINOPRIL-HYDROCHLOROTHIAZIDE 20-12.5 MG PO TABS: 1.0000 | ORAL_TABLET | Freq: Every day | ORAL | 1 refills | Status: DC

## 2016-11-17 NOTE — ED Provider Notes (Signed)
[facility] DEPT Provider Note   CSN: [id number] Arrival date & time: 11/17/16  1039     History   Chief Complaint Chief Complaint  Patient presents with  . Hypertension  . Vaginitis    HPI Cassandra Wilkinson is a 56 y.o. female with history of hypertension presents today with chief complaint progressively worsening high blood pressure, mild sinus pressure, vaginal odor and discharge. She states she ran out of her lisinopril-HCTZ 20-12 0.5 mg tablets for 12 days. Due to financial constraints she tends to take her medication every other day instead of daily.  She states she has been trying to establish care with [facility], "but they are still processing my application ". She states she has been taking vinegar and mustard as a home remedy. She states she has had mild constant sinus pressure and frontal headache for 1 week with nasal congestion. She took Sudafed yesterday which helped her symptoms and noticed an increase in her blood pressure. She takes her blood pressure daily and noted her systolic was greater than 200 today. She denies fever, chills, LOC, numbness, tingling, weakness, abdominal pain, n/v/d, sore throat, ear pain, neck pain or back pain. She states her appetite has been normal.  She is also complaining of acute onset vaginal odor and creamy yellow discharge for 6 days, with associated itchiness. She used monistat cream once. Mild abdominal cramping. She endorses urinary frequency, denies dysuria, no oliguria, no hematuria.    The history is provided by the patient.    Past Medical History:  Diagnosis Date  . Hypertension     There are no active problems to display for this patient.   Past Surgical History:  Procedure Laterality Date  . APPENDECTOMY    . CESAREAN SECTION    . TUBAL LIGATION      OB History    No data available       Home Medications    Prior to Admission medications   Medication Sig Start Date End Date Taking?  Authorizing Provider  Aspirin-Salicylamide-Caffeine (BC HEADACHE POWDER PO) Take 1 Package by mouth every 4 (four) hours as needed (for pain).   Yes Historical Provider, MD  pseudoephedrine (SUDAFED) 120 MG 12 hr tablet Take 120 mg by mouth 2 (two) times daily as needed for congestion.    Yes Historical Provider, MD  sodium chloride (OCEAN) 0.65 % SOLN nasal spray Place 1 spray into both nostrils 2 (two) times daily as needed for congestion.   Yes Historical Provider, MD  amoxicillin-clavulanate (AUGMENTIN) 875-125 MG tablet Take 1 tablet by mouth 2 (two) times daily. Patient not taking: Reported on 11/17/2016 05/13/16   Leonard Schwartz, MD  cetirizine (ZYRTEC) 10 MG tablet Take 1 tablet (10 mg total) by mouth daily. Patient not taking: Reported on 11/17/2016 07/25/16   Gloriann Loan, PA-C  erythromycin ophthalmic ointment Place a 1/2 inch ribbon of ointment into the lower eyelid 4 times daily for 5 days. Patient not taking: Reported on 11/17/2016 07/25/16   Gloriann Loan, PA-C  fluticasone [facility]) 50 MCG/ACT nasal spray 1 spray  Each naris bid Patient not taking: Reported on 11/17/2016 08/27/15   Tanna Furry, MD  lisinopril-hydrochlorothiazide (PRINZIDE,ZESTORETIC) 20-12.5 MG tablet Take 1 tablet by mouth daily. 11/17/16   Lacretia Leigh, MD    Family History Family History  Problem Relation Age of Onset  . Heart failure Mother   . Diabetes Mother   . Cancer Father     Social History Social History  Substance Use  Topics  . Smoking status: Current Every Day Smoker    Packs/day: 0.50    Types: Cigarettes  . Smokeless tobacco: Never Used  . Alcohol use No     Allergies   Codeine and Food   Review of Systems Review of Systems  Constitutional: Negative for chills and fever.  HENT: Positive for sinus pressure. Negative for ear pain, hearing loss, sinus pain, sore throat and tinnitus.   Eyes: Negative for photophobia, pain and visual disturbance.  Respiratory: Negative for shortness of breath.     Cardiovascular: Negative for chest pain.  Gastrointestinal: Negative for abdominal pain, blood in stool, diarrhea, nausea and vomiting.  Genitourinary: Positive for urgency and vaginal discharge. Negative for dysuria, hematuria, vaginal bleeding and vaginal pain.  Musculoskeletal: Negative for back pain and neck pain.  Neurological: Positive for headaches.     Physical Exam Updated Vital Signs BP (!) 163/83 (BP Location: Right Arm)   Pulse 65   Temp 98.8 F (37.1 C)   Resp 17   LMP 06/24/2012   SpO2 98%   Physical Exam  Constitutional: She is oriented to person, place, and time.  HENT:  Head: Normocephalic and atraumatic.  Right Ear: External ear normal.  Left Ear: External ear normal.  Mouth/Throat: Oropharynx is clear and moist.  Nasal septum midline, with pink mucosa and clear and greenish nasal discharge right greater than left. Mild TTP of right maxillary sinus, no TTP or frontal sinuses, TMs normal bilaterally  Eyes: Conjunctivae and EOM are normal. Pupils are equal, round, and reactive to light. Right eye exhibits no discharge. Left eye exhibits no discharge. No scleral icterus.  No papilledema  Neck: Neck supple. JVD present. Tracheal deviation present.  Cardiovascular: Normal rate, regular rhythm, normal heart sounds and intact distal pulses.   2+ radial pulses bl  Pulmonary/Chest: Effort normal and breath sounds normal. No respiratory distress. She has no wheezes.  Abdominal: Soft. Bowel sounds are normal. She exhibits distension. There is tenderness. There is guarding.  Genitourinary:  Genitourinary Comments: No cva tenderness. Chaperone present for examination. Cervical os is closed with no bleeding or drainage. final mucosa appears normal, vaginal wall appears normal. No CMT, adnexal tenderness, or tenderness to palpation of the uterus  Musculoskeletal: Normal range of motion. She exhibits no tenderness.  5/5 strength BUE  And BLE  Lymphadenopathy:    She has  cervical adenopathy.  Neurological: She is alert and oriented to person, place, and time. No cranial nerve deficit or sensory deficit.  Fluent speech, no facial droop, sensation intact globally, normal gait  Skin: Skin is warm and dry. Capillary refill takes less than 2 seconds. No rash noted. No erythema.  Psychiatric: She has a normal mood and affect. Her behavior is normal.     ED Treatments / Results  Labs (all labs ordered are listed, but only abnormal results are displayed) Labs Reviewed  URINALYSIS, ROUTINE W REFLEX MICROSCOPIC - Abnormal; Notable for the following:       Result Value   APPearance HAZY (*)    Specific Gravity, Urine 1.032 (*)    Hgb urine dipstick SMALL (*)    Protein, ur 30 (*)    Bacteria, UA RARE (*)    Squamous Epithelial / LPF 0-5 (*)    All other components within normal limits  WET PREP, GENITAL  PREGNANCY, URINE  GC/CHLAMYDIA PROBE AMP ([place]) NOT AT [facility]    EKG  EKG Interpretation None       Radiology No  results found.  Procedures Procedures (including critical care time)  Medications Ordered in ED Medications  lisinopril (PRINIVIL,ZESTRIL) tablet 20 mg (20 mg Oral Given 11/17/16 1237)  hydrochlorothiazide (MICROZIDE) capsule 12.5 mg (12.5 mg Oral Given 11/17/16 1237)     Initial Impression / Assessment and Plan / ED Course  I have reviewed the triage vital signs and the nursing notes.  Pertinent labs & imaging results that were available during my care of the patient were reviewed by me and considered in my medical decision making (see chart for details).     Patient with known hypertension who has been out of her medication for 2 weeks presents with hypertension, frontal sinus headache, vaginal itching and discharge. Afebrile, hypertensive on arrival. Given her usual dose of lisinopril-HCTZ with improvement. No signs of end organ damage. On reevaluation patient states her frontal headache and sinus pressure feels much better.  Possible sinusitis, and discussed use of humidifier and Flonase as needed for congestion. UA not concerning for UTI. Pelvic exam with white discharge, wet prep unremarkable. Pt possibly had yeast infection which she treated herself with OTC monistat.  Awaiting results of GC chlamydia, informed patient that she will be informed if results are abnormal. Pt denied HIV/syphilis treatment. Low suspicion PID, TOA, ovarian torsion. Provided with 2 month supply of hypertension medication, strongly urged patient to establish care with PCP, which she understands.Discussed strict ED return precautions. Pt verbalized understanding of and agreement with plan and is safe for discharge home at this time.    Final Clinical Impressions(s) / ED Diagnoses   Final diagnoses:  Hypertension, unspecified type  Vaginal itching    New Prescriptions Discharge Medication List as of 11/17/2016  2:46 PM       Renita Papa, PA-C 11/17/16 2143    Lacretia Leigh, MD 11/18/16 [phone]

## 2016-11-17 NOTE — ED Triage Notes (Signed)
She states she has been unable to obtain her b/p meds "for a few weeks" d/t "insurance issues". She also states "It feels like I have sinusitis and a yeast infection". She is in no distress.

## 2016-11-17 NOTE — Discharge Instructions (Signed)
Take your medication daily as prescribed, continue to check your blood pressure. Flonase, humidifier can be helpful for nasal congestion/sinus pressure. Establish care with a primary care provider as soon as you can for reevaluation of your hypertension. Return to the ED if any concerning symptoms develop.

## 2016-11-17 NOTE — ED Notes (Signed)
Bed: WA06 Expected date:  Expected time:  Means of arrival:  Comments: 

## 2016-11-19 LAB — GC/CHLAMYDIA PROBE AMP (~~LOC~~) NOT AT ARMC
Chlamydia: NEGATIVE
Neisseria Gonorrhea: NEGATIVE

## 2017-04-13 ENCOUNTER — Emergency Department (HOSPITAL_COMMUNITY)
Admission: EM | Admit: 2017-04-13 | Discharge: 2017-04-13 | Disposition: A | Discharge status: Home / Self Care | Payer: Self-pay | Attending: Emergency Medicine | Admitting: Emergency Medicine

## 2017-04-13 ENCOUNTER — Encounter (HOSPITAL_COMMUNITY): Payer: Self-pay | Admitting: Emergency Medicine

## 2017-04-13 DIAGNOSIS — I10 Essential (primary) hypertension: Secondary | ICD-10-CM | POA: Insufficient documentation

## 2017-04-13 DIAGNOSIS — J0100 Acute maxillary sinusitis, unspecified: Secondary | ICD-10-CM | POA: Insufficient documentation

## 2017-04-13 DIAGNOSIS — Z760 Encounter for issue of repeat prescription: Secondary | ICD-10-CM

## 2017-04-13 DIAGNOSIS — F17210 Nicotine dependence, cigarettes, uncomplicated: Secondary | ICD-10-CM | POA: Insufficient documentation

## 2017-04-13 MED ORDER — FLUTICASONE PROPIONATE 50 MCG/ACT NA SUSP: 1.0000 | Freq: Every day | NASAL | 1 refills | Status: DC

## 2017-04-13 MED ORDER — AMOXICILLIN-POT CLAVULANATE 875-125 MG PO TABS: 1.0000 | ORAL_TABLET | Freq: Two times a day (BID) | ORAL | 0 refills | Status: DC

## 2017-04-13 MED ORDER — LISINOPRIL-HYDROCHLOROTHIAZIDE 20-12.5 MG PO TABS: 1.0000 | ORAL_TABLET | Freq: Every day | ORAL | 1 refills | Status: DC

## 2017-04-13 NOTE — ED Triage Notes (Signed)
Patient c/o productive cough, congestion, chills and headache x1 week. Denies chest pain and SOB. Patient also reports she has been out of her BP medication x3 weeks. Hypertensive in triage.

## 2017-04-13 NOTE — ED Provider Notes (Signed)
[facility] DEPT Provider Note   CSN: [id number] Arrival date & time: 04/13/17  1219     History   Chief Complaint Chief Complaint  Patient presents with  . Nasal Congestion  . Cough  . Hypertension    HPI Lavern Crimi is a 56 y.o. female with hx of HTN who presents to the ED with nasal congestion, sinus pressure and cough. She thinks the cough is from the post nasal drainage. She denies feeling short of breath.  Patient also c/o being out of her BP medication x 2 weeks. She reports that she is waiting on the [place] and to see a doctor. Patient states that when she had a sinus infection before she was treated with Augmentin and it took care of the problem.   HPI  Past Medical History:  Diagnosis Date  . Hypertension     There are no active problems to display for this patient.   Past Surgical History:  Procedure Laterality Date  . APPENDECTOMY    . CESAREAN SECTION    . TUBAL LIGATION      OB History    No data available       Home Medications    Prior to Admission medications   Medication Sig Start Date End Date Taking? Authorizing Provider  amoxicillin-clavulanate (AUGMENTIN) 875-125 MG tablet Take 1 tablet by mouth every 12 (twelve) hours. 04/13/17   Ashley Murrain, NP  Aspirin-Salicylamide-Caffeine (BC HEADACHE POWDER PO) Take 1 Package by mouth every 4 (four) hours as needed (for pain).    [provider]  cetirizine (ZYRTEC) 10 MG tablet Take 1 tablet (10 mg total) by mouth daily. Patient not taking: Reported on 11/17/2016 07/25/16   Gloriann Loan, PA-C  erythromycin ophthalmic ointment Place a 1/2 inch ribbon of ointment into the lower eyelid 4 times daily for 5 days. Patient not taking: Reported on 11/17/2016 07/25/16   Gloriann Loan, PA-C  fluticasone [facility]) 50 MCG/ACT nasal spray Place 1 spray into both nostrils daily. 04/13/17   Ashley Murrain, NP  lisinopril-hydrochlorothiazide (ZESTORETIC) 20-12.5 MG tablet Take 1 tablet by  mouth daily. 04/13/17   Ashley Murrain, NP  pseudoephedrine (SUDAFED) 120 MG 12 hr tablet Take 120 mg by mouth 2 (two) times daily as needed for congestion.     [provider]  sodium chloride (OCEAN) 0.65 % SOLN nasal spray Place 1 spray into both nostrils 2 (two) times daily as needed for congestion.    [provider]    Family History Family History  Problem Relation Age of Onset  . Heart failure Mother   . Diabetes Mother   . Cancer Father     Social History Social History  Substance Use Topics  . Smoking status: Current Every Day Smoker    Packs/day: 0.50    Types: Cigarettes  . Smokeless tobacco: Never Used  . Alcohol use No     Allergies   Codeine and Food   Review of Systems Review of Systems  Constitutional: Negative for chills and fever.  HENT: Positive for congestion, sinus pain and sinus pressure. Negative for sore throat and trouble swallowing.   Eyes: Negative for visual disturbance.  Respiratory: Positive for cough. Negative for chest tightness and shortness of breath.   Cardiovascular: Negative for chest pain.  Gastrointestinal: Negative for abdominal pain, diarrhea, nausea and vomiting.  Genitourinary: Negative for dysuria, frequency and urgency.  Musculoskeletal: Negative for back pain and myalgias.  Skin: Negative for rash.  Neurological: Negative for dizziness and syncope. Headaches: sinus presssure.  Psychiatric/Behavioral: Negative for confusion.     Physical Exam Updated Vital Signs BP (!) 191/92 (BP Location: Right Arm)   Pulse 66   Temp 98 F (36.7 C) (Oral)   Resp 18   LMP 06/24/2012   SpO2 100%   Physical Exam  Constitutional: She appears well-developed and well-nourished. No distress.  HENT:  Head: Normocephalic.  Right Ear: Tympanic membrane normal.  Left Ear: Tympanic membrane normal.  Nose: Mucosal edema and rhinorrhea present. Right sinus exhibits maxillary sinus tenderness. Left sinus exhibits maxillary  sinus tenderness.  Mouth/Throat: Uvula is midline and mucous membranes are normal. Posterior oropharyngeal erythema present. No posterior oropharyngeal edema.  Eyes: EOM are normal.  Neck: Neck supple.  Cardiovascular: Normal rate and regular rhythm.   Pulmonary/Chest: Effort normal and breath sounds normal.  Abdominal: Soft. There is no tenderness.  Musculoskeletal:  No pitting edema  Lymphadenopathy:    She has cervical adenopathy.  Neurological: She is alert.  Skin: Skin is warm and dry.  Psychiatric: She has a normal mood and affect. Her behavior is normal.  Nursing note and vitals reviewed.    ED Treatments / Results  Labs (all labs ordered are listed, but only abnormal results are displayed) Labs Reviewed - No data to display  Radiology No results found.  Procedures Procedures (including critical care time)  Medications Ordered in ED Medications - No data to display Discussed with the patient doing a CXR to be sure there is no fluid. Patient states she does not want an x-ray just her medications. She states if she get short of breath or symptoms worsen she will return.  Initial Impression / Assessment and Plan / ED Course  I have reviewed the triage vital signs and the nursing notes.  56 y.o. female with sinus pressure and pain, post nasal drainage and need for medication refill stable for d/c without fever, shortness of breath and does not appear toxic. Will treat for sinusitis and refill BP medication. Patient to try and get in [facility] for follow up. Return precautions discussed.  Final Clinical Impressions(s) / ED Diagnoses   Final diagnoses:  Acute maxillary sinusitis, recurrence not specified  Medication refill    New Prescriptions Discharge Medication List as of 04/13/2017  5:15 PM       Debroah Baller Franklin, NP 04/14/17 0044    Lacretia Leigh, MD 04/18/17 1134

## 2017-04-13 NOTE — Discharge Instructions (Signed)
Call [facility] for follow up.

## 2017-06-29 ENCOUNTER — Emergency Department (HOSPITAL_COMMUNITY)
Admission: EM | Admit: 2017-06-29 | Discharge: 2017-06-29 | Disposition: A | Discharge status: Home / Self Care | Payer: Self-pay | Attending: Emergency Medicine | Admitting: Emergency Medicine

## 2017-06-29 ENCOUNTER — Encounter (HOSPITAL_COMMUNITY): Payer: Self-pay | Admitting: Emergency Medicine

## 2017-06-29 DIAGNOSIS — Z9114 Patient's other noncompliance with medication regimen: Secondary | ICD-10-CM | POA: Insufficient documentation

## 2017-06-29 DIAGNOSIS — M25562 Pain in left knee: Secondary | ICD-10-CM | POA: Insufficient documentation

## 2017-06-29 DIAGNOSIS — Z79899 Other long term (current) drug therapy: Secondary | ICD-10-CM | POA: Insufficient documentation

## 2017-06-29 DIAGNOSIS — I10 Essential (primary) hypertension: Secondary | ICD-10-CM | POA: Insufficient documentation

## 2017-06-29 DIAGNOSIS — F17210 Nicotine dependence, cigarettes, uncomplicated: Secondary | ICD-10-CM | POA: Insufficient documentation

## 2017-06-29 DIAGNOSIS — R51 Headache: Secondary | ICD-10-CM | POA: Insufficient documentation

## 2017-06-29 LAB — I-STAT CHEM 8, ED
BUN: 19 mg/dL (ref 6–20)
CHLORIDE: 109 mmol/L (ref 101–111)
Calcium, Ion: 1.15 mmol/L (ref 1.15–1.40)
Creatinine, Ser: 0.7 mg/dL (ref 0.44–1.00)
Glucose, Bld: 89 mg/dL (ref 65–99)
HCT: 40 % (ref 36.0–46.0)
Hemoglobin: 13.6 g/dL (ref 12.0–15.0)
POTASSIUM: 3.6 mmol/L (ref 3.5–5.1)
SODIUM: 144 mmol/L (ref 135–145)
TCO2: 23 mmol/L (ref 22–32)

## 2017-06-29 MED ORDER — ACETAMINOPHEN 500 MG PO TABS
1000.0000 mg | ORAL_TABLET | Freq: Once | ORAL | Status: AC
  Administered 2017-06-29: 1000 mg via ORAL
  Filled 2017-06-29: qty 2

## 2017-06-29 MED ORDER — LISINOPRIL-HYDROCHLOROTHIAZIDE 20-12.5 MG PO TABS: 1.0000 | ORAL_TABLET | Freq: Every day | ORAL | 0 refills | Status: AC

## 2017-06-29 NOTE — ED Provider Notes (Signed)
[facility] DEPT Provider Note   CSN: [id number] Arrival date & time: 06/29/17  6761     History   Chief Complaint Chief Complaint  Patient presents with  . Facial Pain  . Knee Pain  . Hypertension    HPI Cassandra Wilkinson is a 56 y.o. female.  Complains of sinus pressure onset 6 days ago also complains of left knee pain intermittent for 3 days.  It hurts worse "when the weather gets bad" patient states "I am calling it gout although I have never been told I have gout."  Patient also reports that her blood pressure was high.  She ran out of her blood pressure medicine 2 weeks ago.  No other associated symptoms.  The pain is worse with bad weather.  Treat herself with BC powder HPI  Past Medical History:  Diagnosis Date  . Hypertension     There are no active problems to display for this patient.   Past Surgical History:  Procedure Laterality Date  . APPENDECTOMY    . CESAREAN SECTION    . TUBAL LIGATION      OB History    No data available       Home Medications    Prior to Admission medications   Medication Sig Start Date End Date Taking? Authorizing Provider  amoxicillin-clavulanate (AUGMENTIN) 875-125 MG tablet Take 1 tablet by mouth every 12 (twelve) hours. 04/13/17   Ashley Murrain, NP  Aspirin-Salicylamide-Caffeine (BC HEADACHE POWDER PO) Take 1 Package by mouth every 4 (four) hours as needed (for pain).    [provider]  cetirizine (ZYRTEC) 10 MG tablet Take 1 tablet (10 mg total) by mouth daily. Patient not taking: Reported on 11/17/2016 07/25/16   Gloriann Loan, PA-C  erythromycin ophthalmic ointment Place a 1/2 inch ribbon of ointment into the lower eyelid 4 times daily for 5 days. Patient not taking: Reported on 11/17/2016 07/25/16   Gloriann Loan, PA-C  fluticasone [facility]) 50 MCG/ACT nasal spray Place 1 spray into both nostrils daily. 04/13/17   Ashley Murrain, NP  lisinopril-hydrochlorothiazide (ZESTORETIC) 20-12.5 MG  tablet Take 1 tablet by mouth daily. 04/13/17   Ashley Murrain, NP  pseudoephedrine (SUDAFED) 120 MG 12 hr tablet Take 120 mg by mouth 2 (two) times daily as needed for congestion.     [provider]  sodium chloride (OCEAN) 0.65 % SOLN nasal spray Place 1 spray into both nostrils 2 (two) times daily as needed for congestion.    [provider]    Family History Family History  Problem Relation Age of Onset  . Heart failure Mother   . Diabetes Mother   . Cancer Father     Social History Social History   Tobacco Use  . Smoking status: Current Every Day Smoker    Packs/day: 0.50    Types: Cigarettes  . Smokeless tobacco: Never Used  Substance Use Topics  . Alcohol use: No  . Drug use: No     Allergies   Codeine and Food   Review of Systems Review of Systems  Constitutional: Negative.   HENT: Positive for sinus pressure.   Respiratory: Negative.   Cardiovascular: Negative.   Gastrointestinal: Negative.   Musculoskeletal: Positive for arthralgias.       Knee pain  Skin: Negative.   Neurological: Negative.   Psychiatric/Behavioral: Negative.   All other systems reviewed and are negative.    Physical Exam Updated Vital Signs BP (!) 187/86 (BP Location: Right Arm)  Pulse 76   Temp 98 F (36.7 C) (Oral)   Resp 18   LMP 06/24/2012   SpO2 100%   Physical Exam  Constitutional: She appears well-developed and well-nourished. No distress.  HENT:  Head: Normocephalic and atraumatic.  Eyes: Conjunctivae are normal. Pupils are equal, round, and reactive to light.  Neck: Neck supple. No tracheal deviation present. No thyromegaly present.  Cardiovascular: Normal rate and regular rhythm.  No murmur heard. Pulmonary/Chest: Effort normal and breath sounds normal.  Abdominal: Soft. Bowel sounds are normal. She exhibits no distension. There is no tenderness.  Obese  Musculoskeletal: Normal range of motion. She exhibits no edema or tenderness.  All 4  extremities without redness swelling or tenderness neurovascularly intact  Neurological: She is alert. Coordination normal.  Skin: Skin is warm and dry. No rash noted.  Psychiatric: She has a normal mood and affect.  Nursing note and vitals reviewed.    ED Treatments / Results  Labs (all labs ordered are listed, but only abnormal results are displayed) Labs Reviewed - No data to display  EKG  EKG Interpretation None      Results for orders placed or performed during the hospital encounter of 06/29/17  I-stat chem 8, ed  Result Value Ref Range   Sodium 144 135 - 145 mmol/L   Potassium 3.6 3.5 - 5.1 mmol/L   Chloride 109 101 - 111 mmol/L   BUN 19 6 - 20 mg/dL   Creatinine, Ser 0.70 0.44 - 1.00 mg/dL   Glucose, Bld 89 65 - 99 mg/dL   Calcium, Ion 1.15 1.15 - 1.40 mmol/L   TCO2 23 22 - 32 mmol/L   Hemoglobin 13.6 12.0 - 15.0 g/dL   HCT 40.0 36.0 - 46.0 %   No results found. Radiology No results found.  Procedures Procedures (including critical care time)  Medications Ordered in ED Medications - No data to display   Initial Impression / Assessment and Plan / ED Course  I have reviewed the triage vital signs and the nursing notes.  Pertinent labs & imaging results that were available during my care of the patient were reviewed by me and considered in my medical decision making (see chart for details).     Doubt gout or infectious etiology of knee pain. 825  AM pain improved after treatment with Tylenol. I counseled patient for 5 minutes on smoking cessation. Suggest Benadryl for sinus congestion.  Tylenol for knee pain Prescription Zestoretic.  Referral primary care. Final Clinical Impressions(s) / ED Diagnoses  Diagnoses #1 sinus headache #2 knee pain #3 essential hypertension #4 medication noncompliance #5 tobacco abuse Final diagnoses:  None    ED Discharge Orders    None       Orlie Dakin, MD 06/29/17 [phone]

## 2017-06-29 NOTE — ED Triage Notes (Signed)
Patient here from home with complaints of sinus pain that started 3 days ago. Hypertension, out of meds. Right knee pain x1 week. Ambulatory. "I think it is gout".

## 2017-06-29 NOTE — Discharge Instructions (Addendum)
Take Tylenol as directed for knee pain.  You can take Benadryl 50 mg every 6 hours as needed for sinus congestion.  Take the blood pressure medication as prescribed.  Call the number on these instructions on Monday, July 01, 2017 to arrange to get a primary care physician.  Ask your new primary care physician to help you to stop smoking your blood pressure should be rechecked within a week.  You can get rechecked at an urgent care center.

## 2017-07-05 ENCOUNTER — Other Ambulatory Visit: Payer: Self-pay

## 2017-07-05 ENCOUNTER — Encounter (HOSPITAL_COMMUNITY): Payer: Self-pay

## 2017-07-05 ENCOUNTER — Emergency Department (HOSPITAL_COMMUNITY)
Admission: EM | Admit: 2017-07-05 | Discharge: 2017-07-05 | Disposition: A | Discharge status: Home / Self Care | Payer: Self-pay | Attending: Emergency Medicine | Admitting: Emergency Medicine

## 2017-07-05 ENCOUNTER — Encounter (HOSPITAL_COMMUNITY): Payer: Self-pay | Admitting: Emergency Medicine

## 2017-07-05 DIAGNOSIS — W268XXA Contact with other sharp object(s), not elsewhere classified, initial encounter: Secondary | ICD-10-CM | POA: Insufficient documentation

## 2017-07-05 DIAGNOSIS — Y999 Unspecified external cause status: Secondary | ICD-10-CM | POA: Insufficient documentation

## 2017-07-05 DIAGNOSIS — Z79899 Other long term (current) drug therapy: Secondary | ICD-10-CM | POA: Insufficient documentation

## 2017-07-05 DIAGNOSIS — S61411D Laceration without foreign body of right hand, subsequent encounter: Secondary | ICD-10-CM

## 2017-07-05 DIAGNOSIS — F17210 Nicotine dependence, cigarettes, uncomplicated: Secondary | ICD-10-CM | POA: Insufficient documentation

## 2017-07-05 DIAGNOSIS — S61411A Laceration without foreign body of right hand, initial encounter: Secondary | ICD-10-CM | POA: Insufficient documentation

## 2017-07-05 DIAGNOSIS — Y929 Unspecified place or not applicable: Secondary | ICD-10-CM | POA: Insufficient documentation

## 2017-07-05 DIAGNOSIS — Y939 Activity, unspecified: Secondary | ICD-10-CM | POA: Insufficient documentation

## 2017-07-05 DIAGNOSIS — Y9389 Activity, other specified: Secondary | ICD-10-CM | POA: Insufficient documentation

## 2017-07-05 DIAGNOSIS — Y92018 Other place in single-family (private) house as the place of occurrence of the external cause: Secondary | ICD-10-CM | POA: Insufficient documentation

## 2017-07-05 DIAGNOSIS — I10 Essential (primary) hypertension: Secondary | ICD-10-CM | POA: Insufficient documentation

## 2017-07-05 DIAGNOSIS — S61419A Laceration without foreign body of unspecified hand, initial encounter: Secondary | ICD-10-CM

## 2017-07-05 MED ORDER — CEPHALEXIN 500 MG PO CAPS
500.0000 mg | ORAL_CAPSULE | Freq: Once | ORAL | Status: AC
  Administered 2017-07-05: 500 mg via ORAL
  Filled 2017-07-05: qty 1

## 2017-07-05 MED ORDER — LIDOCAINE HCL (PF) 1 % IJ SOLN
10.0000 mL | Freq: Once | INTRAMUSCULAR | Status: AC
  Administered 2017-07-05: 10 mL
  Filled 2017-07-05: qty 10

## 2017-07-05 MED ORDER — CEPHALEXIN 500 MG PO CAPS: 500.0000 mg | ORAL_CAPSULE | Freq: Four times a day (QID) | ORAL | 0 refills | Status: DC

## 2017-07-05 NOTE — ED Triage Notes (Signed)
Pt returned to ED for wound check as recommended this am

## 2017-07-05 NOTE — ED Provider Notes (Signed)
[facility] DEPT Provider Note   CSN: [id number] Arrival date & time: 07/05/17  1839     History   Chief Complaint Chief Complaint  Patient presents with  . Wound Check    HPI Cassandra Wilkinson is a 56 y.o. female with history of hypertension who presents with laceration to right hand.  Patient was evaluated earlier today (around 12 hours ago), but could not stay for laceration repair due to having to get to work.  She reports she slipped and hit her hand on a broken antenna on her car.  Tetanus is up-to-date.  She denies any numbness or tingling.  She has had full range of motion of her hand throughout the day without any problems.  The wound was wrapped with Xeroform and gauze.  She was prescribed Keflex, however she has not had time to fill the prescription today.  HPI  Past Medical History:  Diagnosis Date  . Hypertension     There are no active problems to display for this patient.   Past Surgical History:  Procedure Laterality Date  . APPENDECTOMY    . CESAREAN SECTION    . TUBAL LIGATION      OB History    No data available       Home Medications    Prior to Admission medications   Medication Sig Start Date End Date Taking? Authorizing Provider  amoxicillin-clavulanate (AUGMENTIN) 875-125 MG tablet Take 1 tablet by mouth every 12 (twelve) hours. 04/13/17   Ashley Murrain, NP  Aspirin-Salicylamide-Caffeine (BC HEADACHE POWDER PO) Take 1 Package by mouth every 4 (four) hours as needed (for pain).    [provider]  cephALEXin (KEFLEX) 500 MG capsule Take 1 capsule (500 mg total) by mouth 4 (four) times daily. 07/05/17   Langston Masker B, PA-C  cetirizine (ZYRTEC) 10 MG tablet Take 1 tablet (10 mg total) by mouth daily. Patient not taking: Reported on 11/17/2016 07/25/16   Gloriann Loan, PA-C  erythromycin ophthalmic ointment Place a 1/2 inch ribbon of ointment into the lower eyelid 4 times daily for 5 days. Patient not taking:  Reported on 11/17/2016 07/25/16   Gloriann Loan, PA-C  fluticasone [facility]) 50 MCG/ACT nasal spray Place 1 spray into both nostrils daily. 04/13/17   Ashley Murrain, NP  lisinopril-hydrochlorothiazide (ZESTORETIC) 20-12.5 MG tablet Take 1 tablet by mouth daily. 06/29/17   Orlie Dakin, MD  pseudoephedrine (SUDAFED) 120 MG 12 hr tablet Take 120 mg by mouth 2 (two) times daily as needed for congestion.     [provider]  sodium chloride (OCEAN) 0.65 % SOLN nasal spray Place 1 spray into both nostrils 2 (two) times daily as needed for congestion.    [provider]    Family History Family History  Problem Relation Age of Onset  . Heart failure Mother   . Diabetes Mother   . Cancer Father     Social History Social History   Tobacco Use  . Smoking status: Current Every Day Smoker    Packs/day: 0.50    Types: Cigarettes  . Smokeless tobacco: Never Used  Substance Use Topics  . Alcohol use: No  . Drug use: No     Allergies   Codeine and Food   Review of Systems Review of Systems  Constitutional: Negative for fever.  Skin: Positive for wound.  Neurological: Negative for numbness.     Physical Exam Updated Vital Signs BP (!) 157/91 (BP Location: Left Arm)   Pulse  67   Temp 98.4 F (36.9 C) (Oral)   Resp 18   LMP 06/24/2012   SpO2 100%   Physical Exam  Constitutional: She appears well-developed and well-nourished. No distress.  HENT:  Head: Normocephalic and atraumatic.  Mouth/Throat: Oropharynx is clear and moist. No oropharyngeal exudate.  Eyes: Conjunctivae are normal. Pupils are equal, round, and reactive to light. Right eye exhibits no discharge. Left eye exhibits no discharge. No scleral icterus.  Neck: Normal range of motion. Neck supple. No thyromegaly present.  Cardiovascular: Normal rate, regular rhythm, normal heart sounds and intact distal pulses. Exam reveals no gallop and no friction rub.  No murmur heard. Pulmonary/Chest: Effort normal  and breath sounds normal. No stridor. No respiratory distress. She has no wheezes. She has no rales.  Abdominal: Soft. Bowel sounds are normal. She exhibits no distension. There is no tenderness. There is no rebound and no guarding.  Musculoskeletal: She exhibits no edema.  R hand: ~6 cm laceration in the thenar eminence, about 3-4 cm fairly deep; no tendon visualized, no foreign bodies, bleeding controlled; full range of motion of digits with flexion, extension, abduction, abduction; cap refill less than 2 seconds; normal sensation  Lymphadenopathy:    She has no cervical adenopathy.  Neurological: She is alert. Coordination normal.  Skin: Skin is warm and dry. No rash noted. She is not diaphoretic. No pallor.  Psychiatric: She has a normal mood and affect.  Nursing note and vitals reviewed.    ED Treatments / Results  Labs (all labs ordered are listed, but only abnormal results are displayed) Labs Reviewed - No data to display  EKG  EKG Interpretation None       Radiology No results found.  Procedures .Marland KitchenLaceration Repair Date/Time: 07/05/2017 9:11 PM Performed by: Frederica Kuster, PA-C Authorized by: Frederica Kuster, PA-C   Consent:    Consent obtained:  Verbal   Consent given by:  Patient   Risks discussed:  Infection, poor cosmetic result and pain   Alternatives discussed:  No treatment Anesthesia (see MAR for exact dosages):    Anesthesia method:  Local infiltration   Local anesthetic:  Lidocaine 1% w/o epi Laceration details:    Location:  Hand   Hand location:  R palm   Length (cm):  6   Depth (mm):  4 Repair type:    Repair type:  Simple Pre-procedure details:    Preparation:  Patient was prepped and draped in usual sterile fashion Exploration:    Wound exploration: wound explored through full range of motion     Wound extent: no foreign bodies/material noted, no muscle damage noted, no nerve damage noted and no tendon damage noted     Contaminated: no    Treatment:    Area cleansed with:  Saline   Amount of cleaning:  Standard   Irrigation solution:  Sterile saline   Irrigation volume:  100 mL   Irrigation method:  Syringe   Visualized foreign bodies/material removed: no   Skin repair:    Repair method:  Sutures   Suture size:  4-0   Wound skin closure material used: Ethilon.   Suture technique:  Simple interrupted   Number of sutures:  4 Approximation:    Approximation:  Loose   Vermilion border: well-aligned   Post-procedure details:    Dressing:  Antibiotic ointment, splint for protection and non-adherent dressing   Patient tolerance of procedure:  Tolerated well, no immediate complications   (including critical care time)  Medications Ordered in ED Medications  lidocaine (PF) (XYLOCAINE) 1 % injection 10 mL (10 mLs Infiltration Given 07/05/17 2003)  cephALEXin (KEFLEX) capsule 500 mg (500 mg Oral Given 07/05/17 2137)     Initial Impression / Assessment and Plan / ED Course  I have reviewed the triage vital signs and the nursing notes.  Pertinent labs & imaging results that were available during my care of the patient were reviewed by me and considered in my medical decision making (see chart for details).     Tetanus UTD. Laceration occurred about 12 hours prior to repair.  Discharged home with Keflex earlier today.  Patient encouraged to begin taking tomorrow, will give first dose here in the ED.  Discussed laceration care with pt and answered questions. Pt to f-u for suture removal in 7-10 days and wound check sooner should there be signs of dehiscence or infection.  We will also give thumb spica splint to help protect the sutured wound. Patient understands and agrees with plan.  Pt is hemodynamically stable with no complaints prior to dc.     Final Clinical Impressions(s) / ED Diagnoses   Final diagnoses:  Laceration of right hand without foreign body, subsequent encounter    ED Discharge Orders    None         Caryl Ada 07/05/17 2209    Merrily Pew, MD 07/07/17 [phone]

## 2017-07-05 NOTE — Discharge Instructions (Signed)
Treatment: Keep your wound dry and dressing applied until this time tomorrow. After 24 hours, you may wash with warm soapy water. Dry and apply antibiotic ointment and clean dressing. Do this daily until your sutures are removed. Take Keflex as prescribed until completed.  Follow-up: Please follow-up with your primary care provider or return to emergency department in 7-10 days for suture removal. Be aware of signs of infection: fever, increasing pain, redness, swelling, drainage from the area. Please call your primary care provider or return to emergency department if you develop any of these symptoms or if any of the sutures come out prior to removal. Please return to the emergency department if you develop any other new or worsening symptoms.

## 2017-07-05 NOTE — ED Provider Notes (Signed)
[place] DEPT Provider Note   CSN: [id number] Arrival date & time: 07/05/17  0756     History   Chief Complaint Chief Complaint  Patient presents with  . Extremity Laceration    HPI Dorna Mallet is a 56 y.o. female.  HPI   Patient is a 56 year old female with a history of hypertension presenting for a laceration to her right palm.  Patient reports that this morning on her way to the car she slipped and grabbed the broken antenna on her car which lacerated her hand.  Patient denies any numbness or weakness in the distal right extremity immediately following this incident.  Hemostasis was achieved by compression of the hand per patient.  Patient reports that her tetanus shot was 2 years ago.   Past Medical History:  Diagnosis Date  . Hypertension     There are no active problems to display for this patient.   Past Surgical History:  Procedure Laterality Date  . APPENDECTOMY    . CESAREAN SECTION    . TUBAL LIGATION      OB History    No data available       Home Medications    Prior to Admission medications   Medication Sig Start Date End Date Taking? Authorizing Provider  amoxicillin-clavulanate (AUGMENTIN) 875-125 MG tablet Take 1 tablet by mouth every 12 (twelve) hours. 04/13/17   Ashley Murrain, NP  Aspirin-Salicylamide-Caffeine (BC HEADACHE POWDER PO) Take 1 Package by mouth every 4 (four) hours as needed (for pain).    [provider]  cephALEXin (KEFLEX) 500 MG capsule Take 1 capsule (500 mg total) by mouth 4 (four) times daily. 07/05/17   Langston Masker B, PA-C  cetirizine (ZYRTEC) 10 MG tablet Take 1 tablet (10 mg total) by mouth daily. Patient not taking: Reported on 11/17/2016 07/25/16   Gloriann Loan, PA-C  erythromycin ophthalmic ointment Place a 1/2 inch ribbon of ointment into the lower eyelid 4 times daily for 5 days. Patient not taking: Reported on 11/17/2016 07/25/16   Gloriann Loan, PA-C  fluticasone [facility])  50 MCG/ACT nasal spray Place 1 spray into both nostrils daily. 04/13/17   Ashley Murrain, NP  lisinopril-hydrochlorothiazide (ZESTORETIC) 20-12.5 MG tablet Take 1 tablet by mouth daily. 06/29/17   Orlie Dakin, MD  pseudoephedrine (SUDAFED) 120 MG 12 hr tablet Take 120 mg by mouth 2 (two) times daily as needed for congestion.     [provider]  sodium chloride (OCEAN) 0.65 % SOLN nasal spray Place 1 spray into both nostrils 2 (two) times daily as needed for congestion.    [provider]    Family History Family History  Problem Relation Age of Onset  . Heart failure Mother   . Diabetes Mother   . Cancer Father     Social History Social History   Tobacco Use  . Smoking status: Current Every Day Smoker    Packs/day: 0.50    Types: Cigarettes  . Smokeless tobacco: Never Used  Substance Use Topics  . Alcohol use: No  . Drug use: No     Allergies   Codeine and Food   Review of Systems Review of Systems  Gastrointestinal: Negative for nausea.  Skin: Positive for wound.  Neurological: Negative for weakness and numbness.     Physical Exam Updated Vital Signs BP (!) 188/110 (BP Location: Left Arm)   Pulse 84   Temp 98.1 F (36.7 C) (Oral)   Resp 18   Ht 5'  6" (1.676 m)   Wt 106.1 kg (234 lb)   LMP 06/24/2012   SpO2 100%   BMI 37.77 kg/m   Physical Exam  Constitutional: She appears well-developed and well-nourished. No distress.  Sitting comfortably in bed.  HENT:  Head: Normocephalic and atraumatic.  Eyes: Conjunctivae are normal. Right eye exhibits no discharge. Left eye exhibits no discharge.  EOMs normal to gross examination.  Neck: Normal range of motion.  Cardiovascular: Normal rate and regular rhythm.  Intact, 2+ radial and ulnar pulse of the right upper extremity.  Pulmonary/Chest:  Normal respiratory effort. Patient converses comfortably. No audible wheeze or stridor.  Abdominal: She exhibits no distension.  Musculoskeletal:  Normal range of motion.  Neurological: She is alert.  Cranial nerves intact to gross observation. Patient moves extremities without difficulty. Sensation intact to all digital nerve distributions of the right upper extremity.  Skin: Skin is warm and dry. She is not diaphoretic.  There is approximately 4 cm laceration of the right palm.  This appears to expose the cutaneous tissue but there is no tendon exposure.  Psychiatric: She has a normal mood and affect. Her behavior is normal. Judgment and thought content normal.  Nursing note and vitals reviewed.      ED Treatments / Results  Labs (all labs ordered are listed, but only abnormal results are displayed) Labs Reviewed - No data to display  EKG  EKG Interpretation None       Radiology No results found.  Procedures Procedures (including critical care time)  Medications Ordered in ED Medications - No data to display   Initial Impression / Assessment and Plan / ED Course  I have reviewed the triage vital signs and the nursing notes.  Pertinent labs & imaging results that were available during my care of the patient were reviewed by me and considered in my medical decision making (see chart for details).    Final Clinical Impressions(s) / ED Diagnoses   Final diagnoses:  Laceration of hand without foreign body, unspecified laterality, initial encounter   Patient is nontoxic-appearing and in no acute distress.  Primary closure of the right palm is recommended.  Patient's right upper extremity is neurovascularly intact.  Tetanus shot is up-to-date.  Patient reports that she cannot stay for primary closure of the palm due to fears about losing her job she is late to work.  I discussed with patient the risks of not having this closed within the first 24 hours including infection and loss of function of the palm.  Patient is in full understanding of these risks and reports that she cannot stay.  Patient reports that she will  report later on today for primary closure.  I prescribed 5 days of Keflex as prophylaxis should patient not be able to get primary closure within a 24-hour window.  Patient is in understanding and agrees with plan of care.  ED Discharge Orders        Ordered    cephALEXin (KEFLEX) 500 MG capsule  4 times daily     07/05/17 0846       Albesa Seen, PA-C 07/05/17 1726    Duffy Bruce, MD 07/05/17 2050

## 2017-07-05 NOTE — ED Triage Notes (Signed)
Patient slipped on ice and hit the palm of her hand on car antenna that was broken off.

## 2017-07-05 NOTE — Discharge Instructions (Signed)
Please return later today because this wound will likely require sutures.  I prescribed an antibiotic in case anything were to happen where you are not able to have this sutured within 24 hours.  The risks of not closing the wound or infection of the hand which can spread to the fingers.  Please return immediately if you notice that you have any numbness, swelling, redness, or weakness in your fingers.

## 2017-10-03 ENCOUNTER — Other Ambulatory Visit: Payer: Self-pay | Admitting: Physician Assistant

## 2017-10-03 DIAGNOSIS — Z1231 Encounter for screening mammogram for malignant neoplasm of breast: Secondary | ICD-10-CM

## 2018-04-09 ENCOUNTER — Ambulatory Visit: Payer: Self-pay

## 2018-09-06 ENCOUNTER — Emergency Department (HOSPITAL_COMMUNITY)
Admission: EM | Admit: 2018-09-06 | Discharge: 2018-09-06 | Disposition: A | Discharge status: Home / Self Care | Payer: Self-pay | Attending: Emergency Medicine | Admitting: Emergency Medicine

## 2018-09-06 ENCOUNTER — Other Ambulatory Visit: Payer: Self-pay

## 2018-09-06 ENCOUNTER — Encounter (HOSPITAL_COMMUNITY): Payer: Self-pay

## 2018-09-06 DIAGNOSIS — Z79899 Other long term (current) drug therapy: Secondary | ICD-10-CM | POA: Insufficient documentation

## 2018-09-06 DIAGNOSIS — R0981 Nasal congestion: Secondary | ICD-10-CM | POA: Insufficient documentation

## 2018-09-06 DIAGNOSIS — F17210 Nicotine dependence, cigarettes, uncomplicated: Secondary | ICD-10-CM | POA: Insufficient documentation

## 2018-09-06 DIAGNOSIS — H1031 Unspecified acute conjunctivitis, right eye: Secondary | ICD-10-CM | POA: Insufficient documentation

## 2018-09-06 DIAGNOSIS — I10 Essential (primary) hypertension: Secondary | ICD-10-CM | POA: Insufficient documentation

## 2018-09-06 MED ORDER — FLUTICASONE PROPIONATE 50 MCG/ACT NA SUSP: 1.0000 | Freq: Every day | NASAL | 2 refills | Status: DC

## 2018-09-06 MED ORDER — SULFACETAMIDE SODIUM 10 % OP SOLN: 1.0000 [drp] | Freq: Four times a day (QID) | OPHTHALMIC | 0 refills | Status: AC

## 2018-09-06 NOTE — ED Triage Notes (Signed)
Patient c/o right eye pain and woke up with eye drainage. 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  patient also co nasal congestion that is brown in color and generalized body aches x 1 week.

## 2018-09-06 NOTE — Discharge Instructions (Signed)
Use the eyedrops as directed for 7 days. Use Flonase as needed for congestion. Return to the ED for worsening symptoms, chest pain, shortness of breath, vomiting or coughing up blood, lightheadedness.

## 2018-09-06 NOTE — ED Provider Notes (Signed)
[place] DEPT Provider Note   CSN: [id number] Arrival date & time: 09/06/18  0820     History   Chief Complaint Chief Complaint  Patient presents with  . Eye Pain  . Generalized Body Aches  . Nasal Congestion    HPI Cassandra Wilkinson is a 58 y.o. female with a past medical history of hypertension who presents to ED for 2-day history of right eye redness, clear and purulent drainage.  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  She states she woke up 2 days ago with her eyelids matted together on the right side.  She has not tried any medications to help with her symptoms.  She denies any foreign body sensation, trauma to the area, pain with EOMs, fever or swelling of her eyelids, vision changes. Her next complaint is 1 week history of sinus pain and pressure.  She reports having yellow, thick mucus when she tries to blow her nose.  She reports mild cough at night and feels drainage down the back of her throat.  She has not tried any medications to help with her symptoms.  Reports similar symptoms around this time of year.  Denies any fever, cough, sore throat.  She again states that there were children that were similarly sick at her job.  HPI  Past Medical History:  Diagnosis Date  . Hypertension     There are no active problems to display for this patient.   Past Surgical History:  Procedure Laterality Date  . APPENDECTOMY    . CESAREAN SECTION    . TUBAL LIGATION       OB History   No obstetric history on file.      Home Medications    Prior to Admission medications   Medication Sig Start Date End Date Taking? Authorizing Provider  amoxicillin-clavulanate (AUGMENTIN) 875-125 MG tablet Take 1 tablet by mouth every 12 (twelve) hours. 04/13/17   Ashley Murrain, NP  Aspirin-Salicylamide-Caffeine (BC HEADACHE POWDER PO) Take 1 Package by mouth every 4 (four) hours  as needed (for pain).    [provider]  cephALEXin (KEFLEX) 500 MG capsule Take 1 capsule (500 mg total) by mouth 4 (four) times daily. 07/05/17   Langston Masker B, PA-C  cetirizine (ZYRTEC) 10 MG tablet Take 1 tablet (10 mg total) by mouth daily. Patient not taking: Reported on 11/17/2016 07/25/16   Gloriann Loan, PA-C  erythromycin ophthalmic ointment Place a 1/2 inch ribbon of ointment into the lower eyelid 4 times daily for 5 days. Patient not taking: Reported on 11/17/2016 07/25/16   Gloriann Loan, PA-C  fluticasone [facility]) 50 MCG/ACT nasal spray Place 1 spray into both nostrils daily. 09/06/18   Corbin Hott, PA-C  lisinopril-hydrochlorothiazide (ZESTORETIC) 20-12.5 MG tablet Take 1 tablet by mouth daily. 06/29/17   Orlie Dakin, MD  pseudoephedrine (SUDAFED) 120 MG 12 hr tablet Take 120 mg by mouth 2 (two) times daily as needed for congestion.     [provider]  sodium chloride (OCEAN) 0.65 % SOLN nasal spray Place 1 spray into both nostrils 2 (two) times daily as needed for congestion.    [provider]  sulfacetamide (BLEPH-10) 10 % ophthalmic solution Place 1-2 drops into the right eye every 6 (six) hours for 7 days. 09/06/18 09/13/18  Delia Heady, PA-C    Family History Family History  Problem Relation Age of Onset  .  Heart failure Mother   . Diabetes Mother   . Cancer Father     Social History Social History   Tobacco Use  . Smoking status: Current Every Day Smoker    Packs/day: 0.15    Types: Cigarettes  . Smokeless tobacco: Never Used  Substance Use Topics  . Alcohol use: No  . Drug use: No     Allergies   Codeine and Food   Review of Systems Review of Systems  Constitutional: Negative for chills and fever.  HENT: Positive for congestion, sinus pressure and sinus pain. Negative for rhinorrhea and trouble swallowing.   Eyes: Positive for discharge, redness and itching. Negative for photophobia, pain and visual disturbance.  Respiratory:  Positive for cough.   Cardiovascular: Negative for chest pain.     Physical Exam Updated Vital Signs BP (!) 187/84 (BP Location: Left Arm)   Pulse 73   Temp 97.6 F (36.4 C) (Oral)   Resp 18   Ht 5\' 7"  (1.702 m)   Wt 108 kg   LMP 06/24/2012   SpO2 100%   BMI 37.28 kg/m   Physical Exam Vitals signs and nursing note reviewed.  Constitutional:      General: She is not in acute distress.    Appearance: She is well-developed. She is not diaphoretic.  HENT:     Head: Normocephalic and atraumatic.     Nose: Congestion present.     Right Sinus: Maxillary sinus tenderness present.     Left Sinus: Maxillary sinus tenderness present.  Eyes:     General: No scleral icterus.       Right eye: Discharge present.     Extraocular Movements:     Right eye: Normal extraocular motion.     Conjunctiva/sclera:     Right eye: Right conjunctiva is injected.     Pupils: Pupils are equal, round, and reactive to light.     Comments: Right eye with injected conjunctiva, no eyelid swelling or erythema or tenderness to palpation.  Mild clear tearful drainage noted.  No foreign bodies noted.  No pain with EOMs.  No chemosis, proptosis, or consensual photophobia.  Neck:     Musculoskeletal: Normal range of motion.  Cardiovascular:     Rate and Rhythm: Normal rate and regular rhythm.     Heart sounds: Normal heart sounds.  Pulmonary:     Effort: Pulmonary effort is normal. No respiratory distress.     Breath sounds: Normal breath sounds.  Skin:    Findings: No rash.  Neurological:     Mental Status: She is alert.      ED Treatments / Results  Labs (all labs ordered are listed, but only abnormal results are displayed) Labs Reviewed - No data to display  EKG None  Radiology No results found.  Procedures Procedures (including critical care time)  Medications Ordered in ED Medications - No data to display   Initial Impression / Assessment and Plan / ED Course  I have reviewed the  triage vital signs and the nursing notes.  Pertinent labs & imaging results that were available during my care of the patient were reviewed by me and considered in my medical decision making (see chart for details).     58yo F presents to ED for evaluation of 2 day history of R eye redness, clear and purulent drainage. She also reports 1 week history of nasal congestion, sinus pain and pressure. 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 She  has not tried any medications to help with her symptoms. She notes mild cough at bedtime when she feels drainage down her throat. On my exam lungs are CTAB. She has maxillary sinus TTP. Right eye shows conjunctival injection with tearful drainage noted. No pain with EOMS, eyelid swelling, proptosis or vision changes noted. Suspect that her symptoms are due to conjunctivitis based on her sick contacts. Doubt iritis, keratitis, corneal abrasion or other emergent cause of her symptoms as she is denying vision changes and no trauma or FB sensation noted. She is is not in the 10-day window for treatment for acute bacterial sinusitis.  Will treat with steroid nasal spray, sulfacetamide drops and PCP follow-up.  Patient is hemodynamically stable, in NAD, and able to ambulate in the ED. Evaluation does not show pathology that would require ongoing emergent intervention or inpatient treatment. I explained the diagnosis to the patient. Pain has been managed and has no complaints prior to discharge. Patient is comfortable with above plan and is stable for discharge at this time. All questions were answered prior to disposition. Strict return precautions for returning to the ED were discussed. Encouraged follow up with PCP.    Portions of this note were generated with [employer]. Dictation errors may occur despite best attempts at proofreading.   Final Clinical Impressions(s) / ED Diagnoses   Final diagnoses:  Acute conjunctivitis  of right eye, unspecified acute conjunctivitis type  Nasal congestion    ED Discharge Orders         Ordered    sulfacetamide (BLEPH-10) 10 % ophthalmic solution  Every 6 hours     09/06/18 0847    fluticasone (FLONASE) 50 MCG/ACT nasal spray  Daily     09/06/18 0847           Delia Heady, PA-C 09/06/18 0929    Blanchie Dessert, MD 09/06/18 1535

## 2019-02-23 ENCOUNTER — Other Ambulatory Visit: Payer: Self-pay | Admitting: Physician Assistant

## 2019-02-23 DIAGNOSIS — Z1231 Encounter for screening mammogram for malignant neoplasm of breast: Secondary | ICD-10-CM

## 2019-04-10 ENCOUNTER — Ambulatory Visit: Payer: Self-pay

## 2020-05-14 ENCOUNTER — Emergency Department (HOSPITAL_COMMUNITY)
Admission: EM | Admit: 2020-05-14 | Discharge: 2020-05-14 | Disposition: A | Discharge status: Home / Self Care | Payer: Medicaid Other | Attending: Emergency Medicine | Admitting: Emergency Medicine

## 2020-05-14 ENCOUNTER — Emergency Department (HOSPITAL_COMMUNITY): Payer: Medicaid Other

## 2020-05-14 ENCOUNTER — Other Ambulatory Visit: Payer: Self-pay

## 2020-05-14 ENCOUNTER — Encounter (HOSPITAL_COMMUNITY): Payer: Self-pay

## 2020-05-14 DIAGNOSIS — Z79899 Other long term (current) drug therapy: Secondary | ICD-10-CM | POA: Insufficient documentation

## 2020-05-14 DIAGNOSIS — I10 Essential (primary) hypertension: Secondary | ICD-10-CM | POA: Insufficient documentation

## 2020-05-14 DIAGNOSIS — Z7982 Long term (current) use of aspirin: Secondary | ICD-10-CM | POA: Diagnosis not present

## 2020-05-14 DIAGNOSIS — J0100 Acute maxillary sinusitis, unspecified: Secondary | ICD-10-CM | POA: Insufficient documentation

## 2020-05-14 DIAGNOSIS — F17210 Nicotine dependence, cigarettes, uncomplicated: Secondary | ICD-10-CM | POA: Diagnosis not present

## 2020-05-14 DIAGNOSIS — Z20822 Contact with and (suspected) exposure to covid-19: Secondary | ICD-10-CM | POA: Diagnosis not present

## 2020-05-14 DIAGNOSIS — H538 Other visual disturbances: Secondary | ICD-10-CM | POA: Diagnosis not present

## 2020-05-14 DIAGNOSIS — R519 Headache, unspecified: Secondary | ICD-10-CM | POA: Diagnosis present

## 2020-05-14 LAB — RESPIRATORY PANEL BY RT PCR (FLU A&B, COVID)
Influenza A by PCR: NEGATIVE
Influenza B by PCR: NEGATIVE
SARS Coronavirus 2 by RT PCR: NEGATIVE

## 2020-05-14 IMAGING — CT CT HEAD W/O CM
3 series · 14 of 47 positions shown, 16 images · non-contrast
Comparison: None

CLINICAL DATA: Headache, new or worsening headache for 4 days

EXAM:
CT HEAD WITHOUT CONTRAST
TECHNIQUE: Contiguous axial images were obtained from the base of the skull
through the vertex without intravenous contrast.

[Series 2: head wo · axial · 0.47mm/px · z∈[+1491,+1616]mm · 8 of 31 slices shown, 10 images]
[im 3/31  brain]
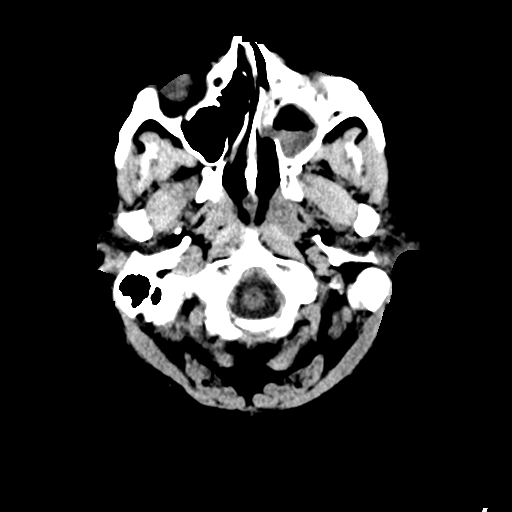
[im 3/31  bone]
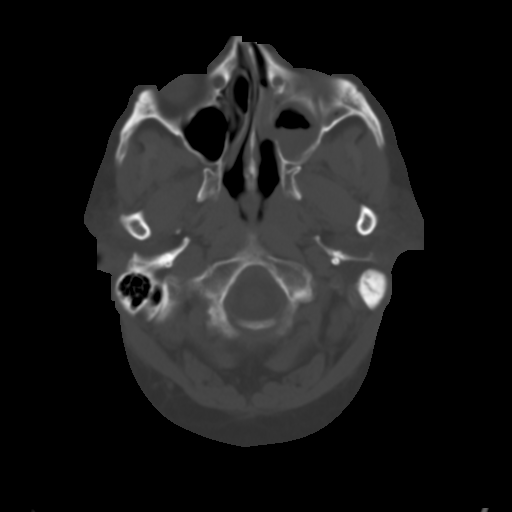
[im 7/31  brain]
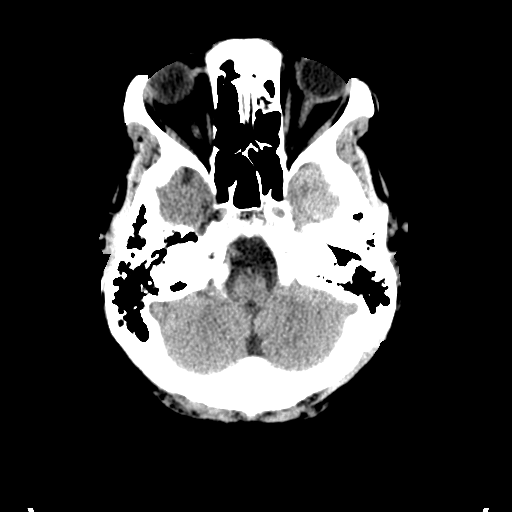
[im 10/31  brain]
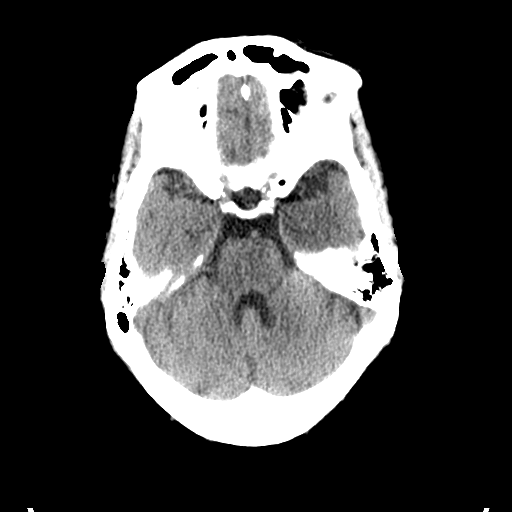
[im 14/31  brain]
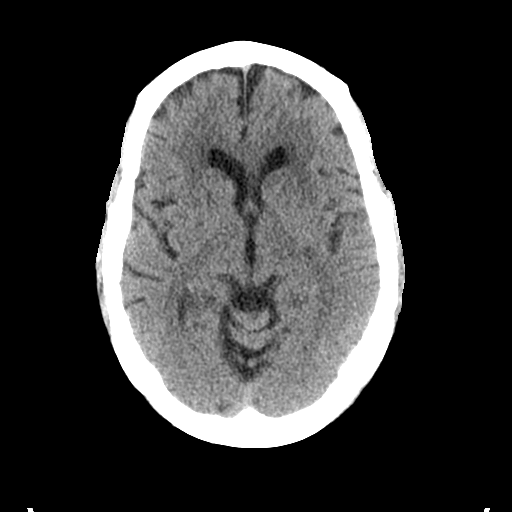
[im 17/31  brain]
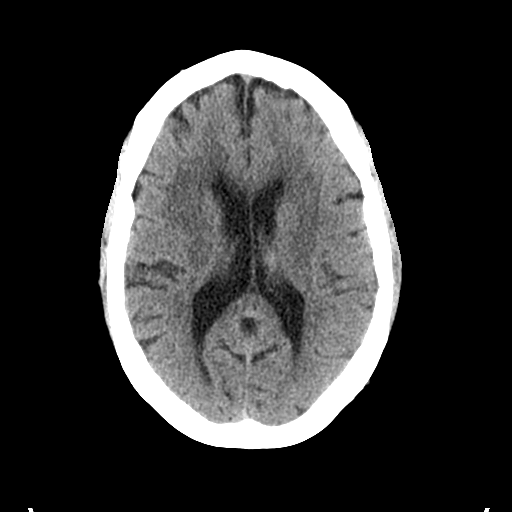
[im 17/31  bone]
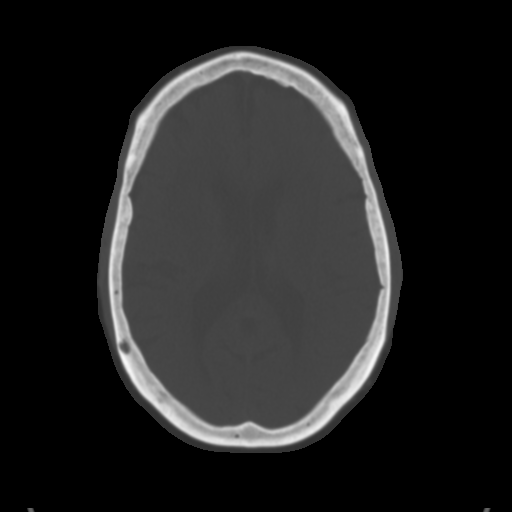
[im 21/31  brain]
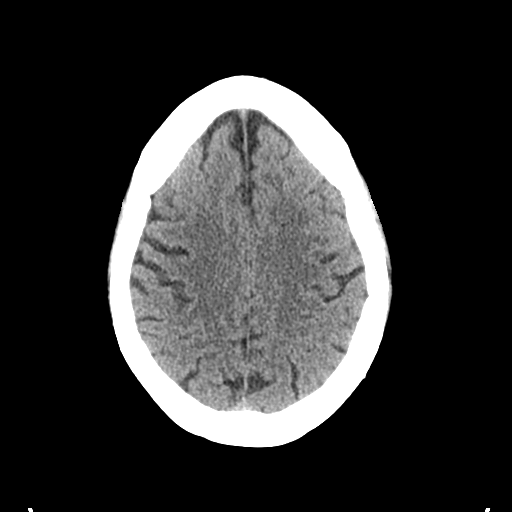
[im 24/31  brain]
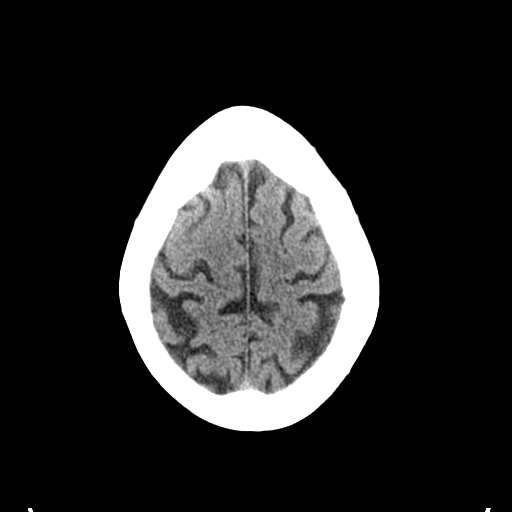
[im 28/31  brain]
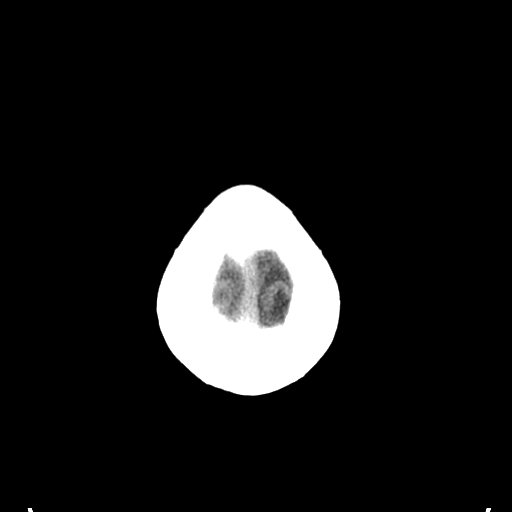

[Series 5: coronal soft tissue · coronal · 0.30mm/px · 3 of 78 slices shown]
[im 26/78  brain]
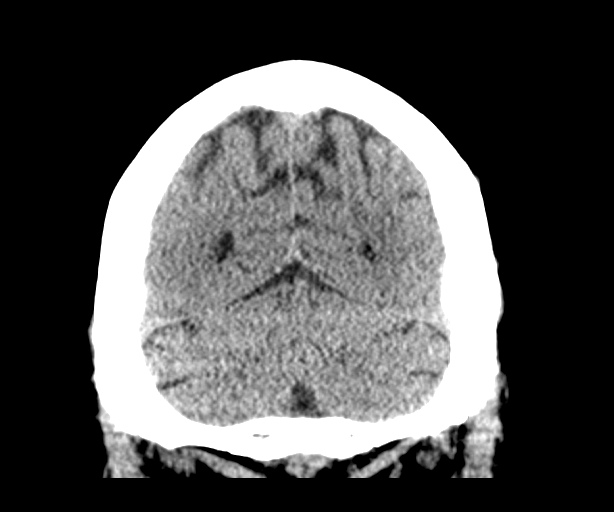
[im 35/78  brain]
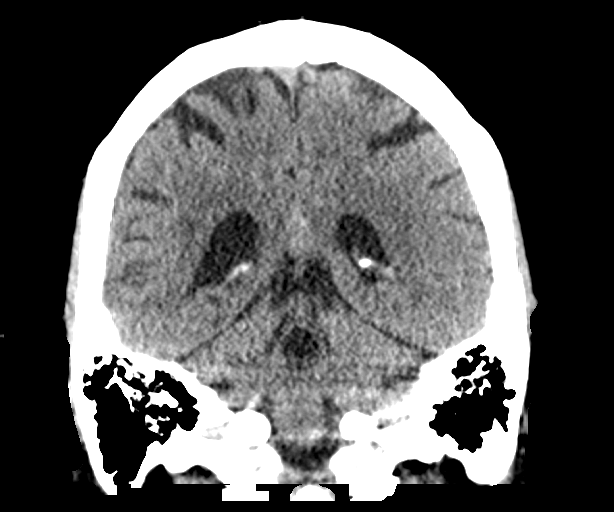
[im 43/78  brain]
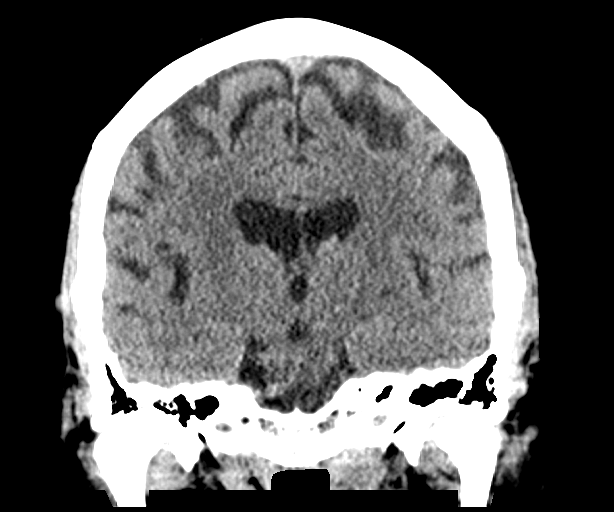

[Series 6: sagittal soft tissue · sagittal · 0.30mm/px · 3 of 63 slices shown]
[im 21/63  brain]
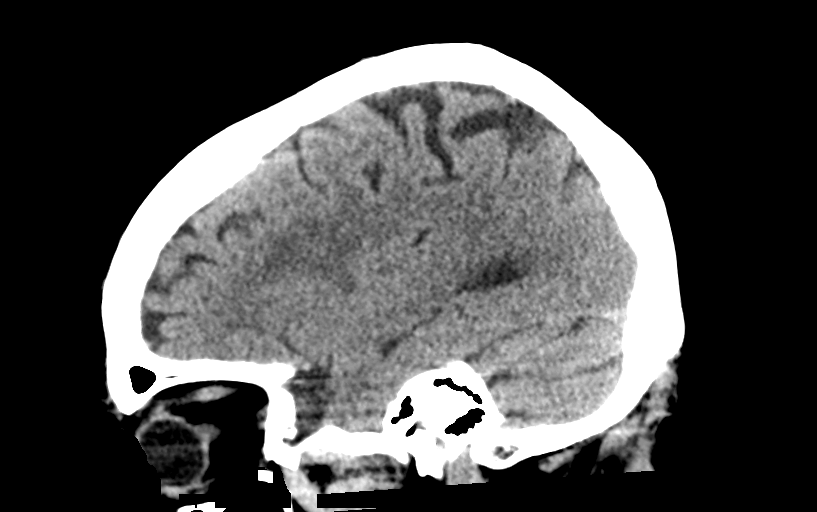
[im 32/63  brain]
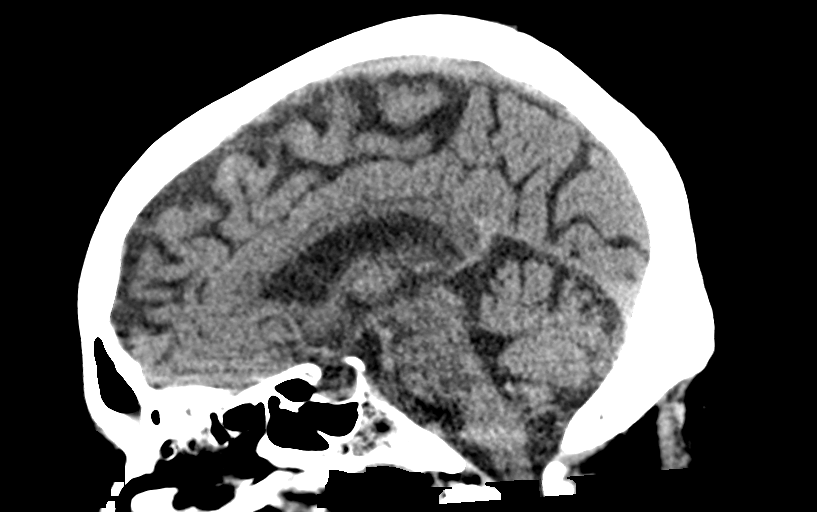
[im 42/63  brain]
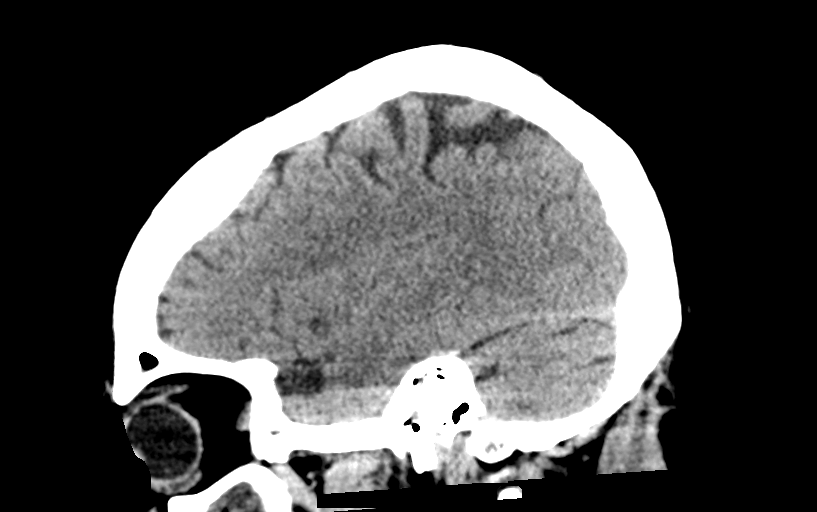

[14 of 47 positions shown; findings below may reference images not displayed]

FINDINGS: Brain: No evidence of acute infarction, hemorrhage, hydrocephalus,
extra-axial collection or mass lesion/mass effect.

Vascular: No hyperdense vessel or unexpected calcification.

Skull: Normal. Negative for fracture or focal lesion.

Sinuses/Orbits: Marked opacification of the LEFT maxillary sinus
with small air-fluid level. Opacification of LEFT ethmoid sinuses is
scattered. Sphenoid sinuses are clear. Mastoid air cells are clear.
Orbits are unremarkable.

Other: None
IMPRESSION: 1. No acute intracranial abnormality.
2. Left maxillary and ethmoid sinus disease likely acute on chronic.

## 2020-05-14 MED ORDER — ACETAMINOPHEN 325 MG PO TABS
650.0000 mg | ORAL_TABLET | Freq: Once | ORAL | Status: AC
  Administered 2020-05-14: 650 mg via ORAL
  Filled 2020-05-14: qty 2

## 2020-05-14 MED ORDER — FLUTICASONE PROPIONATE 50 MCG/ACT NA SUSP: 2.0000 | Freq: Every day | NASAL | 0 refills | Status: DC

## 2020-05-14 MED ORDER — AMOXICILLIN-POT CLAVULANATE 875-125 MG PO TABS
1.0000 | ORAL_TABLET | Freq: Once | ORAL | Status: AC
  Administered 2020-05-14: 1 via ORAL
  Filled 2020-05-14: qty 1

## 2020-05-14 MED ORDER — AMOXICILLIN-POT CLAVULANATE 875-125 MG PO TABS: 1.0000 | ORAL_TABLET | Freq: Two times a day (BID) | ORAL | 0 refills | Status: AC

## 2020-05-14 NOTE — ED Triage Notes (Signed)
Pt reports "massive headache never had before" around eyes especially on left. Pt reports blurry vision first 2 days relieved by rest and darkness. Runny nose, slight cough, occasional chill. Denies fever and vomiting. Symptoms started 4 days ago. HA 8/10. Took BC yesterday with no relief. Denies blood thinners.

## 2020-05-14 NOTE — Discharge Instructions (Addendum)
At this time there does not appear to be the presence of an emergent medical condition, however there is always the potential for conditions to change. Please read and follow the below instructions.  Please return to the Emergency Department immediately for any new or worsening symptoms. Please be sure to follow up with your Primary Care Provider within one week regarding your visit today; please call their office to schedule an appointment even if you are feeling better for a follow-up visit. Please use Flonase as prescribed to help with your sinus infection. Please take your antibiotic Augmentin as prescribed until complete to help with your symptoms.  Please drink enough water to avoid dehydration and get plenty of rest. Additionally your blood pressure was elevated in the emergency department today, please have it rechecked by your primary care doctor within 1 week and discuss medication management at that time if indicated.  Go to the nearest Emergency Department immediately if: You have fever or chills You have a very bad headache. You cannot stop throwing up (vomiting). You have very bad pain or swelling around your face or eyes. You have trouble seeing. You feel confused. Your neck is stiff. You have trouble breathing. You have any new/concerning or worsening of symptoms   Please read the additional information packets attached to your discharge summary.  Do not take your medicine if  develop an itchy rash, swelling in your mouth or lips, or difficulty breathing; call 911 and seek immediate emergency medical attention if this occurs.  You may review your lab tests and imaging results in their entirety on your MyChart account.  Please discuss all results of fully with your primary care provider and other specialist at your follow-up visit.  Note: Portions of this text may have been transcribed using voice recognition software. Every effort was made to ensure accuracy; however,  inadvertent computerized transcription errors may still be present.

## 2020-05-14 NOTE — ED Provider Notes (Addendum)
[facility] DEPT Provider Note   CSN: [id number] Arrival date & time: 05/14/20  0715     History Chief Complaint  Patient presents with  . Headache    Cassandra Wilkinson is a 59 y.o. female history of hypertension, obesity otherwise healthy.  Patient presents today for headache onset 4 days ago gradual onset worsening primarily to the face and forehead severe constant nonradiating no aggravating factors, temporarily improved with BC powder last taken yesterday, denies similar headache in the past.  Associated symptoms of rhinorrhea.  Also reports mild bilateral blurred vision left worse than right 2 days ago which completely resolved.  Denies fever/chills, confusion, speech difficulty, neck stiffness, sore throat, chest pain/shortness of breath, cough, abdominal pain, vomiting, diarrhea, numbness/weakness, tingling, balance issues or any additional concerns.  Of note patient reports she has had to COVID-19 vaccinations in March 2021.  HPI     Past Medical History:  Diagnosis Date  . Hypertension     There are no problems to display for this patient.   Past Surgical History:  Procedure Laterality Date  . APPENDECTOMY    . CESAREAN SECTION    . TUBAL LIGATION       OB History   No obstetric history on file.     Family History  Problem Relation Age of Onset  . Heart failure Mother   . Diabetes Mother   . Cancer Father     Social History   Tobacco Use  . Smoking status: Current Every Day Smoker    Packs/day: 0.15    Types: Cigarettes  . Smokeless tobacco: Never Used  Vaping Use  . Vaping Use: Never used  Substance Use Topics  . Alcohol use: No  . Drug use: No    Home Medications Prior to Admission medications   Medication Sig Start Date End Date Taking? Authorizing Provider  amoxicillin-clavulanate (AUGMENTIN) 875-125 MG tablet Take 1 tablet by mouth every 12 (twelve) hours for 7 days. 05/14/20 05/21/20  Deliah Boston,  PA-C  Aspirin-Salicylamide-Caffeine (BC HEADACHE POWDER PO) Take 1 Package by mouth every 4 (four) hours as needed (for pain).    [provider]  cetirizine (ZYRTEC) 10 MG tablet Take 1 tablet (10 mg total) by mouth daily. Patient not taking: Reported on 11/17/2016 07/25/16   Gloriann Loan, PA-C  erythromycin ophthalmic ointment Place a 1/2 inch ribbon of ointment into the lower eyelid 4 times daily for 5 days. Patient not taking: Reported on 11/17/2016 07/25/16   Gloriann Loan, PA-C  fluticasone [facility]) 50 MCG/ACT nasal spray Place 2 sprays into both nostrils daily. 05/14/20   Deliah Boston, PA-C  lisinopril-hydrochlorothiazide (ZESTORETIC) 20-12.5 MG tablet Take 1 tablet by mouth daily. 06/29/17   Orlie Dakin, MD  pseudoephedrine (SUDAFED) 120 MG 12 hr tablet Take 120 mg by mouth 2 (two) times daily as needed for congestion.     [provider]  sodium chloride (OCEAN) 0.65 % SOLN nasal spray Place 1 spray into both nostrils 2 (two) times daily as needed for congestion.    [provider]    Allergies    Codeine and Food  Review of Systems   Review of Systems Ten systems are reviewed and are negative for acute change except as noted in the HPI  Physical Exam Updated Vital Signs BP 115/73   Pulse 77   Temp 97.9 F (36.6 C) (Oral)   Resp 18   Ht 5\' 7"  (1.702 m)   Wt 108.9 kg  LMP 06/24/2012   SpO2 100%   BMI 37.59 kg/m   Physical Exam Constitutional:      General: She is not in acute distress.    Appearance: Normal appearance. She is well-developed. She is not ill-appearing or diaphoretic.  HENT:     Head: Normocephalic and atraumatic.     Jaw: There is normal jaw occlusion. No trismus.     Right Ear: Tympanic membrane and external ear normal.     Left Ear: Tympanic membrane and external ear normal.     Nose: Rhinorrhea present. Rhinorrhea is clear.     Right Turbinates: Swollen.     Left Turbinates: Swollen.     Right Sinus: Maxillary sinus  tenderness present. No frontal sinus tenderness.     Left Sinus: Maxillary sinus tenderness and frontal sinus tenderness present.     Mouth/Throat:     Mouth: Mucous membranes are moist.     Pharynx: Oropharynx is clear.     Comments: The patient has normal phonation and is in control of secretions. No stridor.  Midline uvula without edema. Soft palate rises symmetrically. No tonsillar erythema, swelling or exudates. Tongue protrusion is normal, floor of mouth is soft. No trismus. No creptius on neck palpation. No gingival erythema or fluctuance noted. Mucus membranes moist. Eyes:     General: Vision grossly intact. Gaze aligned appropriately.     Pupils: Pupils are equal, round, and reactive to light.  Neck:     Trachea: Trachea and phonation normal.     Meningeal: Brudzinski's sign absent.  Pulmonary:     Effort: Pulmonary effort is normal. No respiratory distress.  Abdominal:     General: There is no distension.     Palpations: Abdomen is soft.     Tenderness: There is no abdominal tenderness. There is no guarding or rebound.  Musculoskeletal:        General: Normal range of motion.     Cervical back: Normal range of motion and neck supple.  Skin:    General: Skin is warm and dry.  Neurological:     Mental Status: She is alert.     GCS: GCS eye subscore is 4. GCS verbal subscore is 5. GCS motor subscore is 6.     Comments: Speech is clear and goal oriented, follows commands Major Cranial nerves without deficit, no facial droop Normal strength in upper and lower extremities bilaterally including dorsiflexion and plantar flexion, strong and equal grip strength Sensation normal to light and sharp touch Moves extremities without ataxia, coordination intact Normal finger to nose and rapid alternating movements Neg romberg, no pronator drift Normal gait Normal heel-shin and balance  Psychiatric:        Behavior: Behavior normal.    ED Results / Procedures / Treatments    Labs (all labs ordered are listed, but only abnormal results are displayed) Labs Reviewed  RESPIRATORY PANEL BY RT PCR (FLU A&B, COVID)    EKG None  Radiology CT Head Wo Contrast  Result Date: 05/14/2020 CLINICAL DATA:  Headache, new or worsening headache for 4 days EXAM: CT HEAD WITHOUT CONTRAST TECHNIQUE: Contiguous axial images were obtained from the base of the skull through the vertex without intravenous contrast. COMPARISON:  None FINDINGS: Brain: No evidence of acute infarction, hemorrhage, hydrocephalus, extra-axial collection or mass lesion/mass effect. Vascular: No hyperdense vessel or unexpected calcification. Skull: Normal. Negative for fracture or focal lesion. Sinuses/Orbits: Marked opacification of the LEFT maxillary sinus with small air-fluid level. Opacification of LEFT ethmoid  sinuses is scattered. Sphenoid sinuses are clear. Mastoid air cells are clear. Orbits are unremarkable. Other: None IMPRESSION: 1. No acute intracranial abnormality. 2. Left maxillary and ethmoid sinus disease likely acute on chronic. Electronically Signed   By: Zetta Bills M.D.   On: 05/14/2020 09:08    Procedures Procedures (including critical care time)  Medications Ordered in ED Medications  amoxicillin-clavulanate (AUGMENTIN) 875-125 MG per tablet 1 tablet (has no administration in time range)  acetaminophen (TYLENOL) tablet 650 mg (650 mg Oral Given 05/14/20 2694)    ED Course  I have reviewed the triage vital signs and the nursing notes.  Pertinent labs & imaging results that were available during my care of the patient were reviewed by me and considered in my medical decision making (see chart for details).    MDM Rules/Calculators/A&P                         Additional history obtained from: 1. Nursing notes from this visit. 2. Review of electronic medical records. ------------------------------------------ Covid/influenza panel negative. CT Head:  IMPRESSION:  1. No acute  intracranial abnormality.  2. Left maxillary and ethmoid sinus disease likely acute on chronic.   59 year old female presented with a new headache onset 4 days ago this was gradual in onset.  No thunderclap headache.  She has a reassuring neurologic exam, no meningeal signs, airway clear without evidence of PTA, RPA, Ludwig's or other deep space infections.  She does have evidence of sinusitis on examination particularly of the maxillary sinuses and rhinorrhea for the past 4 days.  Given patient reports this is a new headache for her and she is over 33 years old CT head was obtained showed maxillary and ethmoid sinus disease on the left which likely accounts for her symptoms today.  Covid/influenza panel negative.  Suspect sinusitis as etiology of patient's symptoms today.  Low suspicion for glaucoma, dissection, encephalitis, meningitis, pseudotumor, SAH, arteritis or other life-threatening etiologies at this time. - 9:45 AM: Patient reevaluated she is resting comfortably in bed using her phone no acute distress.  She reports resolution of headache following Tylenol.  She is concerned about her Covid test results, when informed they were negative she was very happy and requested discharge with a work note.  On reexamination no neuro deficits she is well-appearing, vital signs stable.  Plan of care is treatment with Augmentin and Flonase and PCP follow-up.  At this time there does not appear to be any evidence of an acute emergency medical condition and the patient appears stable for discharge with appropriate outpatient follow up. Diagnosis was discussed with patient who verbalizes understanding of care plan and is agreeable to discharge. I have discussed return precautions with patient who verbalizes understanding. Patient encouraged to follow-up with their PCP. All questions answered.  Patient's case discussed with Dr. Eulis Foster who agrees with plan to discharge with Augmentin/Flonase and follow-up.    Note: Portions of this report may have been transcribed using voice recognition software. Every effort was made to ensure accuracy; however, inadvertent computerized transcription errors may still be present. Final Clinical Impression(s) / ED Diagnoses Final diagnoses:  Acute non-recurrent maxillary sinusitis    Rx / DC Orders ED Discharge Orders         Ordered    amoxicillin-clavulanate (AUGMENTIN) 875-125 MG tablet  Every 12 hours        05/14/20 0958    fluticasone (FLONASE) 50 MCG/ACT nasal spray  Daily  05/14/20 0958           Deliah Boston, PA-C 05/14/20 0958    Deliah Boston, PA-C 05/14/20 6825    Daleen Bo, MD 05/15/20 [id number]

## 2021-03-05 ENCOUNTER — Emergency Department (HOSPITAL_COMMUNITY): Payer: Medicaid Other

## 2021-03-05 ENCOUNTER — Emergency Department (HOSPITAL_COMMUNITY)
Admission: EM | Admit: 2021-03-05 | Discharge: 2021-03-05 | Disposition: A | Discharge status: Home / Self Care | Payer: Medicaid Other | Attending: Emergency Medicine | Admitting: Emergency Medicine

## 2021-03-05 ENCOUNTER — Encounter (HOSPITAL_COMMUNITY): Payer: Self-pay

## 2021-03-05 DIAGNOSIS — F17210 Nicotine dependence, cigarettes, uncomplicated: Secondary | ICD-10-CM | POA: Insufficient documentation

## 2021-03-05 DIAGNOSIS — Z20822 Contact with and (suspected) exposure to covid-19: Secondary | ICD-10-CM | POA: Diagnosis not present

## 2021-03-05 DIAGNOSIS — E860 Dehydration: Secondary | ICD-10-CM | POA: Insufficient documentation

## 2021-03-05 DIAGNOSIS — Z79899 Other long term (current) drug therapy: Secondary | ICD-10-CM | POA: Diagnosis not present

## 2021-03-05 DIAGNOSIS — I421 Obstructive hypertrophic cardiomyopathy: Secondary | ICD-10-CM | POA: Diagnosis not present

## 2021-03-05 DIAGNOSIS — I10 Essential (primary) hypertension: Secondary | ICD-10-CM | POA: Insufficient documentation

## 2021-03-05 DIAGNOSIS — N179 Acute kidney failure, unspecified: Secondary | ICD-10-CM | POA: Diagnosis not present

## 2021-03-05 DIAGNOSIS — R55 Syncope and collapse: Secondary | ICD-10-CM

## 2021-03-05 LAB — CBC WITH DIFFERENTIAL/PLATELET
Abs Immature Granulocytes: 0.04 10*3/uL (ref 0.00–0.07)
Basophils Absolute: 0 10*3/uL (ref 0.0–0.1)
Basophils Relative: 0 %
Eosinophils Absolute: 0 10*3/uL (ref 0.0–0.5)
Eosinophils Relative: 0 %
HCT: 40.9 % (ref 36.0–46.0)
Hemoglobin: 13.5 g/dL (ref 12.0–15.0)
Immature Granulocytes: 0 %
Lymphocytes Relative: 9 %
Lymphs Abs: 0.9 10*3/uL (ref 0.7–4.0)
MCH: 31.8 pg (ref 26.0–34.0)
MCHC: 33 g/dL (ref 30.0–36.0)
MCV: 96.5 fL (ref 80.0–100.0)
Monocytes Absolute: 0.9 10*3/uL (ref 0.1–1.0)
Monocytes Relative: 9 %
Neutro Abs: 8 10*3/uL — ABNORMAL HIGH (ref 1.7–7.7)
Neutrophils Relative %: 82 %
Platelets: 290 10*3/uL (ref 150–400)
RBC: 4.24 MIL/uL (ref 3.87–5.11)
RDW: 14.6 % (ref 11.5–15.5)
WBC: 9.8 10*3/uL (ref 4.0–10.5)
nRBC: 0 % (ref 0.0–0.2)

## 2021-03-05 LAB — COMPREHENSIVE METABOLIC PANEL
ALT: 15 U/L (ref 0–44)
AST: 25 U/L (ref 15–41)
Albumin: 3.8 g/dL (ref 3.5–5.0)
Alkaline Phosphatase: 73 U/L (ref 38–126)
Anion gap: 11 (ref 5–15)
BUN: 21 mg/dL — ABNORMAL HIGH (ref 6–20)
CO2: 27 mmol/L (ref 22–32)
Calcium: 9.1 mg/dL (ref 8.9–10.3)
Chloride: 97 mmol/L — ABNORMAL LOW (ref 98–111)
Creatinine, Ser: 1.2 mg/dL — ABNORMAL HIGH (ref 0.44–1.00)
GFR, Estimated: 52 mL/min — ABNORMAL LOW (ref >60)
Glucose, Bld: 107 mg/dL — ABNORMAL HIGH (ref 70–99)
Potassium: 3.2 mmol/L — ABNORMAL LOW (ref 3.5–5.1)
Sodium: 135 mmol/L (ref 135–145)
Total Bilirubin: 1.2 mg/dL (ref 0.3–1.2)
Total Protein: 8 g/dL (ref 6.5–8.1)

## 2021-03-05 LAB — TROPONIN I (HIGH SENSITIVITY): Troponin I (High Sensitivity): 9 ng/L (ref <18)

## 2021-03-05 LAB — RESP PANEL BY RT-PCR (FLU A&B, COVID) ARPGX2
Influenza A by PCR: NEGATIVE
Influenza B by PCR: NEGATIVE
SARS Coronavirus 2 by RT PCR: NEGATIVE

## 2021-03-05 LAB — BRAIN NATRIURETIC PEPTIDE: B Natriuretic Peptide: 19.5 pg/mL (ref 0.0–100.0)

## 2021-03-05 LAB — CBG MONITORING, ED: Glucose-Capillary: 89 mg/dL (ref 70–99)

## 2021-03-05 LAB — MAGNESIUM: Magnesium: 2 mg/dL (ref 1.7–2.4)

## 2021-03-05 IMAGING — CT CT HEAD W/O CM
3 series · 14 of 47 positions shown, 16 images · non-contrast
Comparison: [DATE]

CLINICAL DATA: Syncope, simple, normal neuro exam Headache, new or
worsening (Age >= 50y)

EXAM:
CT HEAD WITHOUT CONTRAST
TECHNIQUE: Contiguous axial images were obtained from the base of the skull
through the vertex without intravenous contrast.

[Series 2: head wo · axial · 0.47mm/px · z∈[-194,-54]mm · 8 of 34 slices shown, 10 images]
[im 3/34  brain]
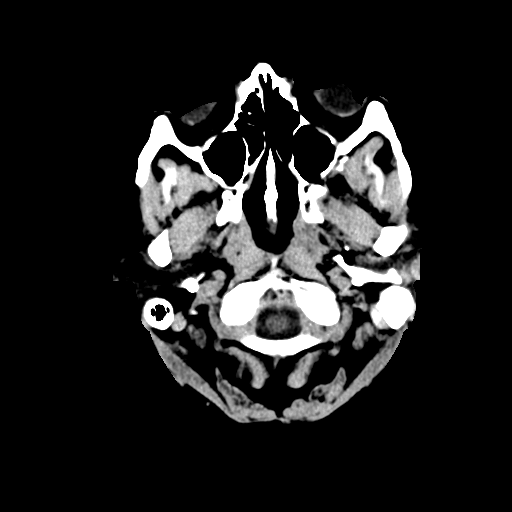
[im 3/34  bone]
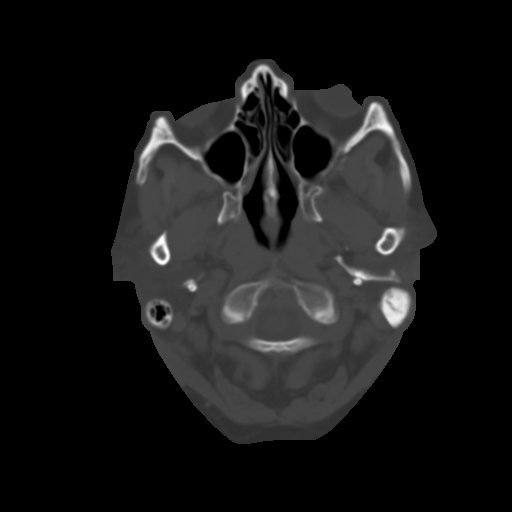
[im 7/34  brain]
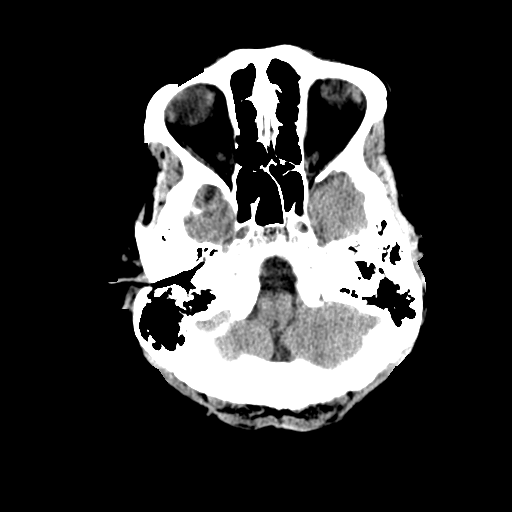
[im 11/34  brain]
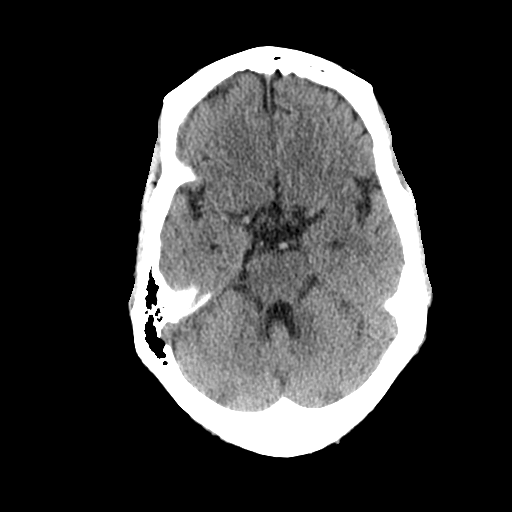
[im 15/34  brain]
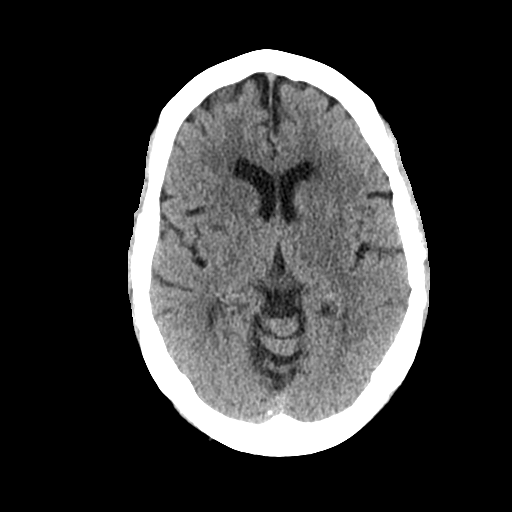
[im 19/34  brain]
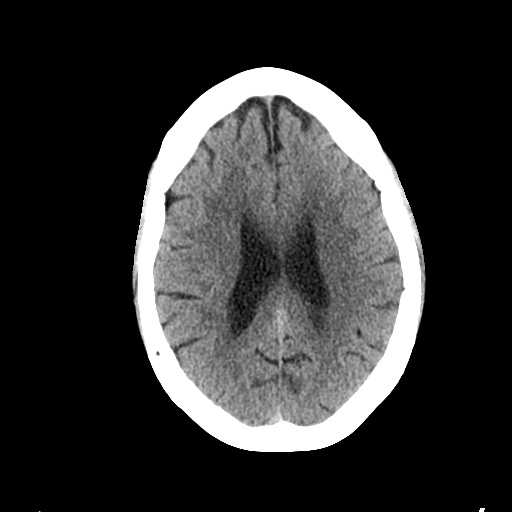
[im 19/34  bone]
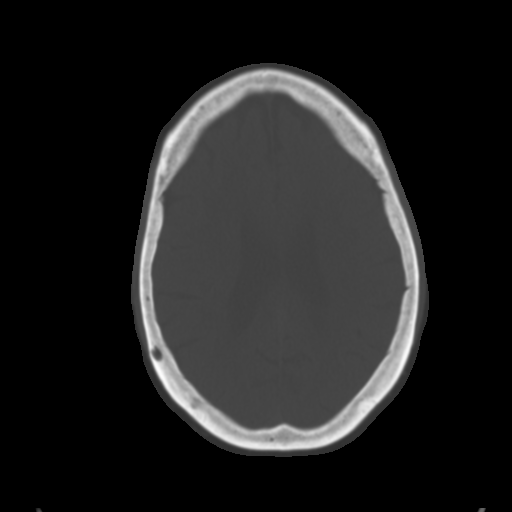
[im 23/34  brain]
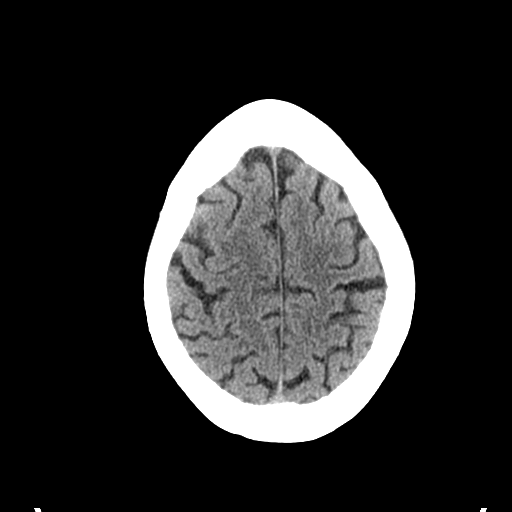
[im 27/34  brain]
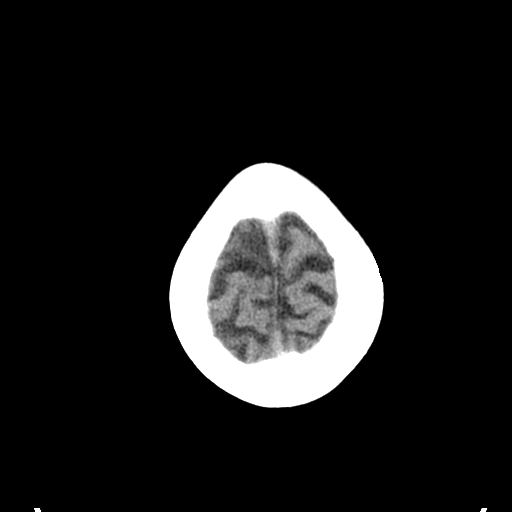
[im 31/34  brain]
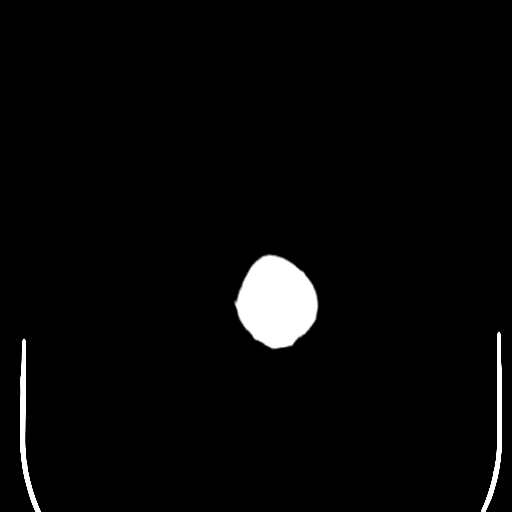

[Series 5: coronal soft tissue · coronal · 0.32mm/px · 3 of 66 slices shown]
[im 22/66  brain]
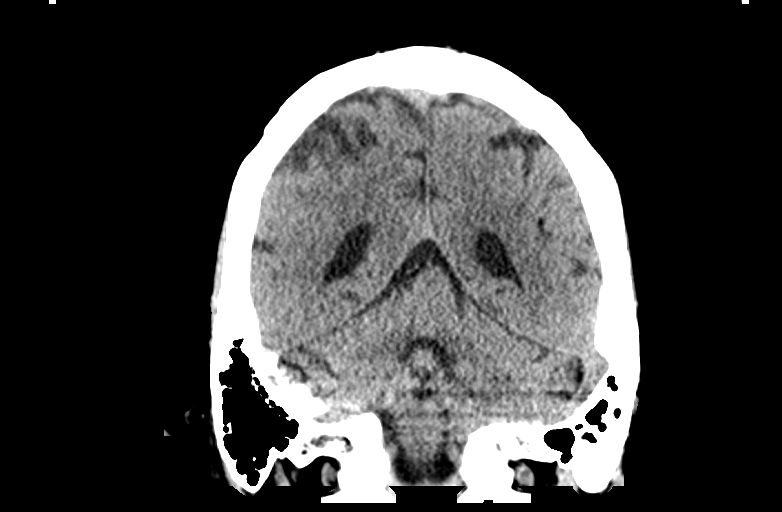
[im 29/66  brain]
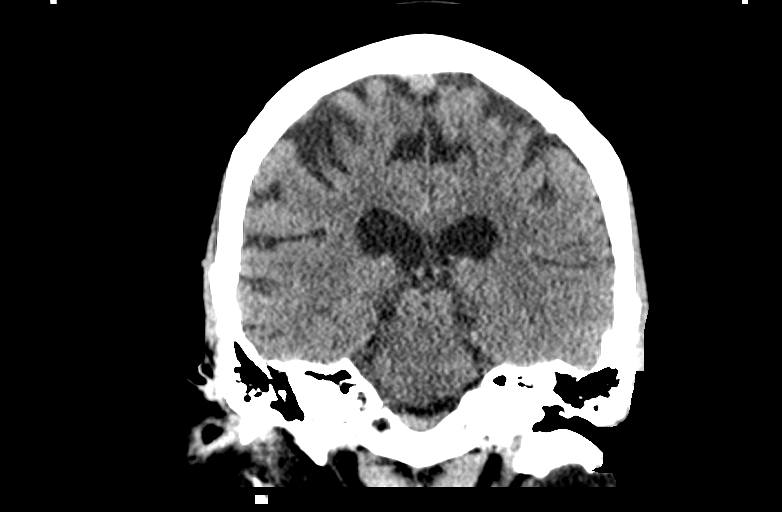
[im 37/66  brain]
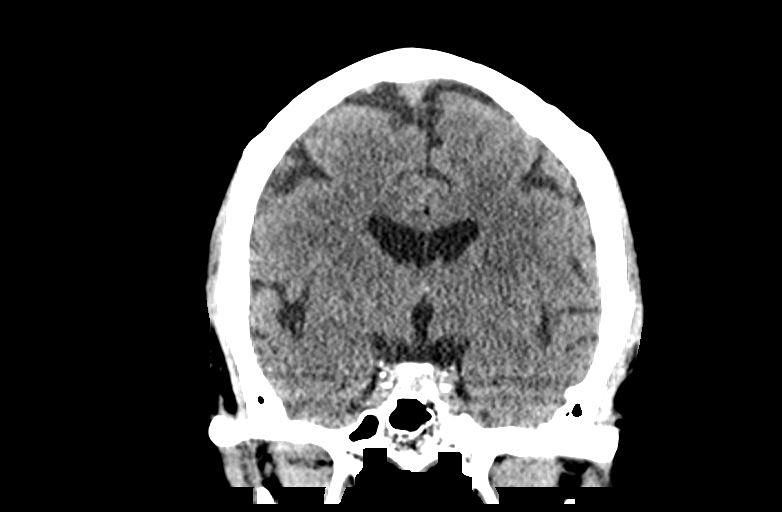

[Series 6: sagittal soft tissue · sagittal · 0.35mm/px · 3 of 54 slices shown]
[im 18/54  brain]
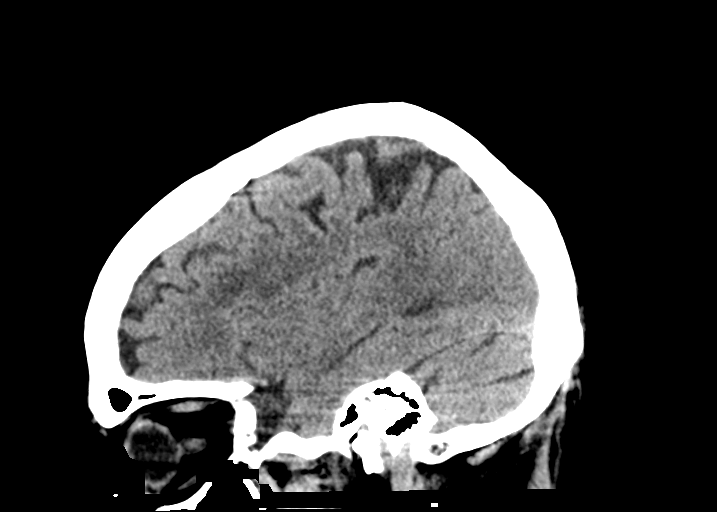
[im 27/54  brain]
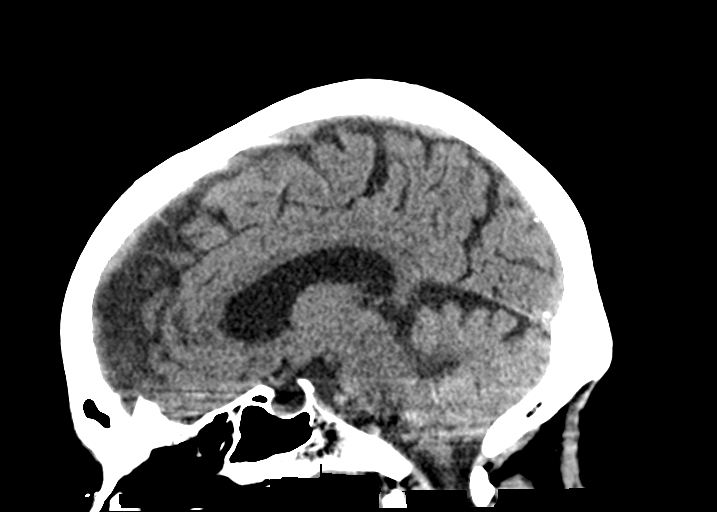
[im 36/54  brain]
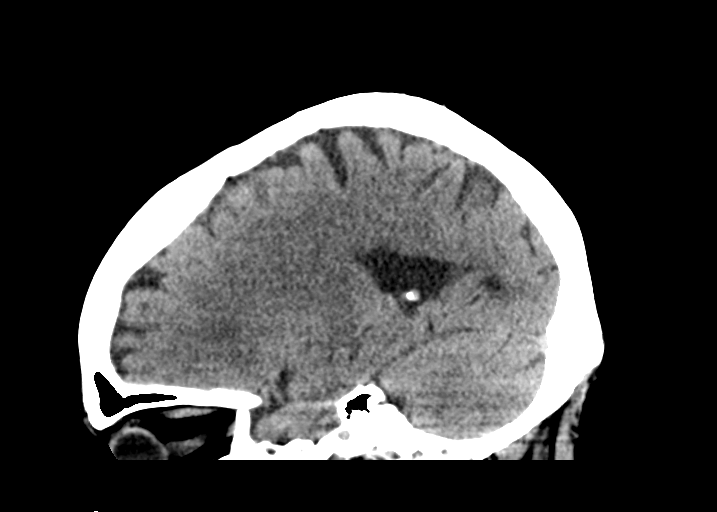

[14 of 47 positions shown; findings below may reference images not displayed]

FINDINGS: Brain: No evidence of acute infarction, hemorrhage, hydrocephalus,
extra-axial collection or mass lesion/mass effect. Scattered
low-density changes within the periventricular and subcortical white
matter compatible with chronic microvascular ischemic change. Mild
diffuse cerebral volume loss.

Vascular: Atherosclerotic calcifications involving the large vessels
of the skull base. No unexpected hyperdense vessel.

Skull: Normal. Negative for fracture or focal lesion.

Sinuses/Orbits: No acute finding.

Other: None.
IMPRESSION: 1. No acute intracranial findings.
2. Mild chronic microvascular ischemic change and cerebral volume
loss.

## 2021-03-05 NOTE — ED Provider Notes (Signed)
Emergency Medicine Provider Triage Evaluation Note  Cassandra Wilkinson , a 60 y.o. female  was evaluated in triage.  Pt complains of a syncopal episode that occurred this morning.  Patient works at [employer] and states that she felt "hot and lightheaded" and syncopized and fell to the floor.  She is unsure of head trauma.  Patient states that they stood her back up and she then syncopized once again for about 10 to 15 seconds.  This was witnessed.  Patient states that she is a mild headache but otherwise is asymptomatic at this time.  Of note, patient was experiencing nausea, vomiting, diarrhea for 2 days on Thursday and Friday and she states that this resolved yesterday.  No previous history of syncope.  She states she is not anticoagulated.  Physical Exam  BP 122/76 (BP Location: Left Arm)   Pulse 85   Temp 99.2 F (37.3 C) (Oral)   Resp 18   LMP 06/24/2012   SpO2 100%  Gen:   Awake, no distress   Resp:  Normal effort  MSK:   Moves extremities without difficulty  Other:  Moving all 4 extremities with ease.  No tongue injuries noted.  Medical Decision Making  Medically screening exam initiated at 1:34 PM.  Appropriate orders placed.  Cassandra Wilkinson was informed that the remainder of the evaluation will be completed by another provider, this initial triage assessment does not replace that evaluation, and the importance of remaining in the ED until their evaluation is complete.   [name], PA-C [id number]    [name], DO [id number]

## 2021-03-05 NOTE — ED Triage Notes (Signed)
Pt presents to the ED via EMS for a witnssed syncopal episode while she was at work. Per EMS, the pt's co-workers did not witness her hit her head. EMS is uncertain how long the pt's LOC lasted. Per EMS, the pt c/o dizziness at present and reports diarrhea and vomiting over the past few days. Pt reports a hx of HTN alone. No past hx of syncope.

## 2021-03-05 NOTE — ED Provider Notes (Signed)
[facility] DEPT Provider Note   CSN: [id number] Arrival date & time: 03/05/21  1304     History Chief Complaint  Patient presents with   Loss of Consciousness    witnessed    Cassandra Wilkinson is a 60 y.o. female.  Patient is a 60 yo female presenting to ED for syncope. Patient admits to feelings of un-wellness this week including nausea, vomiting, chills, generalized abdominal discomfort, and diarrhea. States vomiting and diarrhea resolved 2 days ago but states she synopsized today. Syncope was witnessed in [employer], with head trauma, lasting seconds. Denies any sensation or motor dysfunction at this time. Denies chest pain or sob.   The history is provided by the patient. No language interpreter was used.  Loss of Consciousness Most recent episode:  Today Progression:  Improving Chronicity:  New Associated symptoms: nausea and vomiting   Associated symptoms: no chest pain, no fever, no palpitations, no seizures and no shortness of breath       Past Medical History:  Diagnosis Date   Hypertension     There are no problems to display for this patient.   Past Surgical History:  Procedure Laterality Date   APPENDECTOMY     CESAREAN SECTION     TUBAL LIGATION       OB History   No obstetric history on file.     Family History  Problem Relation Age of Onset   Heart failure Mother    Diabetes Mother    Cancer Father     Social History   Tobacco Use   Smoking status: Every Day    Packs/day: 0.15    Types: Cigarettes   Smokeless tobacco: Never  Vaping Use   Vaping Use: Never used  Substance Use Topics   Alcohol use: No   Drug use: No    Home Medications Prior to Admission medications   Medication Sig Start Date End Date Taking? Authorizing Provider  amLODipine (NORVASC) 10 MG tablet Take 10 mg by mouth daily. 12/18/20  Yes [provider]  Aspirin-Salicylamide-Caffeine (BC HEADACHE POWDER PO) Take 1 Package  by mouth every 4 (four) hours as needed (for pain).   Yes [provider]  lisinopril-hydrochlorothiazide (ZESTORETIC) 20-12.5 MG tablet Take 1 tablet by mouth daily. 06/29/17  Yes Orlie Dakin, MD  amoxicillin (AMOXIL) 500 MG capsule Take 1,000 mg by mouth 2 (two) times daily. 02/28/21   [provider]  cetirizine (ZYRTEC) 10 MG tablet Take 1 tablet (10 mg total) by mouth daily. Patient not taking: No sig reported 07/25/16   Gloriann Loan, PA-C  erythromycin ophthalmic ointment Place a 1/2 inch ribbon of ointment into the lower eyelid 4 times daily for 5 days. Patient not taking: No sig reported 07/25/16   Gloriann Loan, PA-C  fluticasone [facility]) 50 MCG/ACT nasal spray Place 2 sprays into both nostrils daily. Patient not taking: No sig reported 05/14/20   Nuala Alpha A, PA-C    Allergies    Codeine and Food  Review of Systems   Review of Systems  Constitutional:  Negative for chills and fever.  HENT:  Negative for ear pain and sore throat.   Eyes:  Negative for pain and visual disturbance.  Respiratory:  Negative for cough and shortness of breath.   Cardiovascular:  Positive for syncope. Negative for chest pain and palpitations.  Gastrointestinal:  Positive for diarrhea, nausea and vomiting.  Genitourinary:  Negative for dysuria and hematuria.  Musculoskeletal:  Negative for arthralgias and back  pain.  Skin:  Negative for color change and rash.  Neurological:  Positive for syncope. Negative for seizures.  All other systems reviewed and are negative.  Physical Exam Updated Vital Signs BP (!) 141/74   Pulse 65   Temp 99.2 F (37.3 C) (Oral)   Resp 17   LMP 06/24/2012   SpO2 100%   Physical Exam Vitals and nursing note reviewed.  Constitutional:      General: She is not in acute distress.    Appearance: She is well-developed.  HENT:     Head: Normocephalic and atraumatic.  Eyes:     Conjunctiva/sclera: Conjunctivae normal.  Cardiovascular:     Rate and  Rhythm: Normal rate and regular rhythm.     Heart sounds: No murmur heard. Pulmonary:     Effort: Pulmonary effort is normal. No respiratory distress.     Breath sounds: Normal breath sounds.  Abdominal:     Palpations: Abdomen is soft.     Tenderness: There is no abdominal tenderness.  Musculoskeletal:     Cervical back: Neck supple. No bony tenderness.     Thoracic back: No bony tenderness.     Lumbar back: No bony tenderness.  Skin:    General: Skin is warm and dry.  Neurological:     Mental Status: She is alert.     GCS: GCS eye subscore is 4. GCS verbal subscore is 5. GCS motor subscore is 6.     Cranial Nerves: Cranial nerves are intact.     Sensory: Sensation is intact.     Motor: Motor function is intact.     Coordination: Coordination is intact.    ED Results / Procedures / Treatments   Labs (all labs ordered are listed, but only abnormal results are displayed) Labs Reviewed  COMPREHENSIVE METABOLIC PANEL - Abnormal; Notable for the following components:      Result Value   Potassium 3.2 (*)    Chloride 97 (*)    Glucose, Bld 107 (*)    BUN 21 (*)    Creatinine, Ser 1.20 (*)    GFR, Estimated 52 (*)    All other components within normal limits  CBC WITH DIFFERENTIAL/PLATELET - Abnormal; Notable for the following components:   Neutro Abs 8.0 (*)    All other components within normal limits  RESP PANEL BY RT-PCR (FLU A&B, COVID) ARPGX2  BRAIN NATRIURETIC PEPTIDE  MAGNESIUM  URINALYSIS, ROUTINE W REFLEX MICROSCOPIC  CBG MONITORING, ED  CBG MONITORING, ED  TROPONIN I (HIGH SENSITIVITY)  TROPONIN I (HIGH SENSITIVITY)    EKG EKG Interpretation  Date/Time:  Sunday March 05 2021 13:23:17 EDT Ventricular Rate:  86 PR Interval:  135 QRS Duration: 96 QT Interval:  349 QTC Calculation: 418 R Axis:   17 Text Interpretation: Sinus rhythm Nonspecific T abnormalities, inferior leads Baseline wander in lead(s) I Since last tracing inferior T wave abnormality is  more prominent Otherwise no significant change Confirmed by Daleen Bo [phone]) on 03/05/2021 1:38:53 PM  Radiology CT HEAD WO CONTRAST (5MM)  Result Date: 03/05/2021 CLINICAL DATA:  Syncope, simple, normal neuro exam Headache, new or worsening (Age >= 50y) EXAM: CT HEAD WITHOUT CONTRAST TECHNIQUE: Contiguous axial images were obtained from the base of the skull through the vertex without intravenous contrast. COMPARISON:  05/14/2020 FINDINGS: Brain: No evidence of acute infarction, hemorrhage, hydrocephalus, extra-axial collection or mass lesion/mass effect. Scattered low-density changes within the periventricular and subcortical white matter compatible with chronic microvascular ischemic change. Mild diffuse cerebral  volume loss. Vascular: Atherosclerotic calcifications involving the large vessels of the skull base. No unexpected hyperdense vessel. Skull: Normal. Negative for fracture or focal lesion. Sinuses/Orbits: No acute finding. Other: None. IMPRESSION: 1. No acute intracranial findings. 2. Mild chronic microvascular ischemic change and cerebral volume loss. Electronically Signed   By: Davina Poke D.O.   On: 03/05/2021 13:52    Procedures Procedures   Medications Ordered in ED Medications - No data to display  ED Course  I have reviewed the triage vital signs and the nursing notes.  Pertinent labs & imaging results that were available during my care of the patient were reviewed by me and considered in my medical decision making (see chart for details).    MDM Rules/Calculators/A&P                          4:13 PM Patient presents with syncopal symptoms without worrisome features. Presentation most suggestive of neuro-cardiogenic or orthostatic cause secondary to dehydration from recent gastroenteritis. Covid 19 PCR negative. Stable electrolytes. Very low suspicion for serious arrhythmia, cardiac ischemia or other serious etiology. ECG reviewed, no evidence of a cardiac  arrhythmia such as Brugada, WPW, HOCM, IHSS, Long or short QT. Neurologic exam is nonfocal, not consistent with CVA or primary neurologic abnormality.   -Syncope included blunt head trauma. No external signs of trauma. CT demonstrates no acute process.   Patient appears safe for discharge with outpatient observation and close PCP F/U. Syncope warnings discussed with patient. The patient has been instructed to return immediately if the symptoms worsen in any way. Patient verbalized understanding and is in agreement with current care plan. All questions answered prior to discharge.  Final Clinical Impression(s) / ED Diagnoses Final diagnoses:  Dehydration  AKI (acute kidney injury) ([place])  Syncope, unspecified syncope type    Rx / DC Orders ED Discharge Orders     None        [name], DO [id number]

## 2021-03-05 NOTE — Discharge Instructions (Addendum)
Syncope likely secondary to dehydration. Drink 6-8 12 oz of water daily

## 2021-07-28 LAB — EXTERNAL GENERIC LAB PROCEDURE: COLOGUARD: NEGATIVE

## 2021-07-28 LAB — COLOGUARD: COLOGUARD: NEGATIVE

## 2021-09-22 ENCOUNTER — Inpatient Hospital Stay (HOSPITAL_COMMUNITY): Payer: 59

## 2021-09-22 ENCOUNTER — Emergency Department (HOSPITAL_COMMUNITY): Payer: 59

## 2021-09-22 ENCOUNTER — Encounter (HOSPITAL_COMMUNITY): Payer: Self-pay

## 2021-09-22 ENCOUNTER — Inpatient Hospital Stay (HOSPITAL_COMMUNITY)
Admission: EM | Admit: 2021-09-22 | Discharge: 2021-09-26 | DRG: 418 | Disposition: A | Discharge status: Home / Self Care | Payer: 59 | Attending: Family Medicine | Admitting: Family Medicine

## 2021-09-22 DIAGNOSIS — K810 Acute cholecystitis: Secondary | ICD-10-CM

## 2021-09-22 DIAGNOSIS — I10 Essential (primary) hypertension: Secondary | ICD-10-CM | POA: Diagnosis present

## 2021-09-22 DIAGNOSIS — E876 Hypokalemia: Secondary | ICD-10-CM | POA: Diagnosis present

## 2021-09-22 DIAGNOSIS — Z20822 Contact with and (suspected) exposure to covid-19: Secondary | ICD-10-CM | POA: Diagnosis present

## 2021-09-22 DIAGNOSIS — Z6834 Body mass index (BMI) 34.0-34.9, adult: Secondary | ICD-10-CM

## 2021-09-22 DIAGNOSIS — F17210 Nicotine dependence, cigarettes, uncomplicated: Secondary | ICD-10-CM | POA: Diagnosis present

## 2021-09-22 DIAGNOSIS — K8062 Calculus of gallbladder and bile duct with acute cholecystitis without obstruction: Secondary | ICD-10-CM | POA: Diagnosis present

## 2021-09-22 DIAGNOSIS — K8050 Calculus of bile duct without cholangitis or cholecystitis without obstruction: Secondary | ICD-10-CM | POA: Diagnosis not present

## 2021-09-22 DIAGNOSIS — Z79899 Other long term (current) drug therapy: Secondary | ICD-10-CM

## 2021-09-22 DIAGNOSIS — Z833 Family history of diabetes mellitus: Secondary | ICD-10-CM

## 2021-09-22 DIAGNOSIS — Z8249 Family history of ischemic heart disease and other diseases of the circulatory system: Secondary | ICD-10-CM

## 2021-09-22 DIAGNOSIS — R1011 Right upper quadrant pain: Secondary | ICD-10-CM | POA: Diagnosis present

## 2021-09-22 DIAGNOSIS — K851 Biliary acute pancreatitis without necrosis or infection: Secondary | ICD-10-CM | POA: Diagnosis present

## 2021-09-22 DIAGNOSIS — Z91018 Allergy to other foods: Secondary | ICD-10-CM

## 2021-09-22 DIAGNOSIS — Z885 Allergy status to narcotic agent status: Secondary | ICD-10-CM

## 2021-09-22 DIAGNOSIS — K8020 Calculus of gallbladder without cholecystitis without obstruction: Secondary | ICD-10-CM

## 2021-09-22 LAB — COMPREHENSIVE METABOLIC PANEL
ALT: 353 U/L — ABNORMAL HIGH (ref 0–44)
AST: 332 U/L — ABNORMAL HIGH (ref 15–41)
Albumin: 4 g/dL (ref 3.5–5.0)
Alkaline Phosphatase: 170 U/L — ABNORMAL HIGH (ref 38–126)
Anion gap: 9 (ref 5–15)
BUN: 11 mg/dL (ref 6–20)
CO2: 26 mmol/L (ref 22–32)
Calcium: 9 mg/dL (ref 8.9–10.3)
Chloride: 102 mmol/L (ref 98–111)
Creatinine, Ser: 0.86 mg/dL (ref 0.44–1.00)
GFR, Estimated: >60 mL/min (ref >60)
Glucose, Bld: 112 mg/dL — ABNORMAL HIGH (ref 70–99)
Potassium: 3.2 mmol/L — ABNORMAL LOW (ref 3.5–5.1)
Sodium: 137 mmol/L (ref 135–145)
Total Bilirubin: 4.4 mg/dL — ABNORMAL HIGH (ref 0.3–1.2)
Total Protein: 7.8 g/dL (ref 6.5–8.1)

## 2021-09-22 LAB — CBC WITH DIFFERENTIAL/PLATELET
Abs Immature Granulocytes: 0.02 10*3/uL (ref 0.00–0.07)
Basophils Absolute: 0 10*3/uL (ref 0.0–0.1)
Basophils Relative: 0 %
Eosinophils Absolute: 0 10*3/uL (ref 0.0–0.5)
Eosinophils Relative: 0 %
HCT: 44.4 % (ref 36.0–46.0)
Hemoglobin: 14.7 g/dL (ref 12.0–15.0)
Immature Granulocytes: 0 %
Lymphocytes Relative: 16 %
Lymphs Abs: 0.8 10*3/uL (ref 0.7–4.0)
MCH: 32 pg (ref 26.0–34.0)
MCHC: 33.1 g/dL (ref 30.0–36.0)
MCV: 96.7 fL (ref 80.0–100.0)
Monocytes Absolute: 0.4 10*3/uL (ref 0.1–1.0)
Monocytes Relative: 9 %
Neutro Abs: 3.8 10*3/uL (ref 1.7–7.7)
Neutrophils Relative %: 75 %
Platelets: 273 10*3/uL (ref 150–400)
RBC: 4.59 MIL/uL (ref 3.87–5.11)
RDW: 14.9 % (ref 11.5–15.5)
WBC: 5 10*3/uL (ref 4.0–10.5)
nRBC: 0 % (ref 0.0–0.2)

## 2021-09-22 LAB — RESP PANEL BY RT-PCR (FLU A&B, COVID) ARPGX2
Influenza A by PCR: NEGATIVE
Influenza B by PCR: NEGATIVE
SARS Coronavirus 2 by RT PCR: NEGATIVE

## 2021-09-22 LAB — URINALYSIS, ROUTINE W REFLEX MICROSCOPIC
Bacteria, UA: NONE SEEN
Bilirubin Urine: NEGATIVE
Glucose, UA: NEGATIVE mg/dL
Ketones, ur: NEGATIVE mg/dL
Leukocytes,Ua: NEGATIVE
Nitrite: NEGATIVE
Protein, ur: NEGATIVE mg/dL
Specific Gravity, Urine: 1.012 (ref 1.005–1.030)
pH: 5 (ref 5.0–8.0)

## 2021-09-22 LAB — MAGNESIUM: Magnesium: 1.9 mg/dL (ref 1.7–2.4)

## 2021-09-22 LAB — HIV ANTIBODY (ROUTINE TESTING W REFLEX): HIV Screen 4th Generation wRfx: NONREACTIVE

## 2021-09-22 LAB — LIPASE, BLOOD: Lipase: 2483 U/L — ABNORMAL HIGH (ref 11–51)

## 2021-09-22 IMAGING — MR MR 3D RECON AT [facility]
16 of 20 series · 37 of 48 positions shown · IV contrast (gadavist)
Comparison: CT chest angiogram, [DATE]

EXAM:
MRI ABDOMEN WITHOUT AND WITH CONTRAST (INCLUDING MRCP)
TECHNIQUE: Multiplanar multisequence MR imaging of the abdomen was performed
both before and after the administration of intravenous contrast.
Heavily T2-weighted images of the biliary and pancreatic ducts were
obtained, and three-dimensional MRCP images were rendered by post
processing.

CONTRAST:  10mL GADAVIST GADOBUTROL 1 MMOL/ML IV SOLN

[Series 3: DWI · axial · 6.0mm · 1.57mm/px · z∈[-98,+154]mm · 3 of 72 slices shown (1 of 2)]
[im 1/72]
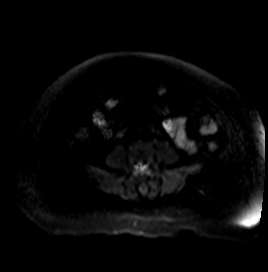
[im 36/72]
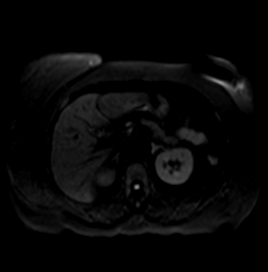
[im 72/72]
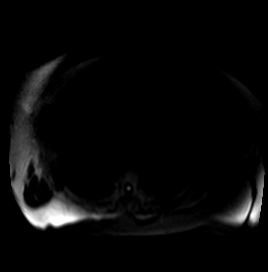

[Series 4: DWI · axial · 6.0mm · 1.57mm/px · 1 of 36 slices shown (2 of 2)]
[im 1/36]
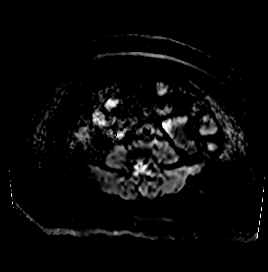

[Series 5: T2 fat-sat · axial · 6.0mm · 1.31mm/px · 1 of 36 slices shown]
[im 1/36]
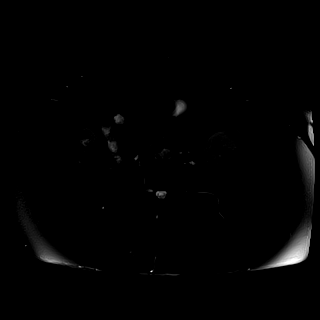

[Series 8: cor_3d_spc_trig · coronal · 1.0mm · 0.49mm/px · 3 of 72 slices shown]
[im 1/72]
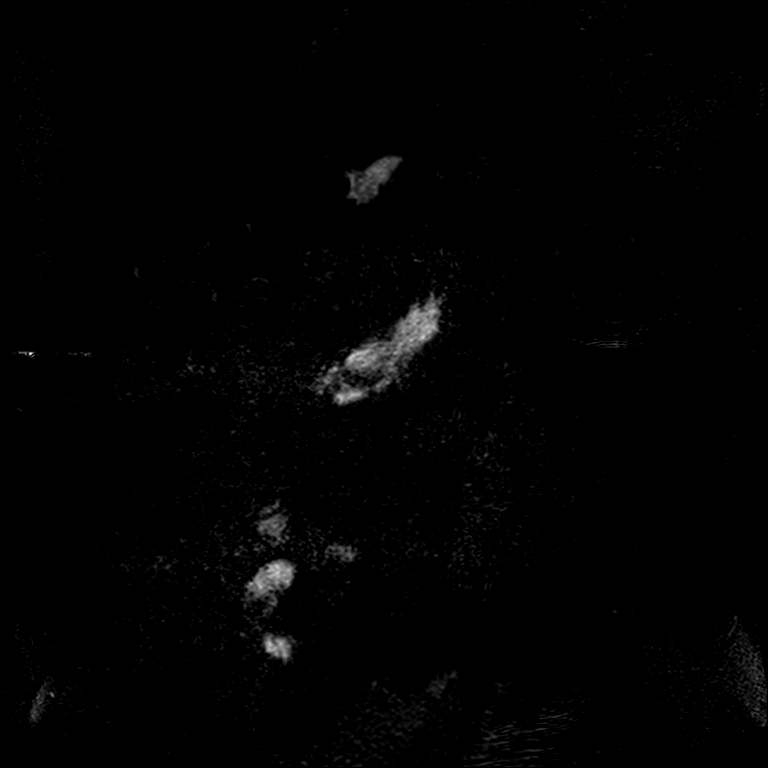
[im 36/72]
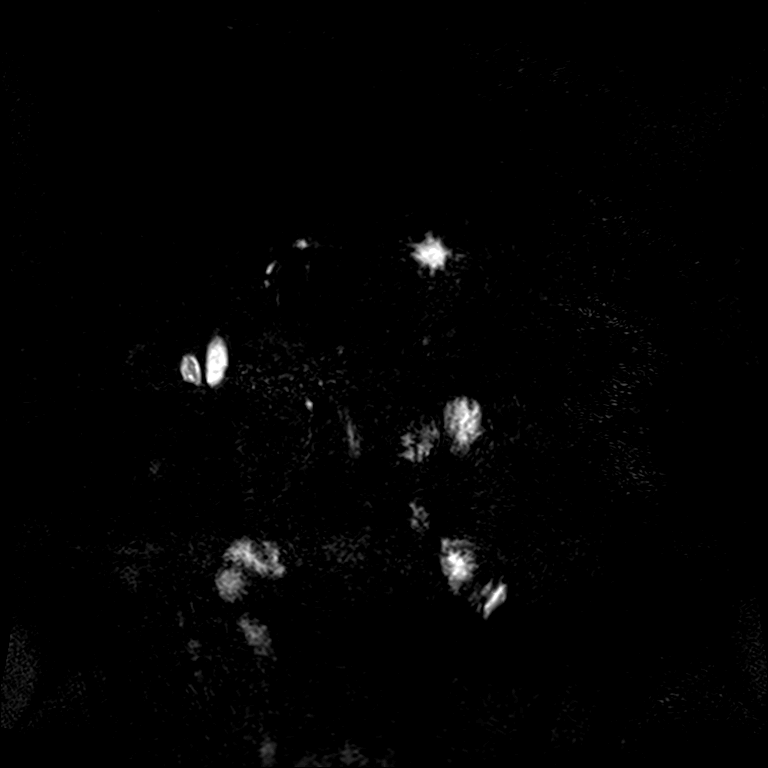
[im 72/72]
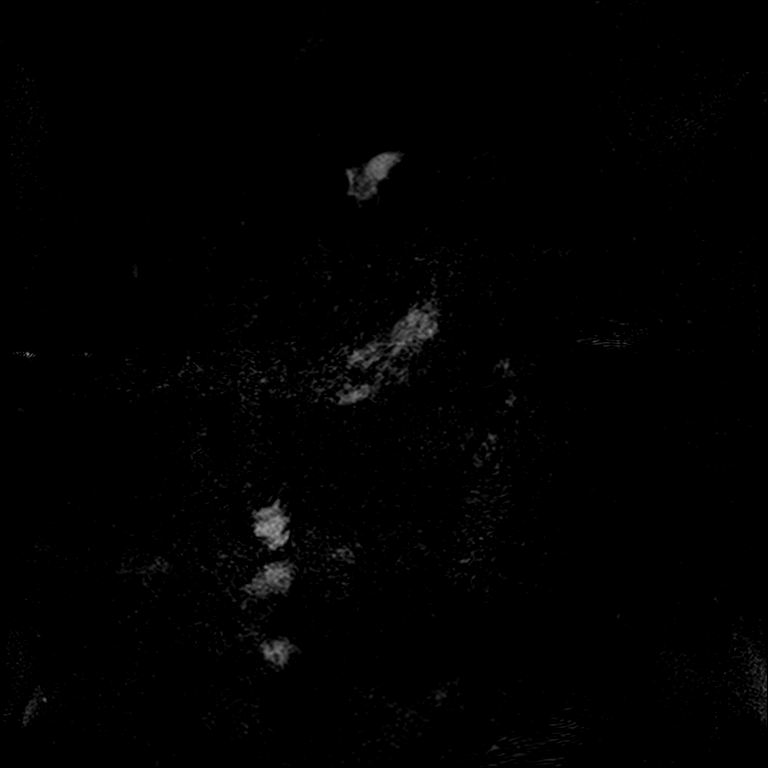

[Series 13: T2 · coronal · 6.0mm · 1.64mm/px · 1 of 41 slices shown (1 of 2)]
[im 1/41]
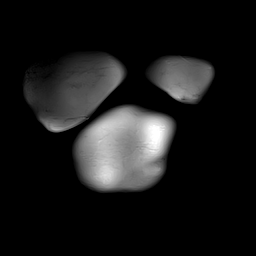

[Series 14: T1 · axial · 3.5mm · 1.25mm/px · z∈[-106,+143]mm · 3 of 72 slices shown (1 of 2)]
[im 1/72]
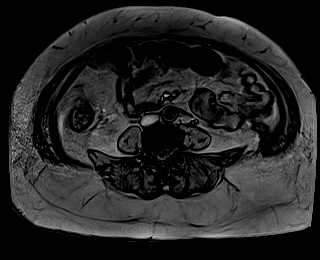
[im 36/72]
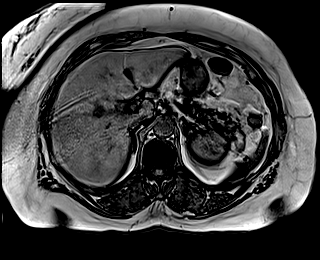
[im 72/72]
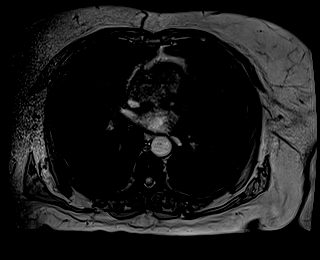

[Series 15: T1 · axial · 3.5mm · 1.25mm/px · z∈[-106,+143]mm · 3 of 72 slices shown (2 of 2)]
[im 1/72]
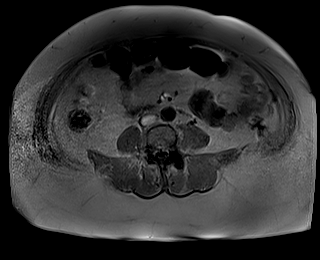
[im 36/72]
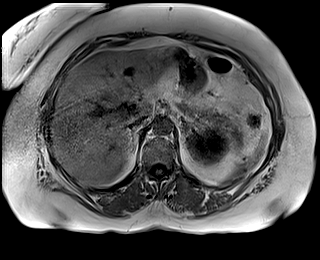
[im 72/72]
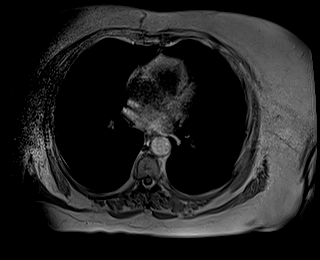

[Series 16: cor obl thk · sagittal · 50.0mm · 0.78mm/px · 1 of 9 slices shown]
[im 1/9]
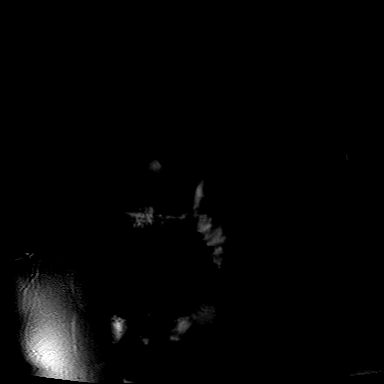

[Series 17: T2 · axial · 6.0mm · 1.64mm/px · 1 of 40 slices shown (2 of 2)]
[im 1/40]
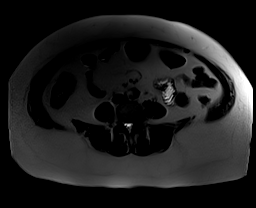

[Series 19: T1 dynamic · axial · 3.0mm · 1.31mm/px · z∈[-104,+157]mm · 3 of 88 slices shown (1 of 7)]
[im 1/88]
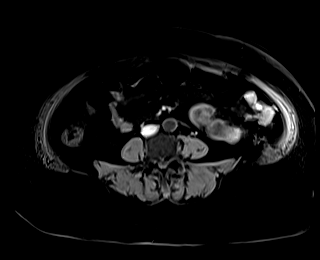
[im 44/88]
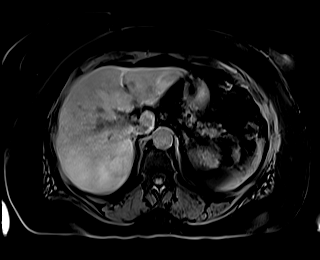
[im 88/88]
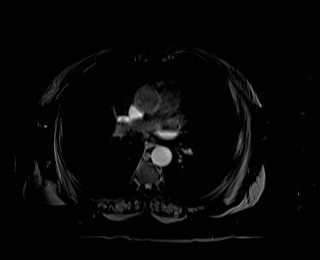

[Series 23: T1 dynamic · axial · 3.0mm · 1.31mm/px · z∈[-104,+157]mm · 3 of 88 slices shown (2 of 7)]
[im 1/88]
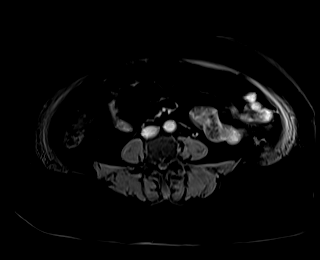
[im 44/88]
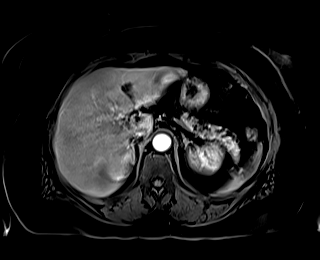
[im 88/88]
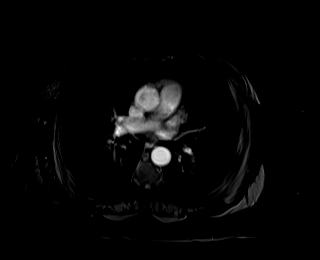

[Series 24: T1 dynamic · axial · 3.0mm · 1.31mm/px · z∈[-104,+157]mm · 3 of 88 slices shown (3 of 7)]
[im 1/88]
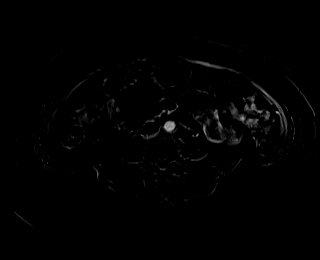
[im 44/88]
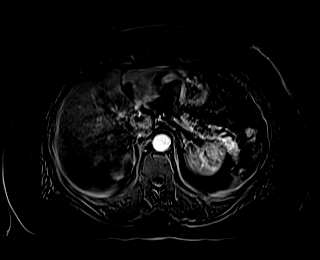
[im 88/88]
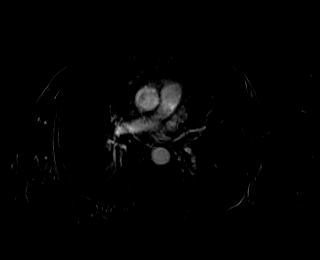

[Series 27: T1 dynamic · axial · 3.0mm · 1.31mm/px · z∈[-104,+157]mm · 3 of 88 slices shown (4 of 7)]
[im 1/88]
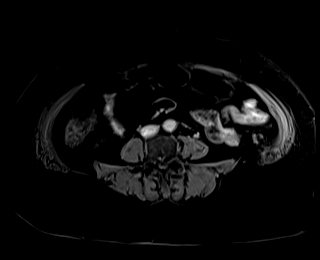
[im 44/88]
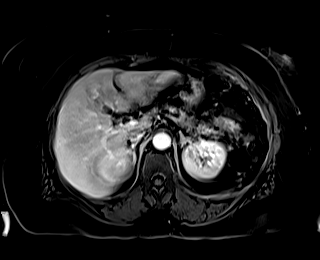
[im 88/88]
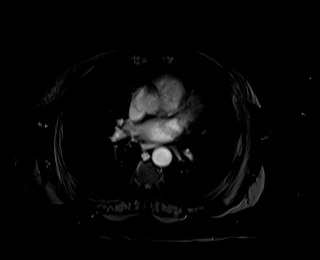

[Series 28: T1 dynamic · axial · 3.0mm · 1.31mm/px · z∈[-104,+157]mm · 3 of 88 slices shown (5 of 7)]
[im 1/88]
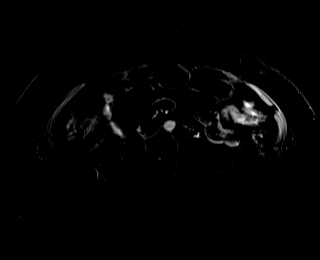
[im 44/88]
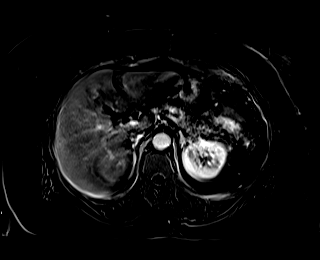
[im 88/88]
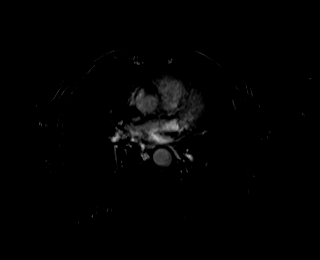

[Series 31: T1 dynamic · axial · 3.0mm · 1.31mm/px · z∈[-104,+157]mm · 3 of 88 slices shown (6 of 7)]
[im 1/88]
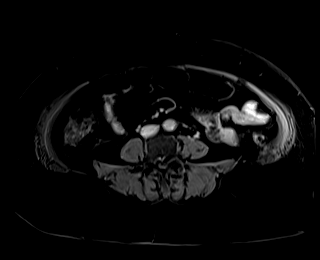
[im 44/88]
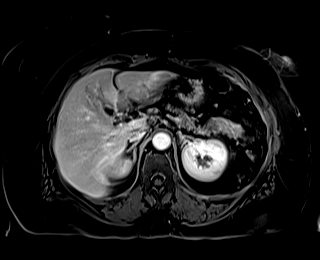
[im 88/88]
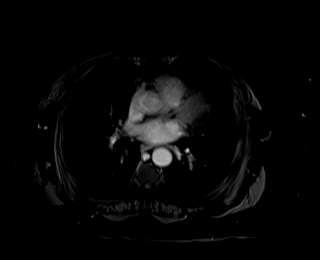

[Series 32: T1 dynamic · axial · 3.0mm · 1.31mm/px · z∈[-104,+25]mm · 2 of 88 slices shown (7 of 7)]
[im 1/88]
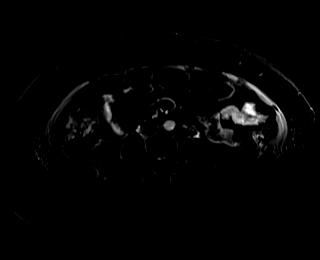
[im 44/88]
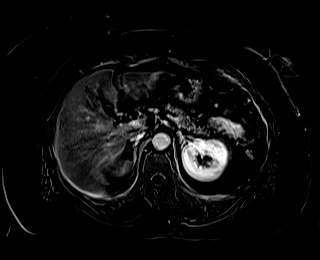

[37 of 48 positions shown; findings below may reference images not displayed]

FINDINGS: Lower chest: No acute findings.  Small hiatal hernia.

Hepatobiliary: Benign simple cyst of the left lobe of the liver
(series 17, image 21). No mass or other parenchymal abnormality
identified. Numerous gallstones in the gallbladder, and several
small calculi in the gallbladder neck as well as within the proximal
cystic duct measuring no greater than 0.3 cm (series 8, image 47).
Low insertion of the cystic duct on the common bile duct. The common
bile duct measures up to 0.8 cm in caliber. No intrahepatic biliary
ductal dilatation.

Pancreas: No mass, inflammatory changes, or other parenchymal
abnormality identified.No pancreatic ductal dilatation.

Spleen:  Within normal limits in size and appearance.

Adrenals/Urinary Tract: Stable, definitively benign fat containing
left adrenal adenoma (series 14, image 44). No renal masses or
suspicious contrast enhancement identified. No evidence of
hydronephrosis.

Stomach/Bowel: Visualized portions within the abdomen are
unremarkable.

Vascular/Lymphatic: No pathologically enlarged lymph nodes
identified. No abdominal aortic aneurysm demonstrated.

Other:  None.

Musculoskeletal: No suspicious osseous lesions identified.
IMPRESSION: 1. Numerous gallstones in the gallbladder, and several small calculi
in the gallbladder neck as well as within the proximal cystic duct
measuring no greater than 0.3 cm. No gallbladder wall thickening or
inflammatory findings.
2. The common bile duct measures up to 0.8 cm in caliber, near the
upper limit of normal in caliber, of uncertain significance, however
without common bile duct calculus or other obstructing lesion
identified to the ampulla.
3. Small hiatal hernia.

## 2021-09-22 IMAGING — US US ABDOMEN LIMITED
1 series · 15 of 25 positions shown · non-contrast
Comparison: CTA chest [DATE].

CLINICAL DATA: 60 year old female with right upper quadrant
abdominal pain.

EXAM:
ULTRASOUND ABDOMEN LIMITED RIGHT UPPER QUADRANT

[Series 1: us abdomen limited mc & wl · 15 of 56 slices shown]
[im 1/56]
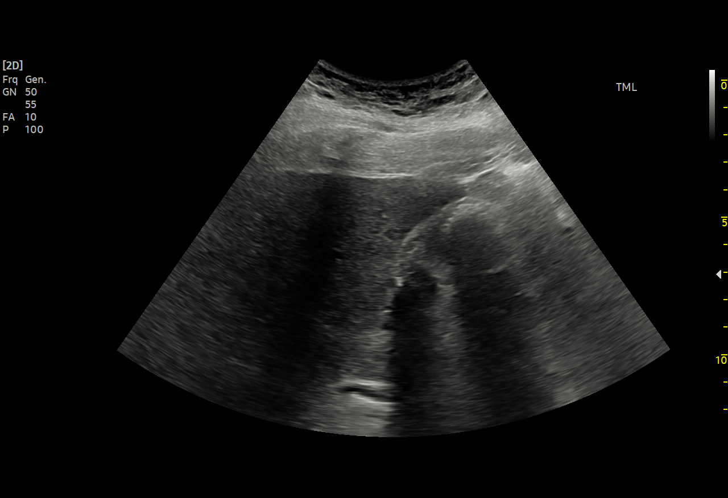
[im 5/56]
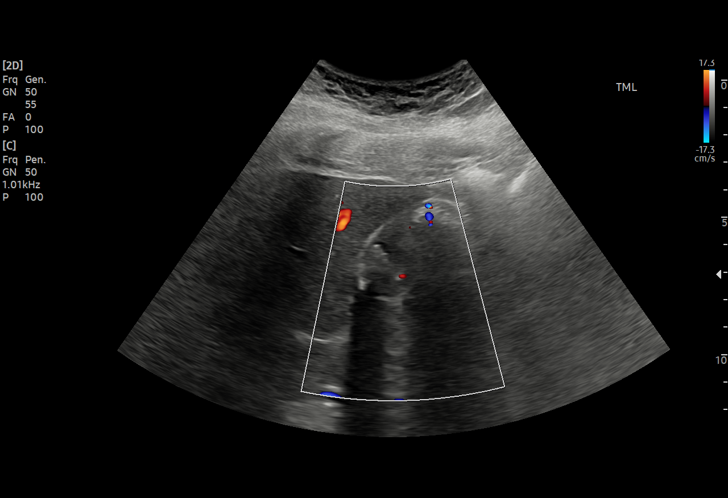
[im 10/56]
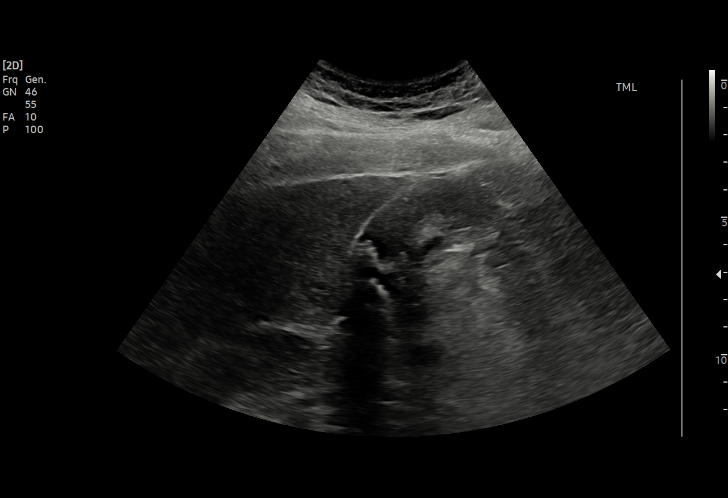
[im 12/56]
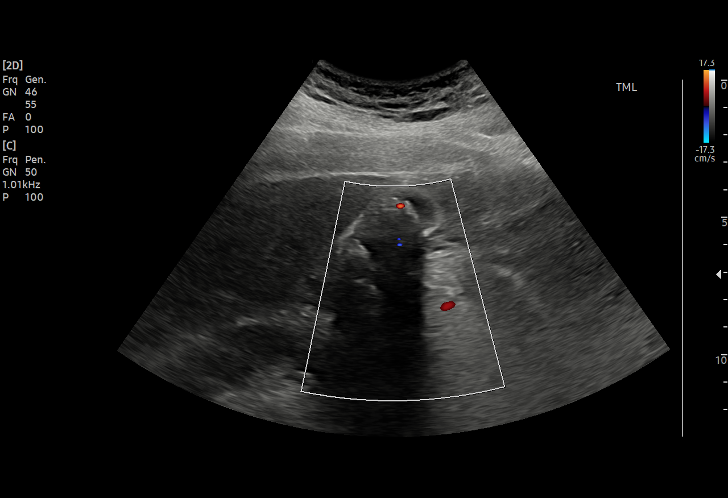
[im 17/56]
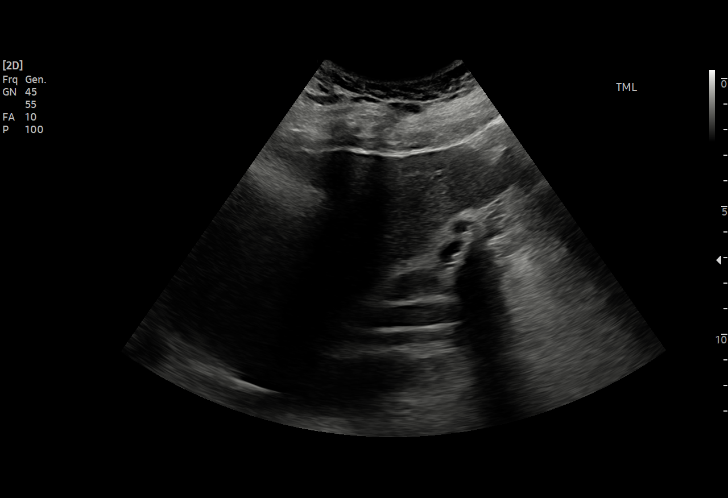
[im 21/56]
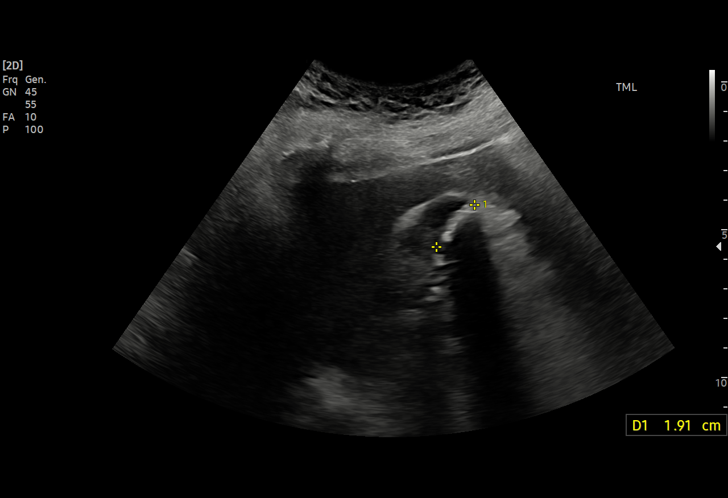
[im 23/56]
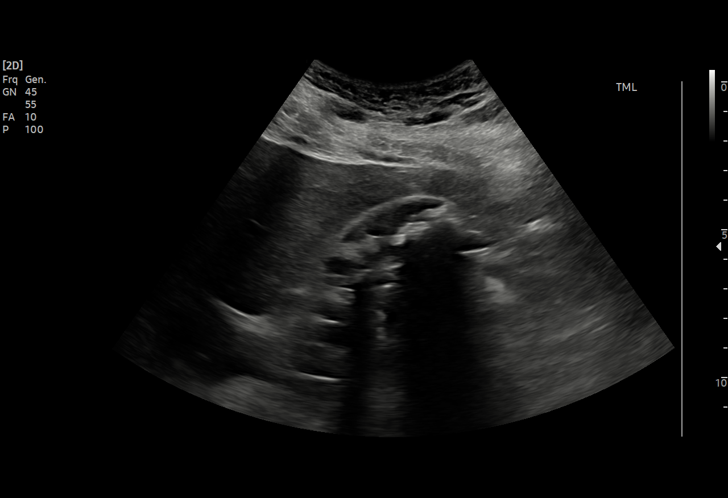
[im 28/56]
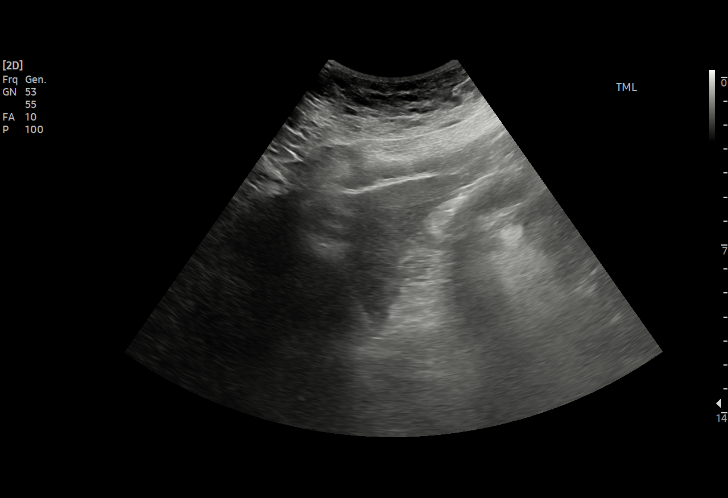
[im 33/56]
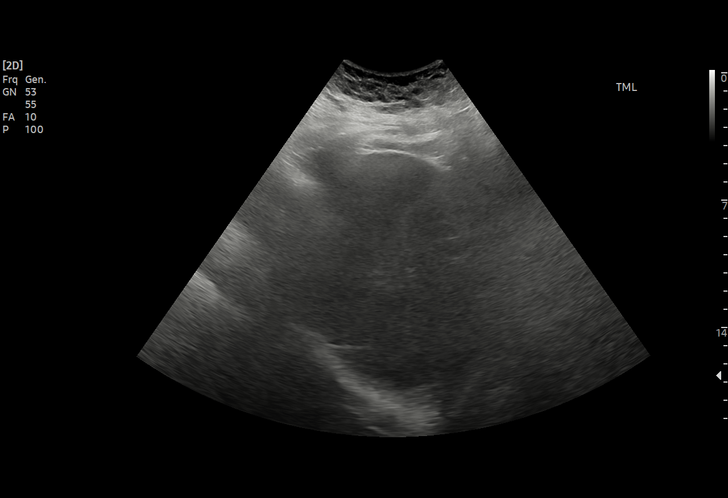
[im 35/56]
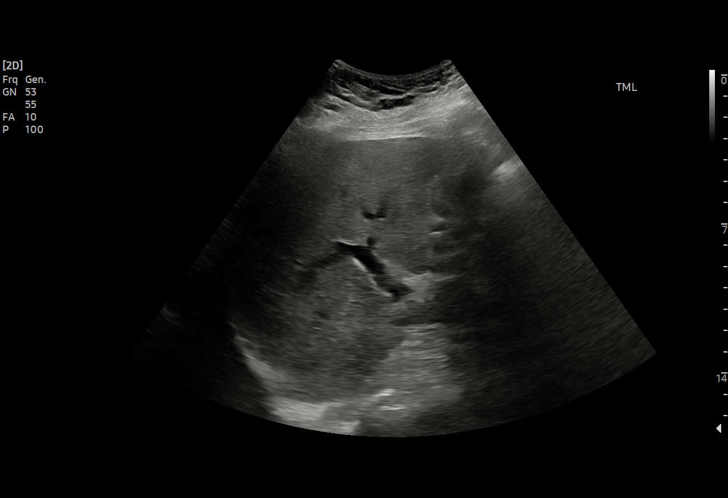
[im 39/56]
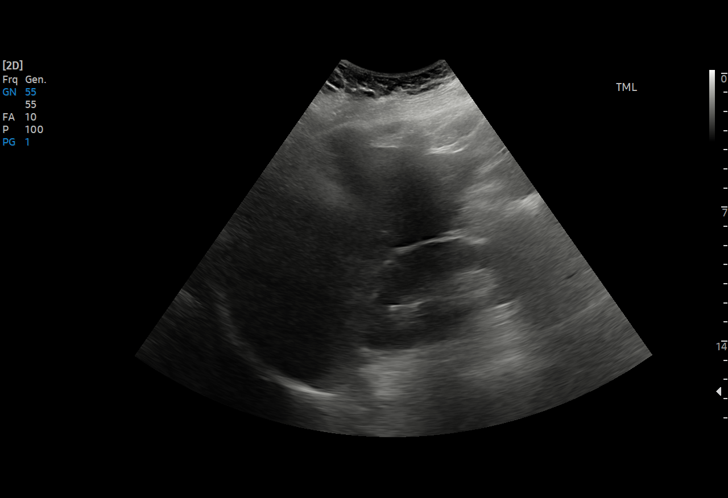
[im 44/56]
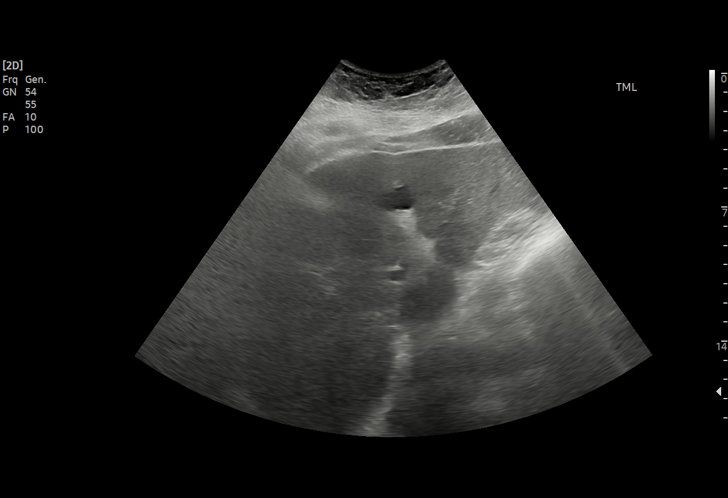
[im 46/56]
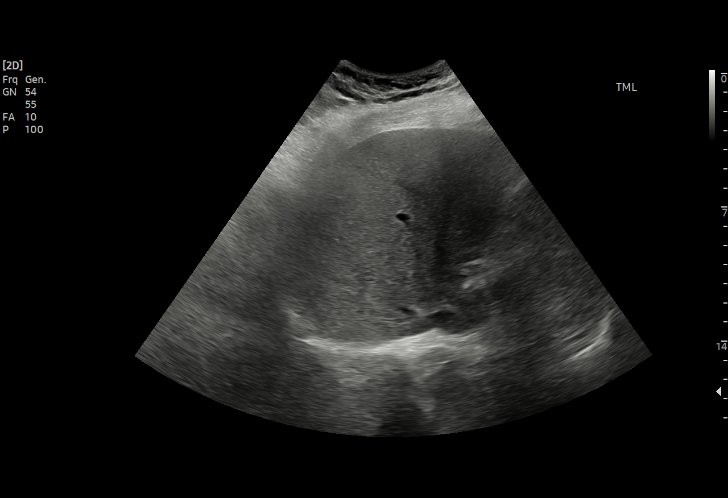
[im 51/56]
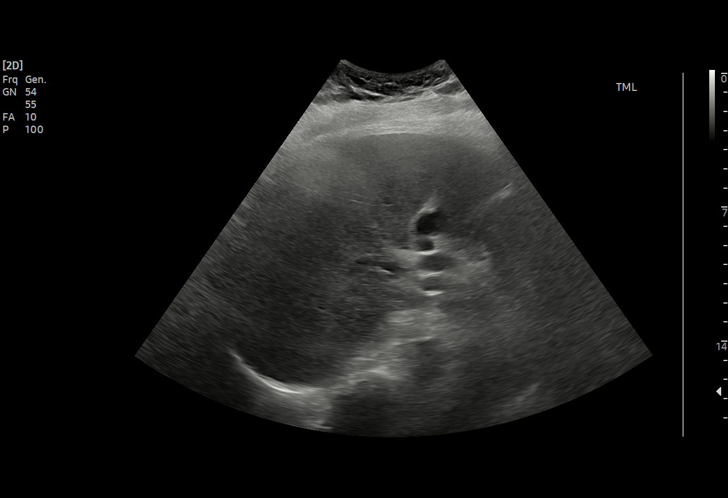
[im 56/56]
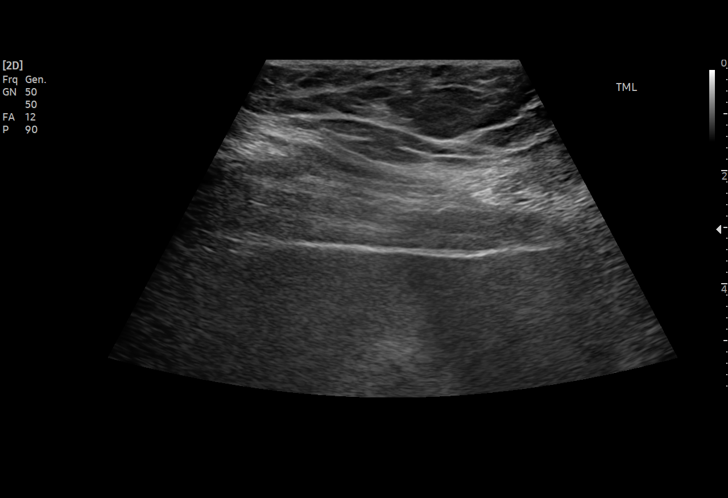

[15 of 25 positions shown; findings below may reference images not displayed]

FINDINGS: Gallbladder:

Wall-echo-shadow sign (JACKSON sign) indicating a gallbladder contracted
around numerous stones (image 4). Positive sonographic Murphy sign
elicited. No pericholecystic fluid is evident. Estimated individual
gallstones size up to 19 mm.

Common bile duct:

Diameter: 10 mm, dilated.

Liver:

Questionable central, but no peripheral intrahepatic biliary ductal
dilatation identified. No discrete liver lesion. Background liver
echogenicity within normal limits. Portal vein is patent on color
Doppler imaging with normal direction of blood flow towards the
liver.

Other: Negative visible right kidney.
IMPRESSION: Gallbladder contracted around numerous stones with dilated CBD and
positive sonographic Murphy sign suspicious for Choledocholithiasis
+/- Acute Cholecystitis.

## 2021-09-22 IMAGING — MR MR ABDOMEN WO/W CM MRCP
16 of 20 series · 37 of 48 positions shown · IV contrast (10 GADAVIST)
Comparison: CT chest angiogram, [DATE]

EXAM:
MRI ABDOMEN WITHOUT AND WITH CONTRAST (INCLUDING MRCP)
TECHNIQUE: Multiplanar multisequence MR imaging of the abdomen was performed
both before and after the administration of intravenous contrast.
Heavily T2-weighted images of the biliary and pancreatic ducts were
obtained, and three-dimensional MRCP images were rendered by post
processing.

CONTRAST:  10mL GADAVIST GADOBUTROL 1 MMOL/ML IV SOLN

[Series 2: DWI · axial · 6.0mm · 1.57mm/px · z∈[-98,+154]mm · 3 of 72 slices shown (1 of 2)]
[im 1/72]
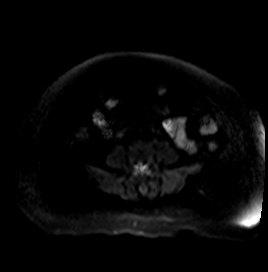
[im 36/72]
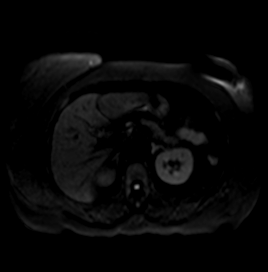
[im 72/72]
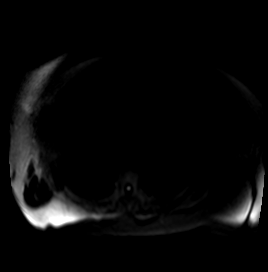

[Series 3: DWI · axial · 6.0mm · 1.57mm/px · 1 of 36 slices shown (2 of 2)]
[im 1/36]
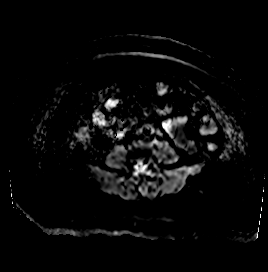

[Series 4: T2 fat-sat · axial · 6.0mm · 1.31mm/px · 1 of 36 slices shown]
[im 1/36]
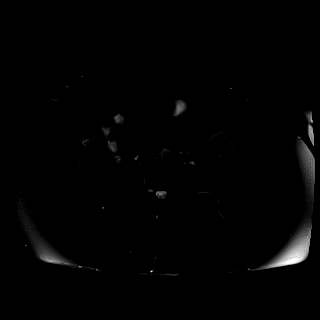

[Series 7: cor_3d_spc_trig · coronal · 1.0mm · 0.49mm/px · 3 of 72 slices shown]
[im 1/72]
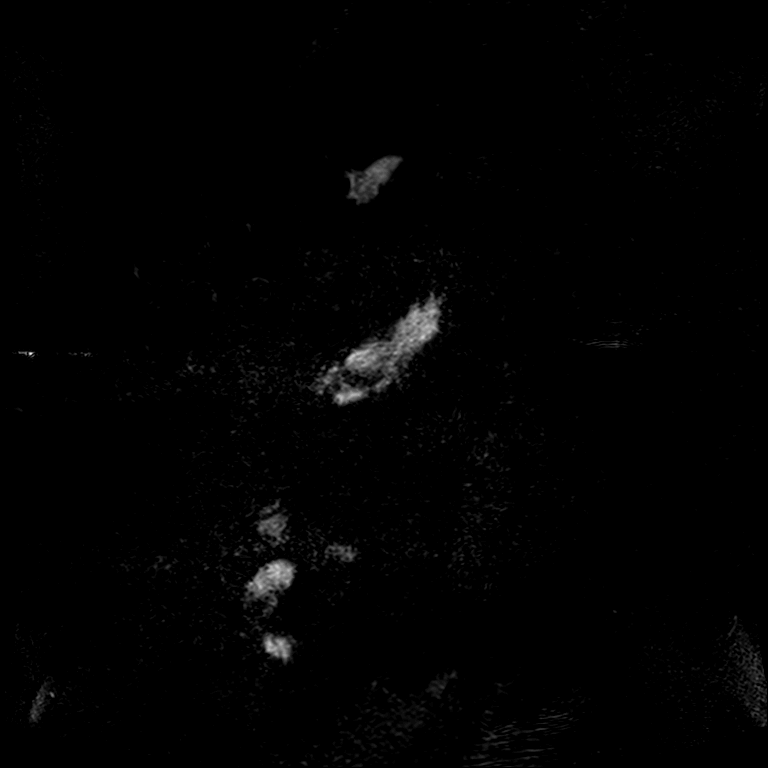
[im 36/72]
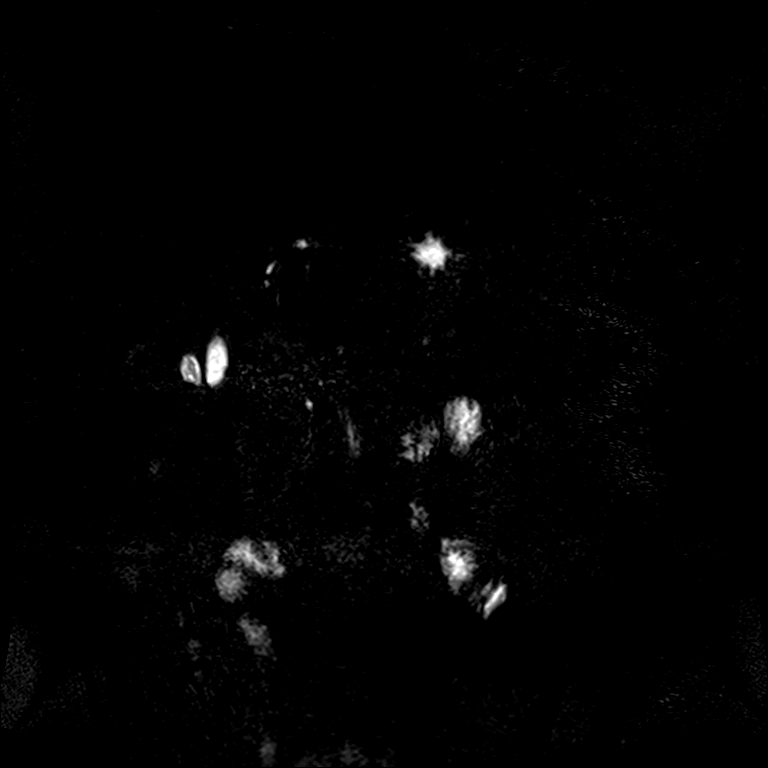
[im 72/72]
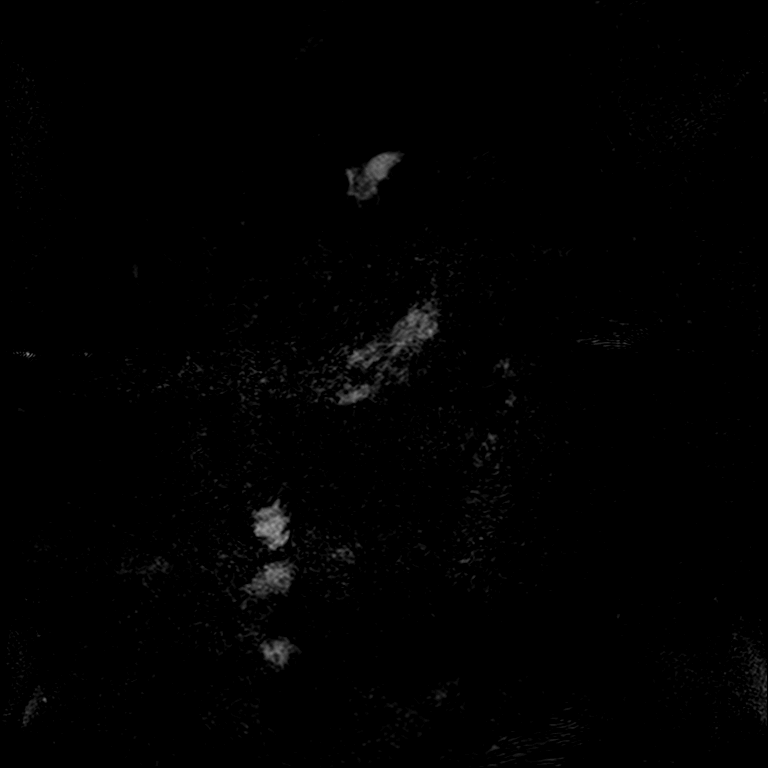

[Series 12: T2 · coronal · 6.0mm · 1.64mm/px · 1 of 41 slices shown (1 of 2)]
[im 1/41]
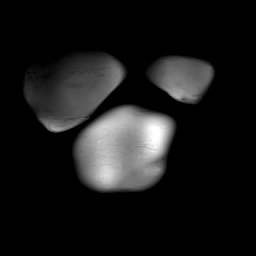

[Series 14: T1 · axial · 3.5mm · 1.25mm/px · z∈[-106,+143]mm · 3 of 72 slices shown (1 of 2)]
[im 1/72]
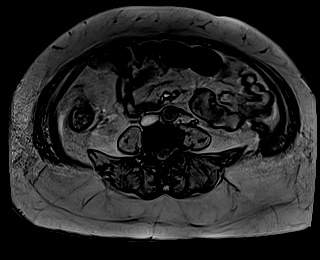
[im 36/72]
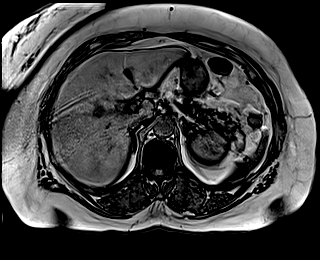
[im 72/72]
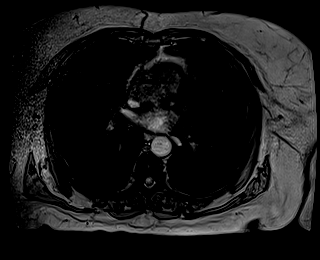

[Series 15: T1 · axial · 3.5mm · 1.25mm/px · z∈[-106,+143]mm · 3 of 72 slices shown (2 of 2)]
[im 1/72]
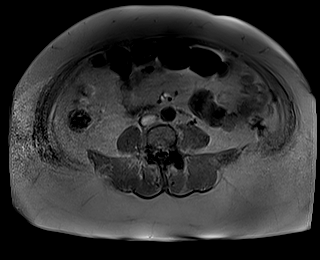
[im 36/72]
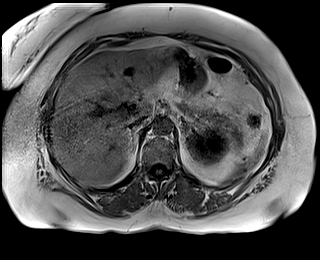
[im 72/72]
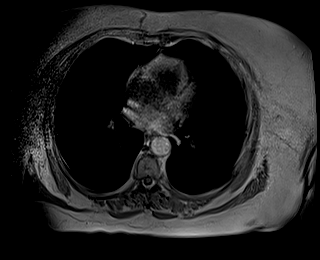

[Series 16: cor obl thk · sagittal · 50.0mm · 0.78mm/px · 1 of 9 slices shown]
[im 1/9]
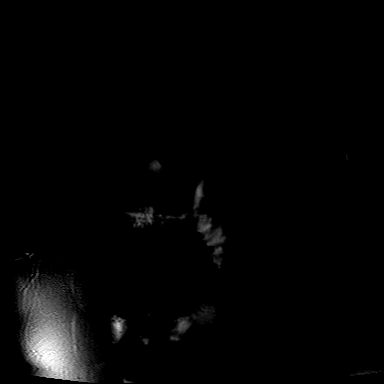

[Series 17: T2 · axial · 6.0mm · 1.64mm/px · 1 of 40 slices shown (2 of 2)]
[im 1/40]
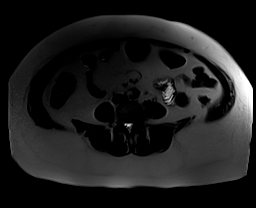

[Series 19: T1 dynamic · axial · 3.0mm · 1.31mm/px · z∈[-104,+157]mm · 3 of 88 slices shown (1 of 7)]
[im 1/88]
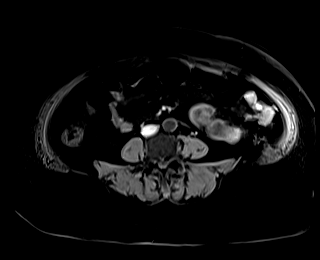
[im 44/88]
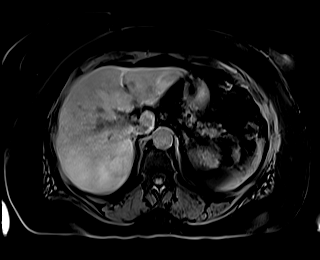
[im 88/88]
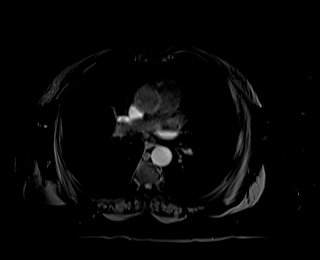

[Series 23: T1 dynamic · axial · 3.0mm · 1.31mm/px · z∈[-104,+157]mm · 3 of 88 slices shown (2 of 7)]
[im 1/88]
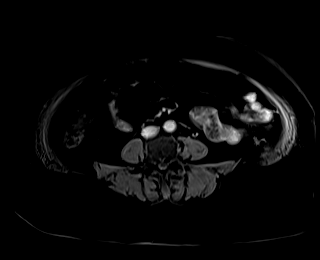
[im 44/88]
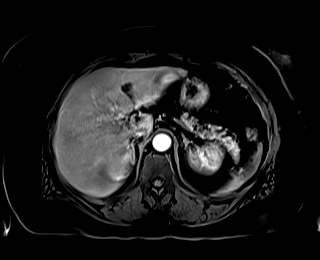
[im 88/88]
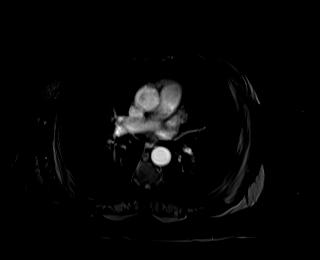

[Series 24: T1 dynamic · axial · 3.0mm · 1.31mm/px · z∈[-104,+157]mm · 3 of 88 slices shown (3 of 7)]
[im 1/88]
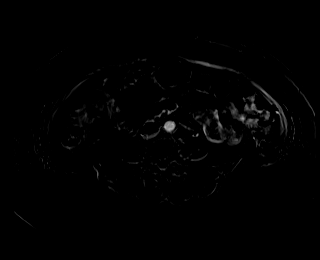
[im 44/88]
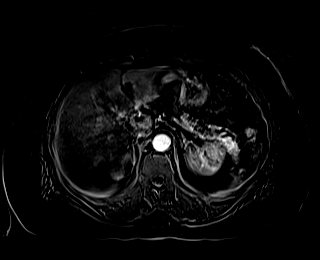
[im 88/88]
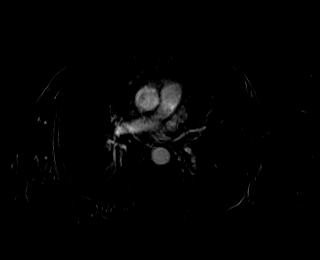

[Series 27: T1 dynamic · axial · 3.0mm · 1.31mm/px · z∈[-104,+157]mm · 3 of 88 slices shown (4 of 7)]
[im 1/88]
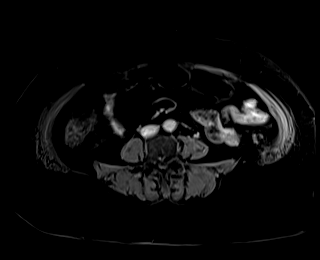
[im 44/88]
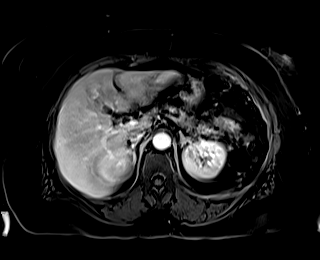
[im 88/88]
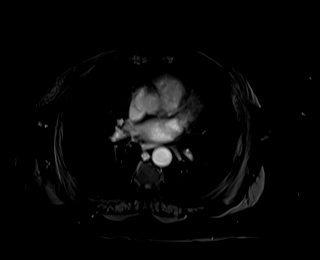

[Series 28: T1 dynamic · axial · 3.0mm · 1.31mm/px · z∈[-104,+157]mm · 3 of 88 slices shown (5 of 7)]
[im 1/88]
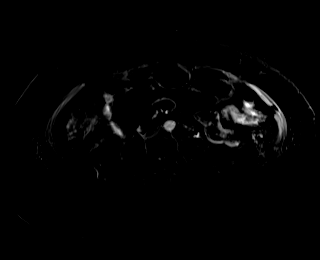
[im 44/88]
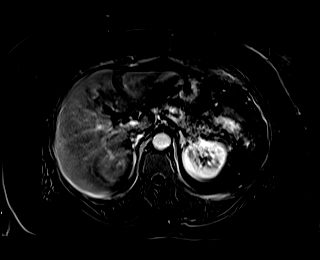
[im 88/88]
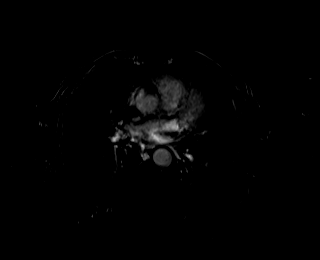

[Series 31: T1 dynamic · axial · 3.0mm · 1.31mm/px · z∈[-104,+157]mm · 3 of 88 slices shown (6 of 7)]
[im 1/88]
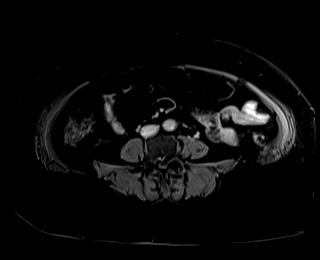
[im 44/88]
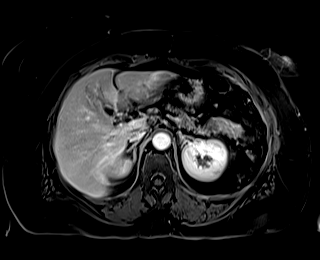
[im 88/88]
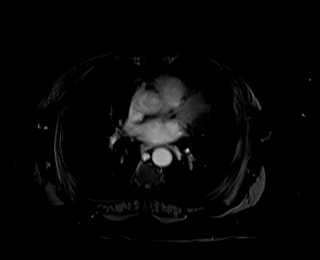

[Series 32: T1 dynamic · axial · 3.0mm · 1.31mm/px · z∈[-104,+25]mm · 2 of 88 slices shown (7 of 7)]
[im 1/88]
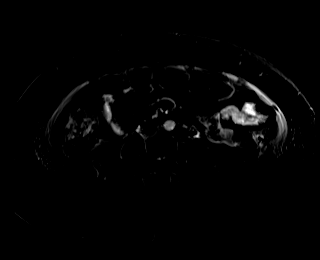
[im 44/88]
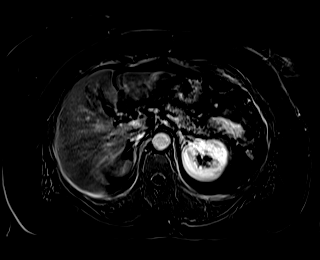

[37 of 48 positions shown; findings below may reference images not displayed]

FINDINGS: Lower chest: No acute findings.  Small hiatal hernia.

Hepatobiliary: Benign simple cyst of the left lobe of the liver
(series 17, image 21). No mass or other parenchymal abnormality
identified. Numerous gallstones in the gallbladder, and several
small calculi in the gallbladder neck as well as within the proximal
cystic duct measuring no greater than 0.3 cm (series 8, image 47).
Low insertion of the cystic duct on the common bile duct. The common
bile duct measures up to 0.8 cm in caliber. No intrahepatic biliary
ductal dilatation.

Pancreas: No mass, inflammatory changes, or other parenchymal
abnormality identified.No pancreatic ductal dilatation.

Spleen:  Within normal limits in size and appearance.

Adrenals/Urinary Tract: Stable, definitively benign fat containing
left adrenal adenoma (series 14, image 44). No renal masses or
suspicious contrast enhancement identified. No evidence of
hydronephrosis.

Stomach/Bowel: Visualized portions within the abdomen are
unremarkable.

Vascular/Lymphatic: No pathologically enlarged lymph nodes
identified. No abdominal aortic aneurysm demonstrated.

Other:  None.

Musculoskeletal: No suspicious osseous lesions identified.
IMPRESSION: 1. Numerous gallstones in the gallbladder, and several small calculi
in the gallbladder neck as well as within the proximal cystic duct
measuring no greater than 0.3 cm. No gallbladder wall thickening or
inflammatory findings.
2. The common bile duct measures up to 0.8 cm in caliber, near the
upper limit of normal in caliber, of uncertain significance, however
without common bile duct calculus or other obstructing lesion
identified to the ampulla.
3. Small hiatal hernia.

## 2021-09-22 MED ORDER — SODIUM CHLORIDE 0.9 % IV SOLN
2.0000 g | Freq: Once | INTRAVENOUS | Status: AC
  Administered 2021-09-22: 2 g via INTRAVENOUS
  Filled 2021-09-22: qty 20

## 2021-09-22 MED ORDER — HYDRALAZINE HCL 20 MG/ML IJ SOLN: 10.0000 mg | Freq: Three times a day (TID) | INTRAMUSCULAR | Status: DC | PRN

## 2021-09-22 MED ORDER — SODIUM CHLORIDE 0.9 % IV BOLUS
500.0000 mL | Freq: Once | INTRAVENOUS | Status: AC
  Administered 2021-09-22: 500 mL via INTRAVENOUS

## 2021-09-22 MED ORDER — GADOBUTROL 1 MMOL/ML IV SOLN
10.0000 mL | Freq: Once | INTRAVENOUS | Status: AC | PRN
  Administered 2021-09-22: 10 mL via INTRAVENOUS

## 2021-09-22 MED ORDER — PROCHLORPERAZINE EDISYLATE 10 MG/2ML IJ SOLN: 10.0000 mg | Freq: Four times a day (QID) | INTRAMUSCULAR | Status: DC | PRN

## 2021-09-22 MED ORDER — SODIUM CHLORIDE 0.9 % IV SOLN: INTRAVENOUS | Status: DC

## 2021-09-22 MED ORDER — ENOXAPARIN SODIUM 40 MG/0.4ML IJ SOSY
40.0000 mg | PREFILLED_SYRINGE | INTRAMUSCULAR | Status: DC
  Administered 2021-09-23 – 2021-09-24 (×3): 40 mg via SUBCUTANEOUS
  Filled 2021-09-22 (×4): qty 0.4

## 2021-09-22 MED ORDER — LORAZEPAM 2 MG/ML IJ SOLN
0.5000 mg | INTRAMUSCULAR | Status: AC
  Administered 2021-09-22: 0.5 mg via INTRAVENOUS
  Filled 2021-09-22: qty 1

## 2021-09-22 MED ORDER — HYDROMORPHONE HCL 1 MG/ML IJ SOLN
0.5000 mg | Freq: Once | INTRAMUSCULAR | Status: AC
  Administered 2021-09-22: 0.5 mg via INTRAVENOUS
  Filled 2021-09-22: qty 1

## 2021-09-22 MED ORDER — SODIUM CHLORIDE 0.9 % IV BOLUS
500.0000 mL | Freq: Once | INTRAVENOUS | Status: AC
Start: 2021-09-22 — End: 2021-09-22
  Administered 2021-09-22: 500 mL via INTRAVENOUS

## 2021-09-22 MED ORDER — POTASSIUM CHLORIDE 10 MEQ/100ML IV SOLN
10.0000 meq | INTRAVENOUS | Status: AC
  Administered 2021-09-22 (×3): 10 meq via INTRAVENOUS
  Filled 2021-09-22 (×4): qty 100

## 2021-09-22 MED ORDER — SODIUM CHLORIDE 0.9 % IV SOLN
2.0000 g | INTRAVENOUS | Status: DC
  Administered 2021-09-23 – 2021-09-24 (×2): 2 g via INTRAVENOUS
  Filled 2021-09-22 (×4): qty 20

## 2021-09-22 MED ORDER — ONDANSETRON HCL 4 MG/2ML IJ SOLN
4.0000 mg | Freq: Once | INTRAMUSCULAR | Status: AC
  Administered 2021-09-22: 4 mg via INTRAVENOUS
  Filled 2021-09-22: qty 2

## 2021-09-22 MED ORDER — HYDROMORPHONE HCL 1 MG/ML IJ SOLN
0.5000 mg | INTRAMUSCULAR | Status: DC | PRN
  Filled 2021-09-22: qty 1

## 2021-09-22 NOTE — ED Provider Notes (Signed)
[facility] DEPT Provider Note   CSN: [id number] Arrival date & time: 09/22/21  0702     History  Chief Complaint  Patient presents with   Abdominal Pain    Cassandra Wilkinson is a 61 y.o. female.  61 yo female with complaint of epigastric to RUQ pain x 5 days, intermittent initially, now constant, radiates into her right back. Vomiting x 2 days, twice daily, non bloody. Reports urinary frequency (without dysuria) and loose stools (but states not diarrhea). No blood in urine or stool. Pain is better with staying still and lying on left side, also taking Tylenol. Pain/vomiting worse with eating, also worse with movement. Reports chills since Monday, has not checked temp.  History of hypertension. No similar symptoms previously. Seen by PCP yesterday who thinks possibly gall stones. Prior abdominal surgeries include appendectomy, c-section, tubaligation.       Home Medications Prior to Admission medications   Medication Sig Start Date End Date Taking? Authorizing Provider  amLODipine (NORVASC) 10 MG tablet Take 10 mg by mouth daily. 12/18/20   [provider]  amoxicillin (AMOXIL) 500 MG capsule Take 1,000 mg by mouth 2 (two) times daily. 02/28/21   [provider]  Aspirin-Salicylamide-Caffeine (BC HEADACHE POWDER PO) Take 1 Package by mouth every 4 (four) hours as needed (for pain).    [provider]  cetirizine (ZYRTEC) 10 MG tablet Take 1 tablet (10 mg total) by mouth daily. Patient not taking: No sig reported 07/25/16   Gloriann Loan, PA-C  erythromycin ophthalmic ointment Place a 1/2 inch ribbon of ointment into the lower eyelid 4 times daily for 5 days. Patient not taking: No sig reported 07/25/16   Gloriann Loan, PA-C  fluticasone [facility]) 50 MCG/ACT nasal spray Place 2 sprays into both nostrils daily. Patient not taking: No sig reported 05/14/20   Nuala Alpha A, PA-C  lisinopril-hydrochlorothiazide (ZESTORETIC) 20-12.5 MG  tablet Take 1 tablet by mouth daily. 06/29/17   Orlie Dakin, MD      Allergies    Codeine and Food    Review of Systems   Review of Systems Negative except as per HPI Physical Exam Updated Vital Signs BP 131/80    Pulse (!) 57    Temp 98.1 F (36.7 C) (Oral)    Resp 18    LMP 06/24/2012    SpO2 100%  Physical Exam Vitals and nursing note reviewed.  Constitutional:      General: She is not in acute distress.    Appearance: She is well-developed. She is not diaphoretic.  HENT:     Head: Normocephalic and atraumatic.  Cardiovascular:     Rate and Rhythm: Normal rate and regular rhythm.     Heart sounds: Normal heart sounds.  Pulmonary:     Effort: Pulmonary effort is normal.     Breath sounds: Normal breath sounds.  Abdominal:     Palpations: Abdomen is soft.     Tenderness: There is generalized abdominal tenderness and tenderness in the right upper quadrant. There is guarding. Positive signs include Alegra Rost's sign.  Musculoskeletal:     Right lower leg: No edema.     Left lower leg: No edema.  Skin:    General: Skin is warm and dry.     Findings: No erythema or rash.  Neurological:     Mental Status: She is alert and oriented to person, place, and time.  Psychiatric:        Behavior: Behavior normal.    ED  Results / Procedures / Treatments   Labs (all labs ordered are listed, but only abnormal results are displayed) Labs Reviewed  COMPREHENSIVE METABOLIC PANEL - Abnormal; Notable for the following components:      Result Value   Potassium 3.2 (*)    Glucose, Bld 112 (*)    AST 332 (*)    ALT 353 (*)    Alkaline Phosphatase 170 (*)    Total Bilirubin 4.4 (*)    All other components within normal limits  LIPASE, BLOOD - Abnormal; Notable for the following components:   Lipase 2,483 (*)    All other components within normal limits  RESP PANEL BY RT-PCR (FLU A&B, COVID) ARPGX2  CBC WITH DIFFERENTIAL/PLATELET  URINALYSIS, ROUTINE W REFLEX MICROSCOPIC     EKG None  Radiology US Abdomen Limited RUQ (LIVER/GB)  Result Date: 09/22/2021 CLINICAL DATA:  61 year old female with right upper quadrant abdominal pain. EXAM: ULTRASOUND ABDOMEN LIMITED RIGHT UPPER QUADRANT COMPARISON:  CTA chest 03/22/2009. FINDINGS: Gallbladder: Wall-echo-shadow sign (WES sign) indicating a gallbladder contracted around numerous stones (image 4). Positive sonographic Pernie Grosso sign elicited. No pericholecystic fluid is evident. Estimated individual gallstones size up to 19 mm. Common bile duct: Diameter: 10 mm, dilated. Liver: Questionable central, but no peripheral intrahepatic biliary ductal dilatation identified. No discrete liver lesion. Background liver echogenicity within normal limits. Portal vein is patent on color Doppler imaging with normal direction of blood flow towards the liver. Other: Negative visible right kidney. IMPRESSION: Gallbladder contracted around numerous stones with dilated CBD and positive sonographic Rylon Poitra sign suspicious for Choledocholithiasis +/- Acute Cholecystitis. Electronically Signed   By: Genevie Ann M.D.   On: 09/22/2021 09:39    Procedures Procedures    Medications Ordered in ED Medications  prochlorperazine (COMPAZINE) injection 10 mg (has no administration in time range)  HYDROmorphone (DILAUDID) injection 0.5 mg (has no administration in time range)  LORazepam (ATIVAN) injection 0.5 mg (has no administration in time range)  sodium chloride 0.9 % bolus 500 mL (0 mLs Intravenous Stopped 09/22/21 1000)  ondansetron (ZOFRAN) injection 4 mg (4 mg Intravenous Given 09/22/21 0905)  HYDROmorphone (DILAUDID) injection 0.5 mg (0.5 mg Intravenous Given 09/22/21 0905)  cefTRIAXone (ROCEPHIN) 2 g in sodium chloride 0.9 % 100 mL IVPB (2 g Intravenous New Bag/Given 09/22/21 1049)  sodium chloride 0.9 % bolus 500 mL (500 mLs Intravenous New Bag/Given 09/22/21 1049)    ED Course/ Medical Decision Making/ A&P                           Medical Decision  Making Amount and/or Complexity of Data Reviewed Labs: ordered. Radiology: ordered.  Risk Prescription drug management. Decision regarding hospitalization.   This patient presents to the ED for concern of right upper quadrant abdominal pain with nausea and vomiting, this involves an extensive number of treatment options, and is a complaint that carries with it a high risk of complications and morbidity.  The differential diagnosis includes but not limited to cholelithiasis, acute cholecystitis, choledocholithiasis, cholangitis, gallstone pancreatitis, pancreatitis, peptic ulcer disease gastritis   Co morbidities that complicate the patient evaluation  HTN   Additional history obtained:  External records from outside source obtained and reviewed including no recent relevant records for review   Lab Tests:  I Ordered, and personally interpreted labs.  The pertinent results include: CBC with normal white blood cell count.  Lipase is elevated at 2483.  CMP with elevated AST at 332, ALT  353, alk phos 170 and bilirubin 4.4.  Normal renal function.  COVID/flu negative.   Imaging Studies ordered:  I ordered imaging studies including [place] RUQ  I independently visualized and interpreted imaging which showed enlarged CBD  I agree with the radiologist interpretation-gallbladder contracted around numerous stones with dilated CBD and positive Zelda Reames sign suspicious for acute cholecystitis   Cardiac Monitoring:  The patient was maintained on a cardiac monitor.  I personally viewed and interpreted the cardiac monitored which showed an underlying rhythm of: sinus rhythm    Medicines ordered and prescription drug management:  I ordered medication including dilaudid, zofran, IVF  for pain  Reevaluation of the patient after these medicines showed that the patient improved, declines further pain meds at this time.  I have reviewed the patients home medicines and have made adjustments as  needed  Consultations Obtained:  I requested consultation with GI Dr. Carlean Purl (secure chat consult request, will see the patient), general surgery- PA-C Claiborne Billings, does not need surgery at this time but team will round on patient.  Discussed with Dr. Marylyn Ishihara with [facility] hospitalist service who will consult for admission.   Problem List / ED Course:  61 year old female with complaint of right upper quadrant abdominal pain with nausea and vomiting as above.  Found to have generalized abdominal tenderness, worse in the right upper quadrant with positive Glady Ouderkirk sign.  Ultrasound with gallstones with enlarged common bile duct concerning for acute cholecystitis.  Patient is afebrile, normal white blood cell count.  It does have elevated AST, ALT, alk phos and bilirubin and lipase.  Concern for gallstone pancreatitis.  Discussed with Dr. Zenia Resides, ER attending.  Pain is improved with IV pain medications we will add antibiotics.  Plan is to admit to the hospital with consult with surgery and GI. Patient updated with plan of care, she is NPO.          Final Clinical Impression(s) / ED Diagnoses Final diagnoses:  Gallstone pancreatitis    Rx / DC Orders ED Discharge Orders     None         Tacy Learn, PA-C 09/22/21 1320    Lacretia Leigh, MD 09/23/21 1230

## 2021-09-22 NOTE — H&P (Signed)
?History and Physical  ? ? ?PatientLiann Wilkinson XKG:[id number] DOB: 1961-01-02 ?DOA: 09/22/2021 ?DOS: the patient was seen and examined on 09/22/2021 ?PCP: [facility].  ?Patient coming from: Home ? ?Chief Complaint:  ?Chief Complaint  ?Patient presents with  ? Abdominal Pain  ? ? ?HPI: Cassandra Wilkinson is a 61 y.o. female with medical history significant of HTN. Presenting with abdominal pain. Symptoms started 4 days ago. She has crampy pain in the upper quadrants of her stomach. She thought it was gas pain, so she tried some APAP and pedialyte. This did not help. She developed N/V. Yesterday she decided to go to the PCP. They discussed the possibility of gallstones. She was told if her symptoms worsened go to the ED. Last night, her pain became more intense. So, she came to the ED for evaluation. She denies any other aggravating or alleviating factors.   ? ?Review of Systems: As mentioned in the history of present illness. All other systems reviewed and are negative. ?Past Medical History:  ?Diagnosis Date  ? Hypertension   ? ?Past Surgical History:  ?Procedure Laterality Date  ? APPENDECTOMY    ? CESAREAN SECTION    ? TUBAL LIGATION    ? ?Social History:  reports that she has been smoking cigarettes. She has been smoking an average of .15 packs per day. She has never used smokeless tobacco. She reports that she does not drink alcohol and does not use drugs. ? ?Allergies  ?Allergen Reactions  ? Codeine Swelling  ? Food Swelling  ?  Kiwi and Codeine cause swelling of hands and mouth.  ? ? ?Family History  ?Problem Relation Age of Onset  ? Heart failure Mother   ? Diabetes Mother   ? Cancer Father   ? ? ?Prior to Admission medications   ?Medication Sig Start Date End Date Taking? Authorizing Provider  ?amLODipine (NORVASC) 10 MG tablet Take 10 mg by mouth daily. 12/18/20   [provider]  ?amoxicillin (AMOXIL) 500 MG capsule Take 1,000 mg by mouth 2 (two) times daily. 02/28/21   [provider]  ?Aspirin-Salicylamide-Caffeine (BC HEADACHE POWDER PO) Take 1 Package by mouth every 4 (four) hours as needed (for pain).    [provider]  ?cetirizine (ZYRTEC) 10 MG tablet Take 1 tablet (10 mg total) by mouth daily. ?Patient not taking: No sig reported 07/25/16   Gloriann Loan, PA-C  ?erythromycin ophthalmic ointment Place a 1/2 inch ribbon of ointment into the lower eyelid 4 times daily for 5 days. ?Patient not taking: No sig reported 07/25/16   Gloriann Loan, PA-C  ?fluticasone (FLONASE) 50 MCG/ACT nasal spray Place 2 sprays into both nostrils daily. ?Patient not taking: No sig reported 05/14/20   Nuala Alpha A, PA-C  ?lisinopril-hydrochlorothiazide (ZESTORETIC) 20-12.5 MG tablet Take 1 tablet by mouth daily. 06/29/17   Orlie Dakin, MD  ? ? ?Physical Exam: ?Vitals:  ? 09/22/21 0709 09/22/21 1037  ?BP: 120/83 131/80  ?Pulse: 97 (!) 57  ?Resp: 18 18  ?Temp: 98.1 ?F (36.7 ?C)   ?TempSrc: Oral   ?SpO2: 100% 100%  ? ?General: 61 y.o. female resting in bed in NAD ?Eyes: PERRL, normal sclera ?ENMT: Nares patent w/o discharge, orophaynx clear, dentition normal, ears w/o discharge/lesions/ulcers ?Neck: Supple, trachea midline ?Cardiovascular: RRR, +S1, S2, no m/g/r, equal pulses throughout ?Respiratory: CTABL, no w/r/r, normal WOB ?GI: BS+, ND, mild RUQ TTP, no masses noted, no organomegaly noted ?MSK: No e/c/c ?Neuro: A&O x 3, no focal deficits ?Psyc:  Appropriate interaction and affect, calm/cooperative ? ?Data Reviewed: ? ?K+: 3.2 ?Alk phos: 170 ?Lipase: 2483 ?AST: 332 ?ALT: 353 ?T bili 4.4 ? ?[place] RUQ ab: Gallbladder contracted around numerous stones with dilated CBD and positive sonographic Murphy sign suspicious for Choledocholithiasis +/- Acute Cholecystitis. ? ?Assessment and Plan: ?No notes have been filed under this hospital service. ?Service: Hospitalist ?Gallstone pancreatitis ?    - admit to inpt, progressive ?    - check MRCP ?    - GI consulted, appreciate assistance ?    - NPO for  now ?    - anti-emetics, pain control ?    - continue abx ? ?Acute cholecystitis ?    - [facility]CS consulted, appreciate assistance ?    - pancreatitis will need to be resolved prior to cholecystectomy ? ?HTN ?    - continue home regimen when confirmed ?    - PRNs available ? ?Hypokalemia ?    - replace K+, check Mg2+ ? ?Advance Care Planning:   Code Status: FULL ? ?Consults: CCS, GI (Dr. Benson Norway) ? ?Family Communication: w/ son at bedside ? ?Severity of Illness: ?The appropriate patient status for this patient is INPATIENT. Inpatient status is judged to be reasonable and necessary in order to provide the required intensity of service to ensure the patient's safety. The patient's presenting symptoms, physical exam findings, and initial radiographic and laboratory data in the context of their chronic comorbidities is felt to place them at high risk for further clinical deterioration. Furthermore, it is not anticipated that the patient will be medically stable for discharge from the hospital within 2 midnights of admission.  ? ?* I certify that at the point of admission it is my clinical judgment that the patient will require inpatient hospital care spanning beyond 2 midnights from the point of admission due to high intensity of service, high risk for further deterioration and high frequency of surveillance required.* ? ?Author: ?Jonnie Finner, DO ?09/22/2021 11:31 AM ? ?For on call review [email or web address].  ?

## 2021-09-22 NOTE — ED Triage Notes (Addendum)
Pt arrived via POV, c/o upper middle and right sided abd pain, and n/v. Denies any diarrhea, but states she is having BM and urinating more frequently. States she was seen yesterday by primary, told possible gallstones.  ?

## 2021-09-22 NOTE — Consult Note (Signed)
[facility] Surgery Consult Note  Cassandra Wilkinson July 29, 1960  [id number].    Requesting MD: Lacretia Leigh Chief Complaint/Reason for Consult: gallstone pancreatitis, choledocholithiasis  HPI:  Cassandra Wilkinson is a 61yo female PMH HTN who presented to [facility] today complaining of 5 days of abdominal pain. States that the pain is in her RUQ and radiates into her back. Initially the pain was intermittent but has become more constant. Associated with nausea and vomiting. Symptoms are worse with PO intake. She also reports some chills, no known fever. States that she has never had symptoms like this before.  She does admit to dark urine.  She went to see her PCP yesterday who was concerned about gallstones.  I don't see this visit in care everywhere so not sure what all the PCP did.  Unfortunately her pain became unbearable last night so she presented to the ED today.  In the ED the patient was found to be hemodynamically stable. Lab work pertinent for WBC 5, elevated LFTs with AST 332, ALT 353, Alk phos 170, Tbili 4.4, and elevated lipase 2483. She underwent abdominal u/s which shows a contracted gallbladder with numerous stones, dilated CBD suspicious for choledocholithiasis, No pericholecystic, and a positive sonographic Murphy. Patient is being admitted to the medical service. General surgery asked to see.  Abdominal surgical history: appendectomy, c section, tubal ligation Anticoagulants: none Daily smoker, 5-6 cigs a day Denies alcohol or illicit drug use Employment: first grade teacher in [place]  Review of Systems  Constitutional:  Positive for chills. Negative for fever.  Gastrointestinal:  Positive for abdominal pain, nausea and vomiting.   All systems reviewed and otherwise negative except for as above  Family History  Problem Relation Age of Onset   Heart failure Mother    Diabetes Mother    Cancer Father     Past Medical History:  Diagnosis Date   Hypertension      Past Surgical History:  Procedure Laterality Date   APPENDECTOMY     CESAREAN SECTION     TUBAL LIGATION      Social History:  reports that she has been smoking cigarettes. She has been smoking an average of .15 packs per day. She has never used smokeless tobacco. She reports that she does not drink alcohol and does not use drugs.  Allergies:  Allergies  Allergen Reactions   Codeine Swelling   Food Swelling    Kiwi and Codeine cause swelling of hands and mouth.    (Not in a hospital admission)   Prior to Admission medications   Medication Sig Start Date End Date Taking? Authorizing Provider  amLODipine (NORVASC) 10 MG tablet Take 10 mg by mouth daily. 12/18/20   [provider]  amoxicillin (AMOXIL) 500 MG capsule Take 1,000 mg by mouth 2 (two) times daily. 02/28/21   [provider]  Aspirin-Salicylamide-Caffeine (BC HEADACHE POWDER PO) Take 1 Package by mouth every 4 (four) hours as needed (for pain).    [provider]  cetirizine (ZYRTEC) 10 MG tablet Take 1 tablet (10 mg total) by mouth daily. Patient not taking: No sig reported 07/25/16   Gloriann Loan, PA-C  erythromycin ophthalmic ointment Place a 1/2 inch ribbon of ointment into the lower eyelid 4 times daily for 5 days. Patient not taking: No sig reported 07/25/16   Gloriann Loan, PA-C  fluticasone [facility]) 50 MCG/ACT nasal spray Place 2 sprays into both nostrils daily. Patient not taking: No sig reported 05/14/20   Deliah Boston, PA-C  lisinopril-hydrochlorothiazide (ZESTORETIC) 20-12.5 MG tablet Take 1 tablet by mouth daily. 06/29/17   Orlie Dakin, MD    Blood pressure 131/80, pulse (!) 57, temperature 98.1 F (36.7 C), temperature source Oral, resp. rate 18, last menstrual period 06/24/2012, SpO2 100 %. Physical Exam: General: pleasant, WD/WN female who is laying in bed in NAD HEENT: head is normocephalic, atraumatic.  Sclera are noninjected, but slightly icteric.  Pupils equal and  round.  Ears and nose without any masses or lesions.  Mouth is pink and moist. Heart: regular, rate, and rhythm.  Normal s1,s2. No obvious murmurs, gallops, or rubs noted.  Palpable pedal pulses bilaterally  Lungs: CTAB, no wheezes, rhonchi, or rales noted.  Respiratory effort nonlabored Abd: soft, mild tenderness in epigastrium and RUQ (just had pain meds so states it's not very bad) ND, +BS, no masses, hernias, or organomegaly.  MS: no BUE/BLE edema, calves soft and nontender Skin: warm and dry with no masses, lesions, or rashes Psych: A&Ox4 with an appropriate affect Neuro: cranial nerves grossly intact  Results for orders placed or performed during the hospital encounter of 09/22/21 (from the past 48 hour(s))  CBC with Differential     Status: None   Collection Time: 09/22/21  7:18 AM  Result Value Ref Range   WBC 5.0 4.0 - 10.5 K/uL   RBC 4.59 3.87 - 5.11 MIL/uL   Hemoglobin 14.7 12.0 - 15.0 g/dL   HCT 44.4 36.0 - 46.0 %   MCV 96.7 80.0 - 100.0 fL   MCH 32.0 26.0 - 34.0 pg   MCHC 33.1 30.0 - 36.0 g/dL   RDW 14.9 11.5 - 15.5 %   Platelets 273 150 - 400 K/uL   nRBC 0.0 0.0 - 0.2 %   Neutrophils Relative % 75 %   Neutro Abs 3.8 1.7 - 7.7 K/uL   Lymphocytes Relative 16 %   Lymphs Abs 0.8 0.7 - 4.0 K/uL   Monocytes Relative 9 %   Monocytes Absolute 0.4 0.1 - 1.0 K/uL   Eosinophils Relative 0 %   Eosinophils Absolute 0.0 0.0 - 0.5 K/uL   Basophils Relative 0 %   Basophils Absolute 0.0 0.0 - 0.1 K/uL   Immature Granulocytes 0 %   Abs Immature Granulocytes 0.02 0.00 - 0.07 K/uL    Comment: Performed at [facility], [place] [address], [place], [place] [address]  Comprehensive metabolic panel     Status: Abnormal   Collection Time: 09/22/21  7:18 AM  Result Value Ref Range   Sodium 137 135 - 145 mmol/L   Potassium 3.2 (L) 3.5 - 5.1 mmol/L   Chloride 102 98 - 111 mmol/L   CO2 26 22 - 32 mmol/L   Glucose, Bld 112 (H) 70 - 99 mg/dL    Comment: Glucose reference  range applies only to samples taken after fasting for at least 8 hours.   BUN 11 6 - 20 mg/dL   Creatinine, Ser 0.86 0.44 - 1.00 mg/dL   Calcium 9.0 8.9 - 10.3 mg/dL   Total Protein 7.8 6.5 - 8.1 g/dL   Albumin 4.0 3.5 - 5.0 g/dL   AST 332 (H) 15 - 41 U/L    Comment: RESULTS CONFIRMED BY MANUAL DILUTION   ALT 353 (H) 0 - 44 U/L   Alkaline Phosphatase 170 (H) 38 - 126 U/L   Total Bilirubin 4.4 (H) 0.3 - 1.2 mg/dL   GFR, Estimated >60 >60 mL/min    Comment: (NOTE) Calculated using the CKD-EPI Creatinine  Equation (2021)    Anion gap 9 5 - 15    Comment: Performed at [facility], [place] [address] [place], [place] [address]  Lipase, blood     Status: Abnormal   Collection Time: 09/22/21  7:18 AM  Result Value Ref Range   Lipase 2,483 (H) 11 - 51 U/L    Comment: RESULTS CONFIRMED BY MANUAL DILUTION Performed at [facility], [place] [address] [place], [place] [address]   Resp Panel by RT-PCR (Flu A&B, Covid) Nasopharyngeal Swab     Status: None   Collection Time: 09/22/21  9:29 AM   Specimen: Nasopharyngeal Swab; Nasopharyngeal(NP) swabs in vial transport medium  Result Value Ref Range   SARS Coronavirus 2 by RT PCR NEGATIVE NEGATIVE    Comment: (NOTE) SARS-CoV-2 target nucleic acids are NOT DETECTED.  The SARS-CoV-2 RNA is generally detectable in upper respiratory specimens during the acute phase of infection. The lowest concentration of SARS-CoV-2 viral copies this assay can detect is 138 copies/mL. A negative result does not preclude SARS-Cov-2 infection and should not be used as the sole basis for treatment or other patient management decisions. A negative result may occur with  improper specimen collection/handling, submission of specimen other than nasopharyngeal swab, presence of viral mutation(s) within the areas targeted by this assay, and inadequate number of viral copies(<138 copies/mL). A negative result must be combined  with clinical observations, patient history, and epidemiological information. The expected result is Negative.  Fact Sheet for Patients:  EntrepreneurPulse.com.au  Fact Sheet for Healthcare Providers:  IncredibleEmployment.be  This test is no t yet approved or cleared by the [place] FDA and  has been authorized for detection and/or diagnosis of SARS-CoV-2 by FDA under an Emergency Use Authorization (EUA). This EUA will remain  in effect (meaning this test can be used) for the duration of the COVID-19 declaration under Section 564(b)(1) of the Act, 21 U.S.C.section 360bbb-3(b)(1), unless the authorization is terminated  or revoked sooner.       Influenza A by PCR NEGATIVE NEGATIVE   Influenza B by PCR NEGATIVE NEGATIVE    Comment: (NOTE) The Xpert Xpress SARS-CoV-2/FLU/RSV plus assay is intended as an aid in the diagnosis of influenza from Nasopharyngeal swab specimens and should not be used as a sole basis for treatment. Nasal washings and aspirates are unacceptable for Xpert Xpress SARS-CoV-2/FLU/RSV testing.  Fact Sheet for Patients: EntrepreneurPulse.com.au  Fact Sheet for Healthcare Providers: IncredibleEmployment.be  This test is not yet approved or cleared by the [place] FDA and has been authorized for detection and/or diagnosis of SARS-CoV-2 by FDA under an Emergency Use Authorization (EUA). This EUA will remain in effect (meaning this test can be used) for the duration of the COVID-19 declaration under Section 564(b)(1) of the Act, 21 U.S.C. section 360bbb-3(b)(1), unless the authorization is terminated or revoked.  Performed at [facility], [place] [address] [place], [place] [address]    US Abdomen Limited RUQ (LIVER/GB)  Result Date: 09/22/2021 CLINICAL DATA:  61 year old female with right upper quadrant abdominal pain. EXAM: ULTRASOUND ABDOMEN LIMITED RIGHT  UPPER QUADRANT COMPARISON:  CTA chest 03/22/2009. FINDINGS: Gallbladder: Wall-echo-shadow sign (WES sign) indicating a gallbladder contracted around numerous stones (image 4). Positive sonographic Murphy sign elicited. No pericholecystic fluid is evident. Estimated individual gallstones size up to 19 mm. Common bile duct: Diameter: 10 mm, dilated. Liver: Questionable central, but no peripheral intrahepatic biliary ductal dilatation identified. No discrete liver lesion. Background liver echogenicity within  normal limits. Portal vein is patent on color Doppler imaging with normal direction of blood flow towards the liver. Other: Negative visible right kidney. IMPRESSION: Gallbladder contracted around numerous stones with dilated CBD and positive sonographic Murphy sign suspicious for Choledocholithiasis +/- Acute Cholecystitis. Electronically Signed   By: Genevie Ann M.D.   On: 09/22/2021 09:39    Anti-infectives (From admission, onward)    Start     Dose/Rate Route Frequency Ordered Stop   09/22/21 1030  cefTRIAXone (ROCEPHIN) 2 g in sodium chloride 0.9 % 100 mL IVPB        2 g 200 mL/hr over 30 Minutes Intravenous  Once 09/22/21 1026 09/22/21 1119        Assessment/Plan Biliary pancreatitis with Choledocholithiasis and possible acute cholecystitis - Patient with 5 days of RUQ abdominal pain, nausea, and vomiting. Lab work reveals elevated lipase at 2483 as well as elevated LFTs with Tbili of 4.4. Her u/s shows a contracted gallbladder with numerous stones, dilated CBD suspicious for choledocholithiasis, No pericholecystic fluid, and a positive sonographic Murphy. Recommend medical admission and supportive care for pancreatitis. Gastroenterology consult for probable choledocholithiasis and need for possible ERCP. U/s also questions acute cholecystitis but she does not have any pericholecystic fluid and her WBC is WNL. Ok to continue IV antibiotics if felt needed. We will follow along. Once pancreatitis  and choledocholithiasis resolve we will discuss laparoscopic cholecystectomy to prevent this from happening again. We will see PRN over the weekend.   ID - rocephin 3/3>> VTE - ok for chemical dvt ppx from surgical standpoint FEN - NPO Foley - none  HTN Hypokalemia  I reviewed ED provider notes, last 24 h vitals and pain scores, last 24 h labs and trends, and last 24 h imaging results  Moderate Medical Decision Making  Henreitta Cea, [place] [facility] 09/22/2021, 11:12 AM Please see Amion for pager number during day hours 7:00am-4:30pm

## 2021-09-22 NOTE — H&P (View-Only) (Signed)
? ?Referring Provider: EDP, Suella Broad, PA-C ?Primary Care Physician:  [place]. ?Primary Gastroenterologist:  Althia Forts ? ?Reason for Consultation:  Gallstone pancreatitis; CBD stones ? ?HPI: Cassandra Wilkinson is a 61 y.o. female with past medical history of hypertension who presented to [facility] emergency department today with complaints of abdominal pain for the past 4 days.  States the pain is in her right upper quadrant radiates to her back.  Initially was intermittent but has become more constant.  Associated with nausea and vomiting.  Symptoms are worse with p.o. intake.  Reports some chills, but no fever.  Has never had similar symptoms in the past.  He saw her PCP yesterday who was concerned about gallstones.  Unfortunately her pain became unbearable so she came to the emergency department. ? ?Lipase 2483.  AST 332, ALT 353, alk phos 170, total bili 4.4.  LFTs were normal when last checked 6 months ago.  CBC is normal.  She has received a dose of Rocephin. ? ?IMPRESSION: ?Gallbladder contracted around numerous stones with dilated CBD and ?positive sonographic Murphy sign suspicious for Choledocholithiasis ?+/- Acute Cholecystitis. ? ?MRCP has been ordered. ? ?Past Medical History:  ?Diagnosis Date  ? Hypertension   ? ? ?Past Surgical History:  ?Procedure Laterality Date  ? APPENDECTOMY    ? CESAREAN SECTION    ? TUBAL LIGATION    ? ? ?Prior to Admission medications   ?Medication Sig Start Date End Date Taking? Authorizing Provider  ?amLODipine (NORVASC) 10 MG tablet Take 10 mg by mouth daily. 12/18/20   [provider]  ?amoxicillin (AMOXIL) 500 MG capsule Take 1,000 mg by mouth 2 (two) times daily. 02/28/21   [provider]  ?Aspirin-Salicylamide-Caffeine (BC HEADACHE POWDER PO) Take 1 Package by mouth every 4 (four) hours as needed (for pain).    [provider]  ?cetirizine (ZYRTEC) 10 MG tablet Take 1 tablet (10 mg total) by mouth daily. ?Patient not  taking: No sig reported 07/25/16   Gloriann Loan, PA-C  ?erythromycin ophthalmic ointment Place a 1/2 inch ribbon of ointment into the lower eyelid 4 times daily for 5 days. ?Patient not taking: No sig reported 07/25/16   Gloriann Loan, PA-C  ?fluticasone (FLONASE) 50 MCG/ACT nasal spray Place 2 sprays into both nostrils daily. ?Patient not taking: No sig reported 05/14/20   Nuala Alpha A, PA-C  ?lisinopril-hydrochlorothiazide (ZESTORETIC) 20-12.5 MG tablet Take 1 tablet by mouth daily. 06/29/17   Orlie Dakin, MD  ? ? ?Current Facility-Administered Medications  ?Medication Dose Route Frequency Provider Last Rate Last Admin  ? HYDROmorphone (DILAUDID) injection 0.5 mg  0.5 mg Intravenous Q4H PRN Marylyn Ishihara, Tyrone A, DO      ? prochlorperazine (COMPAZINE) injection 10 mg  10 mg Intravenous Q6H PRN Marylyn Ishihara, Tyrone A, DO      ? ?Current Outpatient Medications  ?Medication Sig Dispense Refill  ? amLODipine (NORVASC) 10 MG tablet Take 10 mg by mouth daily.    ? amoxicillin (AMOXIL) 500 MG capsule Take 1,000 mg by mouth 2 (two) times daily.    ? Aspirin-Salicylamide-Caffeine (BC HEADACHE POWDER PO) Take 1 Package by mouth every 4 (four) hours as needed (for pain).    ? cetirizine (ZYRTEC) 10 MG tablet Take 1 tablet (10 mg total) by mouth daily. (Patient not taking: No sig reported) 30 tablet 0  ? erythromycin ophthalmic ointment Place a 1/2 inch ribbon of ointment into the lower eyelid 4 times daily for 5 days. (Patient not taking: No sig  reported) 3.5 g 0  ? fluticasone (FLONASE) 50 MCG/ACT nasal spray Place 2 sprays into both nostrils daily. (Patient not taking: No sig reported) 9.9 mL 0  ? lisinopril-hydrochlorothiazide (ZESTORETIC) 20-12.5 MG tablet Take 1 tablet by mouth daily. 30 tablet 0  ? ? ?Allergies as of 09/22/2021 - Review Complete 09/22/2021  ?Allergen Reaction Noted  ? Codeine Swelling 10/06/2011  ? Food Swelling 10/06/2011  ? ? ?Family History  ?Problem Relation Age of Onset  ? Heart failure Mother   ? Diabetes  Mother   ? Cancer Father   ? ? ?Social History  ? ?Socioeconomic History  ? Marital status: Divorced  ?  Spouse name: Not on file  ? Number of children: Not on file  ? Years of education: Not on file  ? Highest education level: Not on file  ?Occupational History  ? Not on file  ?Tobacco Use  ? Smoking status: Every Day  ?  Packs/day: 0.15  ?  Types: Cigarettes  ? Smokeless tobacco: Never  ?Vaping Use  ? Vaping Use: Never used  ?Substance and Sexual Activity  ? Alcohol use: No  ? Drug use: No  ? Sexual activity: Never  ?  Birth control/protection: None  ?Other Topics Concern  ? Not on file  ?Social History Narrative  ? Not on file  ? ?Social Determinants of Health  ? ?Financial Resource Strain: Not on file  ?Food Insecurity: Not on file  ?Transportation Needs: Not on file  ?Physical Activity: Not on file  ?Stress: Not on file  ?Social Connections: Not on file  ?Intimate Partner Violence: Not on file  ? ? ?Review of Systems: ?ROS is O/W negative except as mentioned in HPI. ? ?Physical Exam: ?Vital signs in last 24 hours: ?Temp:  [98.1 ?F (36.7 ?C)] 98.1 ?F (36.7 ?C) (03/03 6578) ?Pulse Rate:  [57-97] 57 (03/03 1037) ?Resp:  [18] 18 (03/03 1037) ?BP: (120-131)/(80-83) 131/80 (03/03 1037) ?SpO2:  [100 %] 100 % (03/03 1037) ?  ?General:  Alert, Well-developed, well-nourished, pleasant and cooperative in NAD ?Head:  Normocephalic and atraumatic. ?Eyes:  Sclera clear, no icterus.  Conjunctiva pink. ?Ears:  Normal auditory acuity. ?Mouth:  No deformity or lesions.   ?Lungs:  Clear throughout to auscultation.  No wheezes, crackles, or rhonchi.  ?Heart:  Regular rate and rhythm; no murmurs, clicks, rubs, or gallops. ?Abdomen:  Soft, no-distended.  BS present.  Mild upper abdominal TTP. ?Msk:  Symmetrical without gross deformities.  ?Pulses:  Normal pulses noted. ?Extremities:  Without clubbing or edema. ?Neurologic:  Alert and oriented x 4;  grossly normal neurologically. ?Skin:  Intact without significant lesions or  rashes. ?Psych:  Alert and cooperative. Normal mood and affect. ? ?Lab Results: ?Recent Labs  ?  09/22/21 ?0718  ?WBC 5.0  ?HGB 14.7  ?HCT 44.4  ?PLT 273  ? ?BMET ?Recent Labs  ?  09/22/21 ?0718  ?NA 137  ?K 3.2*  ?CL 102  ?CO2 26  ?GLUCOSE 112*  ?BUN 11  ?CREATININE 0.86  ?CALCIUM 9.0  ? ?LFT ?Recent Labs  ?  09/22/21 ?0718  ?PROT 7.8  ?ALBUMIN 4.0  ?AST 332*  ?ALT 353*  ?ALKPHOS 170*  ?BILITOT 4.4*  ? ? ?Studies/Results: ?US Abdomen Limited RUQ (LIVER/GB) ? ?Result Date: 09/22/2021 ?CLINICAL DATA:  61 year old female with right upper quadrant abdominal pain. EXAM: ULTRASOUND ABDOMEN LIMITED RIGHT UPPER QUADRANT COMPARISON:  CTA chest 03/22/2009. FINDINGS: Gallbladder: Wall-echo-shadow sign (WES sign) indicating a gallbladder contracted around numerous stones (image 4).  Positive sonographic Murphy sign elicited. No pericholecystic fluid is evident. Estimated individual gallstones size up to 19 mm. Common bile duct: Diameter: 10 mm, dilated. Liver: Questionable central, but no peripheral intrahepatic biliary ductal dilatation identified. No discrete liver lesion. Background liver echogenicity within normal limits. Portal vein is patent on color Doppler imaging with normal direction of blood flow towards the liver. Other: Negative visible right kidney. IMPRESSION: Gallbladder contracted around numerous stones with dilated CBD and positive sonographic Murphy sign suspicious for Choledocholithiasis +/- Acute Cholecystitis. Electronically Signed   By: Genevie Ann M.D.   On: 09/22/2021 09:39   ? ?IMPRESSION:  ?*Gallstone pancreatitis with suspected CBD stones on imaging: Lipase 2500 and ultrasound showing gallstones and suspicion for CBD stones.  MRCP has been ordered.  Surgery has seen the patient we will plan for laparoscopic cholecystectomy once acute pancreatitis improves and CBD stones have been removed. ? ?PLAN: ?-Await results of MRCP. ?-Tentatively post ERCP for 3/4 with Dr. Benson Norway. ?-Trend labs. ? ? ?Laban Emperor. Parish Dubose   09/22/2021, 11:56 AM ? ? ? ?  ?

## 2021-09-22 NOTE — ED Notes (Signed)
Pt. Assisted to bathroom with steady gait and back to her room without difficulty. ?

## 2021-09-22 NOTE — ED Notes (Signed)
Pt ambulated to bathroom with steady gait. 

## 2021-09-22 NOTE — Consult Note (Signed)
? ?Referring Provider: EDP, Laura Murphy, PA-C ?Primary Care Physician:  [facility] ?Primary Gastroenterologist:  Unassigned ? ?Reason for Consultation:  Gallstone pancreatitis; CBD stones ? ?HPI: Cassandra Wilkinson is a 60 y.o. female with past medical history of hypertension who presented to [place] emergency department today with complaints of abdominal pain for the past 4 days.  States the pain is in her right upper quadrant radiates to her back.  Initially was intermittent but has become more constant.  Associated with nausea and vomiting.  Symptoms are worse with p.o. intake.  Reports some chills, but no fever.  Has never had similar symptoms in the past.  He saw her PCP yesterday who was concerned about gallstones.  Unfortunately her pain became unbearable so she came to the emergency department. ? ?Lipase 2483.  AST 332, ALT 353, alk phos 170, total bili 4.4.  LFTs were normal when last checked 6 months ago.  CBC is normal.  She has received a dose of Rocephin. ? ?IMPRESSION: ?Gallbladder contracted around numerous stones with dilated CBD and ?positive sonographic Murphy sign suspicious for Choledocholithiasis ?+/- Acute Cholecystitis. ? ?MRCP has been ordered. ? ?Past Medical History:  ?Diagnosis Date  ? Hypertension   ? ? ?Past Surgical History:  ?Procedure Laterality Date  ? APPENDECTOMY    ? CESAREAN SECTION    ? TUBAL LIGATION    ? ? ?Prior to Admission medications   ?Medication Sig Start Date End Date Taking? Authorizing Provider  ?amLODipine (NORVASC) 10 MG tablet Take 10 mg by mouth daily. 12/18/20   [provider]  ?amoxicillin (AMOXIL) 500 MG capsule Take 1,000 mg by mouth 2 (two) times daily. 02/28/21   [provider]  ?Aspirin-Salicylamide-Caffeine (BC HEADACHE POWDER PO) Take 1 Package by mouth every 4 (four) hours as needed (for pain).    [provider]  ?cetirizine (ZYRTEC) 10 MG tablet Take 1 tablet (10 mg total) by mouth daily. ?Patient not  taking: No sig reported 07/25/16   Rose, Kayla, PA-C  ?erythromycin ophthalmic ointment Place a 1/2 inch ribbon of ointment into the lower eyelid 4 times daily for 5 days. ?Patient not taking: No sig reported 07/25/16   Rose, Kayla, PA-C  ?fluticasone (FLONASE) 50 MCG/ACT nasal spray Place 2 sprays into both nostrils daily. ?Patient not taking: No sig reported 05/14/20   Morelli, Brandon A, PA-C  ?lisinopril-hydrochlorothiazide (ZESTORETIC) 20-12.5 MG tablet Take 1 tablet by mouth daily. 06/29/17   Jacubowitz, Sam, MD  ? ? ?Current Facility-Administered Medications  ?Medication Dose Route Frequency Provider Last Rate Last Admin  ? HYDROmorphone (DILAUDID) injection 0.5 mg  0.5 mg Intravenous Q4H PRN Kyle, Tyrone A, DO      ? prochlorperazine (COMPAZINE) injection 10 mg  10 mg Intravenous Q6H PRN Kyle, Tyrone A, DO      ? ?Current Outpatient Medications  ?Medication Sig Dispense Refill  ? amLODipine (NORVASC) 10 MG tablet Take 10 mg by mouth daily.    ? amoxicillin (AMOXIL) 500 MG capsule Take 1,000 mg by mouth 2 (two) times daily.    ? Aspirin-Salicylamide-Caffeine (BC HEADACHE POWDER PO) Take 1 Package by mouth every 4 (four) hours as needed (for pain).    ? cetirizine (ZYRTEC) 10 MG tablet Take 1 tablet (10 mg total) by mouth daily. (Patient not taking: No sig reported) 30 tablet 0  ? erythromycin ophthalmic ointment Place a 1/2 inch ribbon of ointment into the lower eyelid 4 times daily for 5 days. (Patient not taking: No sig   reported) 3.5 g 0  ? fluticasone (FLONASE) 50 MCG/ACT nasal spray Place 2 sprays into both nostrils daily. (Patient not taking: No sig reported) 9.9 mL 0  ? lisinopril-hydrochlorothiazide (ZESTORETIC) 20-12.5 MG tablet Take 1 tablet by mouth daily. 30 tablet 0  ? ? ?Allergies as of 09/22/2021 - Review Complete 09/22/2021  ?Allergen Reaction Noted  ? Codeine Swelling 10/06/2011  ? Food Swelling 10/06/2011  ? ? ?Family History  ?Problem Relation Age of Onset  ? Heart failure Mother   ? Diabetes  Mother   ? Cancer Father   ? ? ?Social History  ? ?Socioeconomic History  ? Marital status: Divorced  ?  Spouse name: Not on file  ? Number of children: Not on file  ? Years of education: Not on file  ? Highest education level: Not on file  ?Occupational History  ? Not on file  ?Tobacco Use  ? Smoking status: Every Day  ?  Packs/day: 0.15  ?  Types: Cigarettes  ? Smokeless tobacco: Never  ?Vaping Use  ? Vaping Use: Never used  ?Substance and Sexual Activity  ? Alcohol use: No  ? Drug use: No  ? Sexual activity: Never  ?  Birth control/protection: None  ?Other Topics Concern  ? Not on file  ?Social History Narrative  ? Not on file  ? ?Social Determinants of Health  ? ?Financial Resource Strain: Not on file  ?Food Insecurity: Not on file  ?Transportation Needs: Not on file  ?Physical Activity: Not on file  ?Stress: Not on file  ?Social Connections: Not on file  ?Intimate Partner Violence: Not on file  ? ? ?Review of Systems: ?ROS is O/W negative except as mentioned in HPI. ? ?Physical Exam: ?Vital signs in last 24 hours: ?Temp:  [98.1 ?F (36.7 ?C)] 98.1 ?F (36.7 ?C) (03/03 6578) ?Pulse Rate:  [57-97] 57 (03/03 1037) ?Resp:  [18] 18 (03/03 1037) ?BP: (120-131)/(80-83) 131/80 (03/03 1037) ?SpO2:  [100 %] 100 % (03/03 1037) ?  ?General:  Alert, Well-developed, well-nourished, pleasant and cooperative in NAD ?Head:  Normocephalic and atraumatic. ?Eyes:  Sclera clear, no icterus.  Conjunctiva pink. ?Ears:  Normal auditory acuity. ?Mouth:  No deformity or lesions.   ?Lungs:  Clear throughout to auscultation.  No wheezes, crackles, or rhonchi.  ?Heart:  Regular rate and rhythm; no murmurs, clicks, rubs, or gallops. ?Abdomen:  Soft, no-distended.  BS present.  Mild upper abdominal TTP. ?Msk:  Symmetrical without gross deformities.  ?Pulses:  Normal pulses noted. ?Extremities:  Without clubbing or edema. ?Neurologic:  Alert and oriented x 4;  grossly normal neurologically. ?Skin:  Intact without significant lesions or  rashes. ?Psych:  Alert and cooperative. Normal mood and affect. ? ?Lab Results: ?Recent Labs  ?  09/22/21 ?0718  ?WBC 5.0  ?HGB 14.7  ?HCT 44.4  ?PLT 273  ? ?BMET ?Recent Labs  ?  09/22/21 ?0718  ?NA 137  ?K 3.2*  ?CL 102  ?CO2 26  ?GLUCOSE 112*  ?BUN 11  ?CREATININE 0.86  ?CALCIUM 9.0  ? ?LFT ?Recent Labs  ?  09/22/21 ?0718  ?PROT 7.8  ?ALBUMIN 4.0  ?AST 332*  ?ALT 353*  ?ALKPHOS 170*  ?BILITOT 4.4*  ? ? ?Studies/Results: ?US Abdomen Limited RUQ (LIVER/GB) ? ?Result Date: 09/22/2021 ?CLINICAL DATA:  61 year old female with right upper quadrant abdominal pain. EXAM: ULTRASOUND ABDOMEN LIMITED RIGHT UPPER QUADRANT COMPARISON:  CTA chest 03/22/2009. FINDINGS: Gallbladder: Wall-echo-shadow sign (WES sign) indicating a gallbladder contracted around numerous stones (image 4).  Positive sonographic Murphy sign elicited. No pericholecystic fluid is evident. Estimated individual gallstones size up to 19 mm. Common bile duct: Diameter: 10 mm, dilated. Liver: Questionable central, but no peripheral intrahepatic biliary ductal dilatation identified. No discrete liver lesion. Background liver echogenicity within normal limits. Portal vein is patent on color Doppler imaging with normal direction of blood flow towards the liver. Other: Negative visible right kidney. IMPRESSION: Gallbladder contracted around numerous stones with dilated CBD and positive sonographic Murphy sign suspicious for Choledocholithiasis +/- Acute Cholecystitis. Electronically Signed   By: Genevie Ann M.D.   On: 09/22/2021 09:39   ? ?IMPRESSION:  ?*Gallstone pancreatitis with suspected CBD stones on imaging: Lipase 2500 and ultrasound showing gallstones and suspicion for CBD stones.  MRCP has been ordered.  Surgery has seen the patient we will plan for laparoscopic cholecystectomy once acute pancreatitis improves and CBD stones have been removed. ? ?PLAN: ?-Await results of MRCP. ?-Tentatively post ERCP for 3/4 with Dr. Benson Norway. ?-Trend labs. ? ? ?Laban Emperor. Tynlee Bayle   09/22/2021, 11:56 AM ? ? ? ?  ?

## 2021-09-23 ENCOUNTER — Encounter (HOSPITAL_COMMUNITY)
Admission: EM | Disposition: A | Discharge status: Home / Self Care | Payer: Self-pay | Source: Home / Self Care | Attending: Family Medicine

## 2021-09-23 ENCOUNTER — Inpatient Hospital Stay (HOSPITAL_COMMUNITY): Payer: 59 | Admitting: Anesthesiology

## 2021-09-23 ENCOUNTER — Other Ambulatory Visit: Payer: Self-pay

## 2021-09-23 ENCOUNTER — Encounter (HOSPITAL_COMMUNITY): Payer: Self-pay | Admitting: Internal Medicine

## 2021-09-23 ENCOUNTER — Inpatient Hospital Stay (HOSPITAL_COMMUNITY): Payer: 59

## 2021-09-23 DIAGNOSIS — K8050 Calculus of bile duct without cholangitis or cholecystitis without obstruction: Secondary | ICD-10-CM

## 2021-09-23 HISTORY — PX: SPHINCTEROTOMY: SHX5544

## 2021-09-23 HISTORY — PX: ERCP: SHX5425

## 2021-09-23 LAB — COMPREHENSIVE METABOLIC PANEL
ALT: 212 U/L — ABNORMAL HIGH (ref 0–44)
AST: 120 U/L — ABNORMAL HIGH (ref 15–41)
Albumin: 3.3 g/dL — ABNORMAL LOW (ref 3.5–5.0)
Alkaline Phosphatase: 127 U/L — ABNORMAL HIGH (ref 38–126)
Anion gap: 7 (ref 5–15)
BUN: 9 mg/dL (ref 6–20)
CO2: 23 mmol/L (ref 22–32)
Calcium: 8.3 mg/dL — ABNORMAL LOW (ref 8.9–10.3)
Chloride: 107 mmol/L (ref 98–111)
Creatinine, Ser: 0.63 mg/dL (ref 0.44–1.00)
GFR, Estimated: >60 mL/min (ref >60)
Glucose, Bld: 99 mg/dL (ref 70–99)
Potassium: 2.8 mmol/L — ABNORMAL LOW (ref 3.5–5.1)
Sodium: 137 mmol/L (ref 135–145)
Total Bilirubin: 0.8 mg/dL (ref 0.3–1.2)
Total Protein: 6.5 g/dL (ref 6.5–8.1)

## 2021-09-23 LAB — CBC
HCT: 36.2 % (ref 36.0–46.0)
Hemoglobin: 11.7 g/dL — ABNORMAL LOW (ref 12.0–15.0)
MCH: 31.5 pg (ref 26.0–34.0)
MCHC: 32.3 g/dL (ref 30.0–36.0)
MCV: 97.6 fL (ref 80.0–100.0)
Platelets: 234 10*3/uL (ref 150–400)
RBC: 3.71 MIL/uL — ABNORMAL LOW (ref 3.87–5.11)
RDW: 15.1 % (ref 11.5–15.5)
WBC: 5.1 10*3/uL (ref 4.0–10.5)
nRBC: 0 % (ref 0.0–0.2)

## 2021-09-23 LAB — MAGNESIUM: Magnesium: 2.2 mg/dL (ref 1.7–2.4)

## 2021-09-23 IMAGING — RF DG ERCP WO/W SPHINCTEROTOMY
1 series · 5 of 5 positions shown · non-contrast
Comparison: MRCP-[DATE]; right upper quadrant abdominal
ultrasound-[DATE]

CLINICAL DATA: Cholelithiasis.

EXAM:
ERCP
TECHNIQUE: Multiple spot images obtained with the fluoroscopic device and
submitted for interpretation post-procedure.
FLUOROSCOPY TIME:
FLUOROSCOPY TIME
2 minutes, 28 seconds (56 mGy)

[Series 1: run · 5 of 5 slices shown]
[im 1/5]
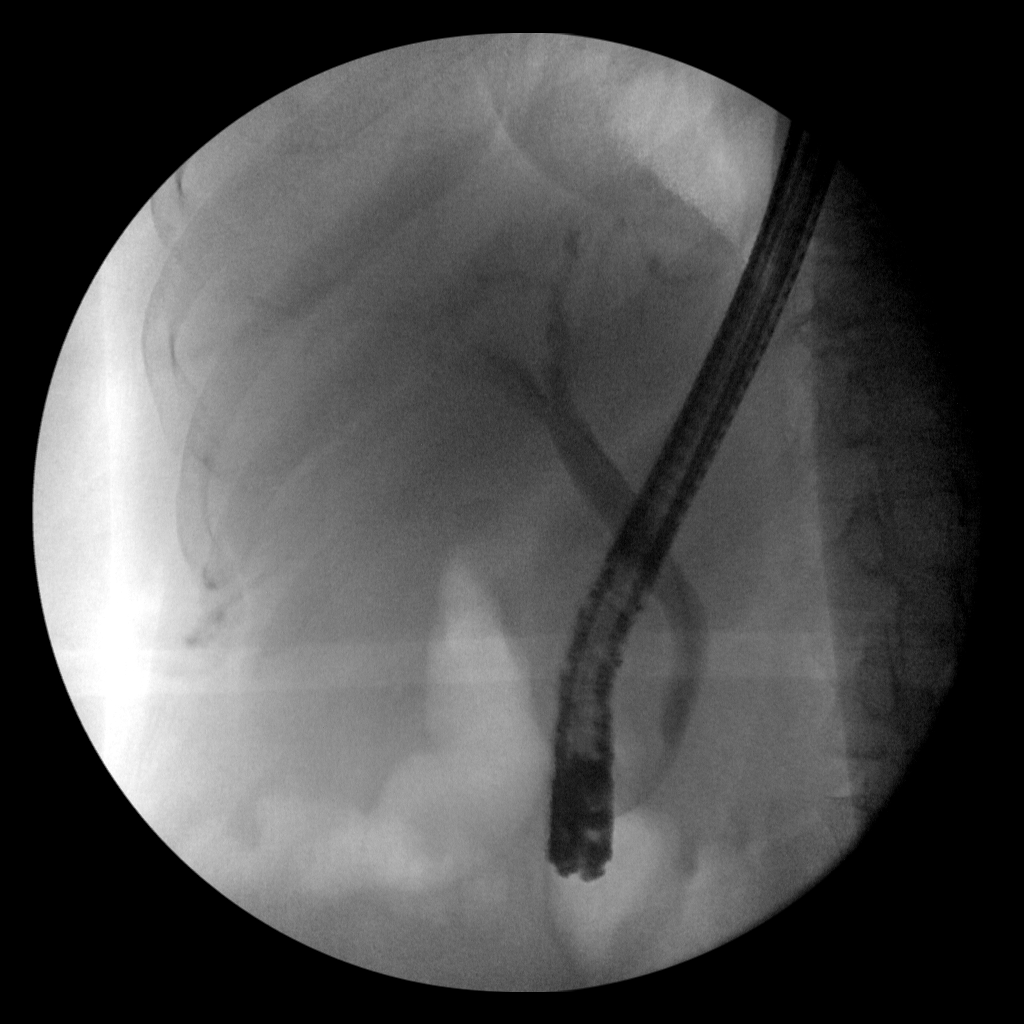
[im 2/5]
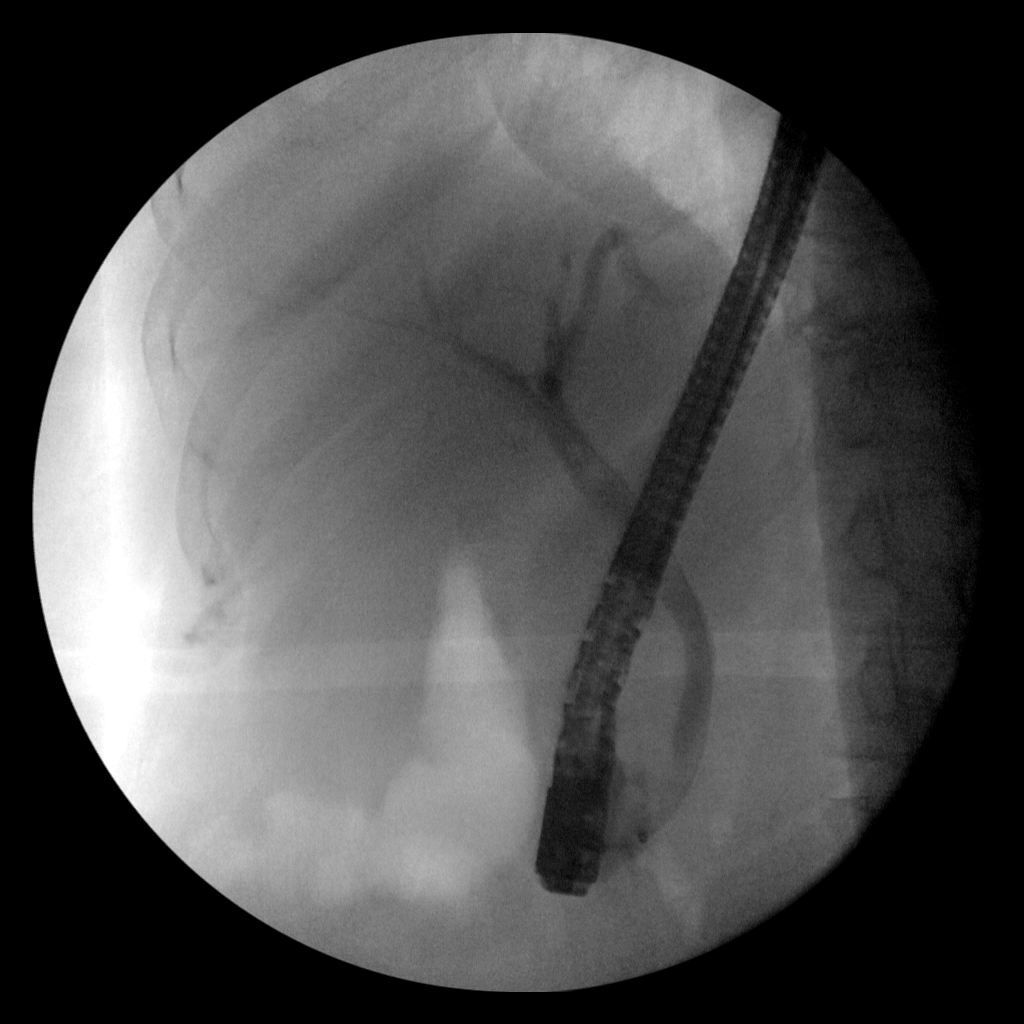
[im 3/5]
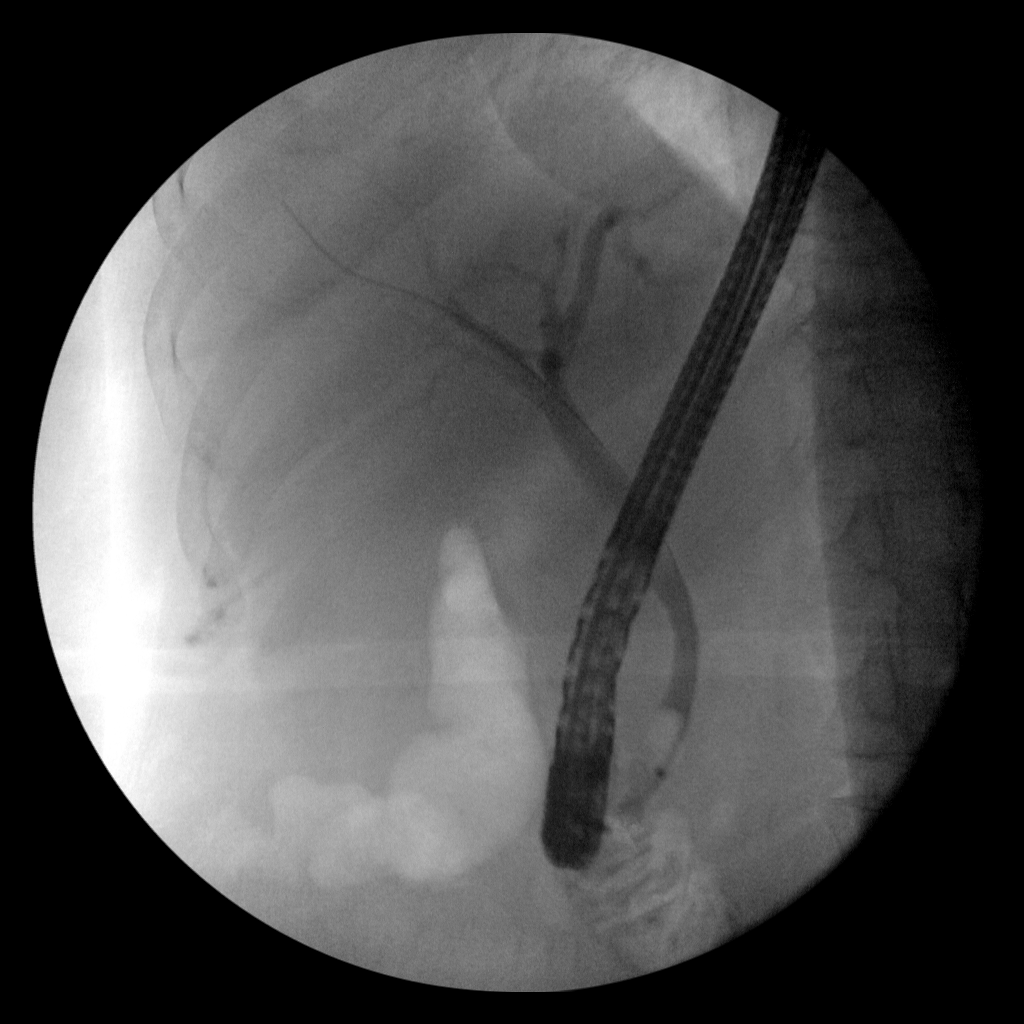
[im 4/5]
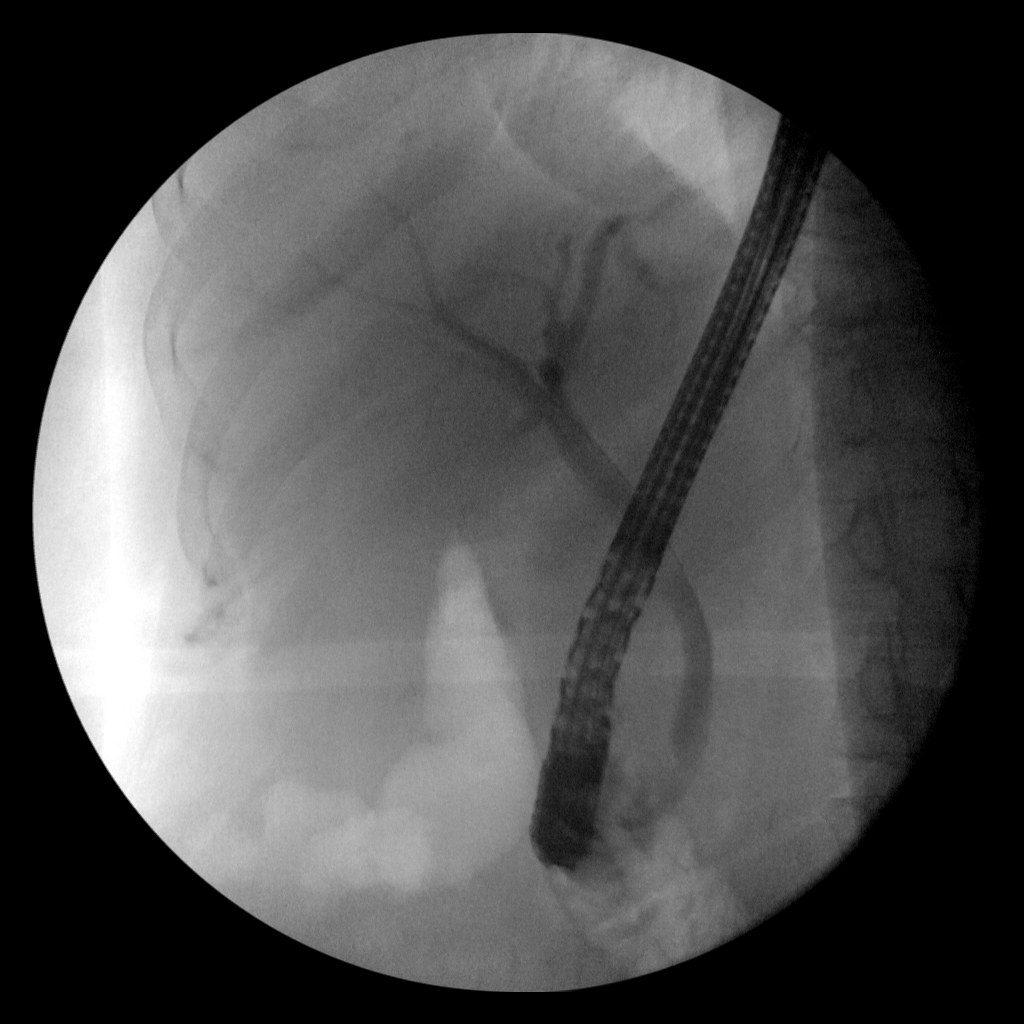
[im 5/5]
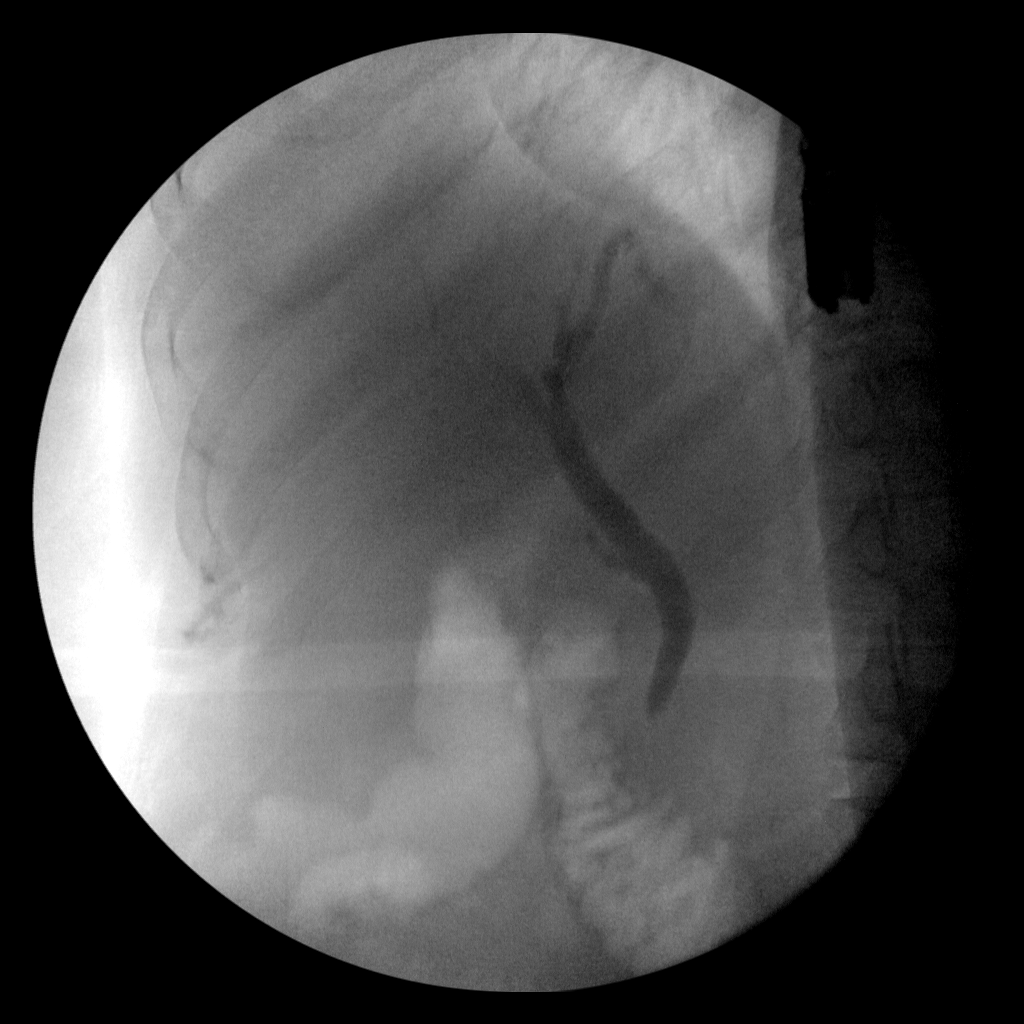

[5 of 5 positions shown; findings below may reference images not displayed]

FINDINGS: Five spot intraoperative fluoroscopic images the right upper
abdominal quadrant during ERCP are provided for review

Initial image demonstrates an ERCP probe overlying the right upper
abdominal quadrant. There is selective cannulation and opacification
of the CBD which appears mildly dilated. There are 2 nonocclusive
filling defects within the central aspect of the CBD, potentially
representative of air bubbles versus nonocclusive
choledocholithiasis.

Subsequent images demonstrate insufflation of a balloon within
distal aspect of the CBD with presumed biliary sweeping and
sphincterotomy.

There is minimal opacification of the central aspect of the
intrahepatic biliary tree as well as the cystic duct. There is no
definitive opacification of the pancreatic duct

Completion image does not definitively identify any persistent
filling defects within the opacified portion of the biliary tree.
IMPRESSION: ERCP with biliary sweeping and presumed sphincterotomy as above.

These images were submitted for radiologic interpretation only.
Please see the procedural report for the amount of contrast and the
fluoroscopy time utilized.

## 2021-09-23 SURGERY — ERCP, WITH INTERVENTION IF INDICATED
Anesthesia: General

## 2021-09-23 MED ORDER — INDOMETHACIN 50 MG RE SUPP
RECTAL | Status: DC | PRN
Start: 2021-09-23 — End: 2021-09-23
  Administered 2021-09-23: 100 mg via RECTAL

## 2021-09-23 MED ORDER — CIPROFLOXACIN IN D5W 400 MG/200ML IV SOLN
INTRAVENOUS | Status: AC
Start: 2021-09-23 — End: ?
  Filled 2021-09-23: qty 200

## 2021-09-23 MED ORDER — PHENYLEPHRINE 40 MCG/ML (10ML) SYRINGE FOR IV PUSH (FOR BLOOD PRESSURE SUPPORT)
PREFILLED_SYRINGE | INTRAVENOUS | Status: DC | PRN
  Administered 2021-09-23: 80 ug via INTRAVENOUS

## 2021-09-23 MED ORDER — GLUCAGON HCL RDNA (DIAGNOSTIC) 1 MG IJ SOLR
INTRAMUSCULAR | Status: AC
  Filled 2021-09-23: qty 1

## 2021-09-23 MED ORDER — SODIUM CHLORIDE 0.9 % IV SOLN
INTRAVENOUS | Status: DC | PRN
  Administered 2021-09-23: 30 mL

## 2021-09-23 MED ORDER — ONDANSETRON HCL 4 MG/2ML IJ SOLN
INTRAMUSCULAR | Status: DC | PRN
  Administered 2021-09-23: 4 mg via INTRAVENOUS

## 2021-09-23 MED ORDER — POTASSIUM CHLORIDE CRYS ER 20 MEQ PO TBCR
40.0000 meq | EXTENDED_RELEASE_TABLET | Freq: Once | ORAL | Status: AC
  Administered 2021-09-23: 40 meq via ORAL
  Filled 2021-09-23: qty 2

## 2021-09-23 MED ORDER — PROPOFOL 1000 MG/100ML IV EMUL
INTRAVENOUS | Status: AC
  Filled 2021-09-23: qty 100

## 2021-09-23 MED ORDER — HYDROCHLOROTHIAZIDE 12.5 MG PO TABS
12.5000 mg | ORAL_TABLET | Freq: Two times a day (BID) | ORAL | Status: DC
  Administered 2021-09-23 – 2021-09-24 (×3): 12.5 mg via ORAL
  Filled 2021-09-23 (×3): qty 1

## 2021-09-23 MED ORDER — FENTANYL CITRATE (PF) 100 MCG/2ML IJ SOLN
INTRAMUSCULAR | Status: AC
  Filled 2021-09-23: qty 2

## 2021-09-23 MED ORDER — FENTANYL CITRATE (PF) 100 MCG/2ML IJ SOLN
INTRAMUSCULAR | Status: DC | PRN
  Administered 2021-09-23: 100 ug via INTRAVENOUS

## 2021-09-23 MED ORDER — PROPOFOL 10 MG/ML IV BOLUS
INTRAVENOUS | Status: AC
  Filled 2021-09-23: qty 20

## 2021-09-23 MED ORDER — PROPOFOL 500 MG/50ML IV EMUL
INTRAVENOUS | Status: AC
  Filled 2021-09-23: qty 100

## 2021-09-23 MED ORDER — SODIUM CHLORIDE 0.9 % IV SOLN
1.5000 g | Freq: Once | INTRAVENOUS | Status: DC
Start: 2021-09-23 — End: 2021-09-23
  Filled 2021-09-23: qty 4

## 2021-09-23 MED ORDER — LIDOCAINE HCL (CARDIAC) PF 100 MG/5ML IV SOSY
PREFILLED_SYRINGE | INTRAVENOUS | Status: DC | PRN
Start: 2021-09-23 — End: 2021-09-23
  Administered 2021-09-23: 60 mg via INTRAVENOUS

## 2021-09-23 MED ORDER — POTASSIUM CHLORIDE 2 MEQ/ML IV SOLN
INTRAVENOUS | Status: DC
  Filled 2021-09-23 (×7): qty 1000

## 2021-09-23 MED ORDER — INDOMETHACIN 50 MG RE SUPP
RECTAL | Status: AC
  Filled 2021-09-23: qty 2

## 2021-09-23 MED ORDER — SUGAMMADEX SODIUM 200 MG/2ML IV SOLN
INTRAVENOUS | Status: DC | PRN
  Administered 2021-09-23: 200 mg via INTRAVENOUS

## 2021-09-23 MED ORDER — LISINOPRIL 20 MG PO TABS
20.0000 mg | ORAL_TABLET | Freq: Two times a day (BID) | ORAL | Status: DC
  Administered 2021-09-23 – 2021-09-24 (×3): 20 mg via ORAL
  Filled 2021-09-23 (×3): qty 1

## 2021-09-23 MED ORDER — AMLODIPINE BESYLATE 10 MG PO TABS
10.0000 mg | ORAL_TABLET | Freq: Every morning | ORAL | Status: DC
  Administered 2021-09-24: 10 mg via ORAL
  Filled 2021-09-23: qty 1

## 2021-09-23 MED ORDER — SODIUM CHLORIDE 0.9 % IV SOLN: INTRAVENOUS | Status: DC

## 2021-09-23 MED ORDER — INDOMETHACIN 50 MG RE SUPP: 100.0000 mg | Freq: Once | RECTAL | Status: DC

## 2021-09-23 MED ORDER — LISINOPRIL-HYDROCHLOROTHIAZIDE 20-12.5 MG PO TABS: 1.0000 | ORAL_TABLET | Freq: Every day | ORAL | Status: DC

## 2021-09-23 MED ORDER — PROPOFOL 10 MG/ML IV BOLUS
INTRAVENOUS | Status: DC | PRN
  Administered 2021-09-23: 140 mg via INTRAVENOUS

## 2021-09-23 MED ORDER — ROCURONIUM BROMIDE 100 MG/10ML IV SOLN
INTRAVENOUS | Status: DC | PRN
  Administered 2021-09-23: 70 mg via INTRAVENOUS

## 2021-09-23 MED ORDER — LACTATED RINGERS IV SOLN: INTRAVENOUS | Status: DC | PRN

## 2021-09-23 NOTE — Anesthesia Procedure Notes (Signed)
Procedure Name: Intubation ?Date/Time: 09/23/2021 7:42 AM ?Performed by: [place] Singleton Hickox C, CRNA ?Pre-anesthesia Checklist: Patient identified, Emergency Drugs available, Suction available and Patient being monitored ?Patient Re-evaluated:Patient Re-evaluated prior to induction ?Oxygen Delivery Method: Circle system utilized ?Preoxygenation: Pre-oxygenation with 100% oxygen ?Induction Type: IV induction ?Ventilation: Mask ventilation without difficulty ?Laryngoscope Size: Mac and 3 ?Grade View: Grade I ?Tube type: Oral ?Tube size: 7.0 mm ?Number of attempts: 1 ?Airway Equipment and Method: Stylet and Oral airway ?Placement Confirmation: ETT inserted through vocal cords under direct vision, positive ETCO2 and breath sounds checked- equal and bilateral ?Tube secured with: Tape ?Dental Injury: Teeth and Oropharynx as per pre-operative assessment  ? ? ? ? ?

## 2021-09-23 NOTE — Anesthesia Preprocedure Evaluation (Signed)
Anesthesia Evaluation  ?Patient identified by MRN, date of birth, ID band ?Patient awake ? ? ? ?Reviewed: ?Allergy & Precautions, NPO status , Patient's Chart, lab work & pertinent test results ? ?History of Anesthesia Complications ?Negative for: history of anesthetic complications ? ?Airway ?Mallampati: III ? ?TM Distance: >3 FB ?Neck ROM: Full ? ? ? Dental ? ?(+) Teeth Intact, Dental Advisory Given,  ?  ?Pulmonary ?neg shortness of breath, neg COPD, neg recent URI, Current Smoker and Patient abstained from smoking.,  ?  ?breath sounds clear to auscultation ? ? ? ? ? ? Cardiovascular ?hypertension, Pt. on medications ?(-) angina(-) Past MI and (-) CHF  ?Rhythm:Regular  ? ?  ?Neuro/Psych ?negative neurological ROS ? negative psych ROS  ? GI/Hepatic ?negative GI ROS,  CBD stones ?  ?Endo/Other  ?negative endocrine ROSNo results found for: HGBA1C ? ? Renal/GU ?negative Renal ROS  ? ?  ?Musculoskeletal ?negative musculoskeletal ROS ?(+)  ? Abdominal ?  ?Peds ? Hematology ? ?(+) Blood dyscrasia, anemia , Lab Results ?     Component                Value               Date                 ?     WBC                      5.1                 09/23/2021           ?     HGB                      11.7 (L)            09/23/2021           ?     HCT                      36.2                09/23/2021           ?     MCV                      97.6                09/23/2021           ?     PLT                      234                 09/23/2021           ?   ?Anesthesia Other Findings ? ? Reproductive/Obstetrics ? ?  ? ? ? ? ? ? ? ? ? ? ? ? ? ?  ?  ? ? ? ? ? ? ? ? ?Anesthesia Physical ?Anesthesia Plan ? ?ASA: 2 ? ?Anesthesia Plan: General  ? ?Post-op Pain Management: Minimal or no pain anticipated  ? ?Induction: Intravenous ? ?PONV Risk Score and Plan: 2 and Ondansetron and Dexamethasone ? ?Airway Management Planned: Oral ETT ? ?Additional Equipment: None ? ?Intra-op Plan:  ? ?Post-operative Plan:  Extubation in OR ? ?Informed Consent: I have reviewed the  patients History and Physical, chart, labs and discussed the procedure including the risks, benefits and alternatives for the proposed anesthesia with the patient or authorized representative who has indicated his/her understanding and acceptance.  ? ? ? ?Dental advisory given ? ?Plan Discussed with: CRNA and Anesthesiologist ? ?Anesthesia Plan Comments:   ? ? ? ? ? ? ?Anesthesia Quick Evaluation ? ?

## 2021-09-23 NOTE — Interval H&P Note (Signed)
History and Physical Interval Note: ? ?09/23/2021 ?7:28 AM ? ?Cassandra Wilkinson  has presented today for surgery, with the diagnosis of CBD stones.  The various methods of treatment have been discussed with the patient and family. After consideration of risks, benefits and other options for treatment, the patient has consented to  Procedure(s): ?ENDOSCOPIC RETROGRADE CHOLANGIOPANCREATOGRAPHY (ERCP) (N/A) as a surgical intervention.  The patient's history has been reviewed, patient examined, no change in status, stable for surgery.  I have reviewed the patient's chart and labs.  Questions were answered to the patient's satisfaction.   ? ? ?Thane Age D ? ? ?

## 2021-09-23 NOTE — Op Note (Signed)
[facility] ?Patient Name: Cassandra Wilkinson ?Procedure Date: 09/23/2021 ?MRN: [id number] ?Attending MD: Carol Ada , MD ?Date of Birth: 1961/05/20 ?CSN: [id number] ?Age: 61 ?Admit Type: Inpatient ?Procedure:                ERCP ?Indications:              Common bile duct stone(s) ?Providers:                Carol Ada, MD, Grace Isaac, RN, Alphonzo Grieve  ?                          Leighton Roach, Technician ?Referring MD:              ?Medicines:                General Anesthesia ?Complications:            No immediate complications. ?Estimated Blood Loss:     Estimated blood loss: none. ?Procedure:                Pre-Anesthesia Assessment: ?                          - Prior to the procedure, a History and Physical  ?                          was performed, and patient medications and  ?                          allergies were reviewed. The patient's tolerance of  ?                          previous anesthesia was also reviewed. The risks  ?                          and benefits of the procedure and the sedation  ?                          options and risks were discussed with the patient.  ?                          All questions were answered, and informed consent  ?                          was obtained. Prior Anticoagulants: The patient has  ?                          taken no previous anticoagulant or antiplatelet  ?                          agents. ASA Grade Assessment: II - A patient with  ?                          mild systemic disease. After reviewing the risks  ?                          and benefits,  the patient was deemed in  ?                          satisfactory condition to undergo the procedure. ?                          - Sedation was administered by an anesthesia  ?                          professional. Deep sedation was attained. ?                          After obtaining informed consent, the scope was  ?                          passed under direct vision. Throughout the  ?                           procedure, the patient's blood pressure, pulse, and  ?                          oxygen saturations were monitored continuously. The  ?                          [id number]-[id number] ([id number]) Olympus duodenoscope was  ?                          introduced through the mouth, and used to inject  ?                          contrast into and used to inject contrast into the  ?                          bile duct and ventral pancreatic duct. The ERCP was  ?                          accomplished without difficulty. The patient  ?                          tolerated the procedure well. ?Findings: ?     The major papilla was normal. A short 0.035 inch Soft Antonietta Breach was passed  ?     into the biliary tree. A 10 mm biliary sphincterotomy was made with a  ?     traction (standard) sphincterotome using ERBE electrocautery. There was  ?     no post-sphincterotomy bleeding. The biliary tree was swept with a 15 mm  ?     balloon starting at the bifurcation. Nothing was found. ?     The CBD was cannulated after a one time cannulation of the PD. The  ?     guidewire was secured in the right intrahepatic ducts. Contrast  ?     injection revealed the CBD to be around 10 cm and there was evidence of  ?     intrahepatic biliary ductal dilation. There was no clear evidence of any  ?     filling  defects. The CBD was swept multiple times and and there was no  ?     obvious stone(s) that was removed. The final occlusion cholangiogram was  ?     negative for any retained stones. ?Impression:               - The major papilla appeared normal. ?                          - A biliary sphincterotomy was performed. ?                          - The biliary tree was swept and nothing was found. ?Moderate Sedation: ?     Not Applicable - Patient had care per Anesthesia. ?Recommendation:           - Return patient to hospital ward for ongoing care. ?                          - Resume regular diet. ?                          - Replete potassium ?                           - Lap chole per Surgery. ?Procedure Code(s):        --- Professional --- ?                          [phone], Endoscopic retrograde  ?                          cholangiopancreatography (ERCP); with  ?                          sphincterotomy/papillotomy ?Diagnosis Code(s):        --- Professional --- ?                          K80.50, Calculus of bile duct without cholangitis  ?                          or cholecystitis without obstruction ?CPT copyright 2019 [employer]. All rights reserved. ?The codes documented in this report are preliminary and upon coder review may  ?be revised to meet current compliance requirements. ?Carol Ada, MD ?Carol Ada, MD ?09/23/2021 8:27:26 AM ?This report has been signed electronically. ?Number of Addenda: 0 ?

## 2021-09-23 NOTE — Transfer of Care (Signed)
Immediate Anesthesia Transfer of Care Note ? ?Patient: Cassandra Wilkinson ? ?Procedure(s) Performed: ENDOSCOPIC RETROGRADE CHOLANGIOPANCREATOGRAPHY (ERCP) ?SPHINCTEROTOMY ? ?Patient Location: PACU ? ?Anesthesia Type:General ? ?Level of Consciousness: awake, alert  and oriented ? ?Airway & Oxygen Therapy: Patient Spontanous Breathing and Patient connected to nasal cannula oxygen ? ?Post-op Assessment: Report given to RN and Post -op Vital signs reviewed and stable ? ?Post vital signs: Reviewed and stable ? ?Last Vitals:  ?Vitals Value Taken Time  ?BP 162/87 09/23/21 0831  ?Temp 36.6 ?C 09/23/21 0830  ?Pulse 57 09/23/21 0839  ?Resp 18 09/23/21 0839  ?SpO2 100 % 09/23/21 0839  ?Vitals shown include unvalidated device data. ? ?Last Pain:  ?Vitals:  ? 09/23/21 0830  ?TempSrc:   ?PainSc: 0-No pain  ?   ? ?  ? ?Complications: No notable events documented. ?

## 2021-09-23 NOTE — Progress Notes (Signed)
PROGRESS NOTE   Cassandra Wilkinson  DGL:[id number] DOB: 05/17/1961 DOA: 09/22/2021 PCP: [facility].  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 Benson [place] 3/4 Surgery planning lap chole?  09/25/2021  Hospital-Problem based course  Resolved choledocholithiasis in the setting of?  Cholecystitis ERCP as below Trend lipase in a.m. secondary to ERCP, empiric ceftriaxone every 24 Hydromorphone every 4 as needed 0.5 mg, Compazine 10 every 6 as needed nausea Trend LFTs Patient able to tolerate diet at this time no nausea no vomiting and pain is improved Severe hypokalemia Completed 40 mEq potassium continue D5 with 40 of K for 24 hours and recheck a.m. labs Magnesium is 2.2 HTN As needed hydralazine 10 every 8-resume lisinopril HCTZ zotepine 10,  DVT prophylaxis: Lovenox Code Status: Full Family Communication: Discussed with patient's daughter Asencion Partridge at the bedside as well as patient's son Disposition:  Status is: Inpatient Remains inpatient appropriate because:   Needs lap chole    Consultants:  GI General surgery  Procedures: ERCP  Impression:               - The major papilla appeared normal.                           - A biliary sphincterotomy was performed.                           - The biliary tree was swept and nothing was found. Moderate Sedation:      Not Applicable - Patient had care per Anesthesia. Recommendation:           - Return patient to hospital ward for ongoing care.                           - Resume regular diet.                           - Replete potassium                           - Lap chole per  Surgery.  Antimicrobials: Ceftriaxone   Subjective: No pain whatsoever Tolerating some diet from [facility] No fever no chills No nausea no vomiting  Objective: Vitals:   09/23/21 0850 09/23/21 0900 09/23/21 0929 09/23/21 1329  BP: (!) 154/92 139/89 (!) 186/97 (!) 148/81  Pulse: (!) 54 (!) 56 62 63  Resp: '17 14 16 16  '$ Temp:  98 F (36.7 C) 98.2 F (36.8 C) 98.5 F (36.9 C)  TempSrc:   Oral Oral  SpO2: 98% 97% 99% 100%  Weight:      Height:        Intake/Output Summary (Last 24 hours) at 09/23/2021 1357 Last data filed at 09/23/2021 0841 Gross per 24 hour  Intake 1107.5 ml  Output --  Net 1107.5 ml   Filed Weights   09/23/21 0022  Weight: 100.5 kg    Examination:  EOMI NCAT no focal deficit CTA B no added sound rales rhonchi Abdomen soft nontender  in epigastrium No lower extremity edema Power 5/5 reflexes intact  Data Reviewed: personally reviewed   CBC    Component Value Date/Time   WBC 5.1 09/23/2021 0438   RBC 3.71 (L) 09/23/2021 0438   HGB 11.7 (L) 09/23/2021 0438   HCT 36.2 09/23/2021 0438   PLT 234 09/23/2021 0438   MCV 97.6 09/23/2021 0438   MCH 31.5 09/23/2021 0438   MCHC 32.3 09/23/2021 0438   RDW 15.1 09/23/2021 0438   LYMPHSABS 0.8 09/22/2021 0718   MONOABS 0.4 09/22/2021 0718   EOSABS 0.0 09/22/2021 0718   BASOSABS 0.0 09/22/2021 0718   CMP Latest Ref Rng & Units 09/23/2021 09/22/2021 03/05/2021  Glucose 70 - 99 mg/dL 99 112(H) 107(H)  BUN 6 - 20 mg/dL 9 11 21(H)  Creatinine 0.44 - 1.00 mg/dL 0.63 0.86 1.20(H)  Sodium 135 - 145 mmol/L 137 137 135  Potassium 3.5 - 5.1 mmol/L 2.8(L) 3.2(L) 3.2(L)  Chloride 98 - 111 mmol/L 107 102 97(L)  CO2 22 - 32 mmol/L '23 26 27  '$ Calcium 8.9 - 10.3 mg/dL 8.3(L) 9.0 9.1  Total Protein 6.5 - 8.1 g/dL 6.5 7.8 8.0  Total Bilirubin 0.3 - 1.2 mg/dL 0.8 4.4(H) 1.2  Alkaline Phos 38 - 126 U/L 127(H) 170(H) 73  AST 15 - 41 U/L 120(H) 332(H) 25  ALT 0 - 44 U/L 212(H) 353(H) 15     Radiology Studies: MR 3D Recon  At Scanner  Result Date: 09/22/2021 EXAM: MRI ABDOMEN WITHOUT AND WITH CONTRAST (INCLUDING MRCP) TECHNIQUE: Multiplanar multisequence MR imaging of the abdomen was performed both before and after the administration of intravenous contrast. Heavily T2-weighted images of the biliary and pancreatic ducts were obtained, and three-dimensional MRCP images were rendered by post processing. CONTRAST:  107m GADAVIST GADOBUTROL 1 MMOL/ML IV SOLN COMPARISON:  CT chest angiogram, 03/22/2009 FINDINGS: Lower chest: No acute findings.  Small hiatal hernia. Hepatobiliary: Benign simple cyst of the left lobe of the liver (series 17, image 21). No mass or other parenchymal abnormality identified. Numerous gallstones in the gallbladder, and several small calculi in the gallbladder neck as well as within the proximal cystic duct measuring no greater than 0.3 cm (series 8, image 47). Low insertion of the cystic duct on the common bile duct. The common bile duct measures up to 0.8 cm in caliber. No intrahepatic biliary ductal dilatation. Pancreas: No mass, inflammatory changes, or other parenchymal abnormality identified.No pancreatic ductal dilatation. Spleen:  Within normal limits in size and appearance. Adrenals/Urinary Tract: Stable, definitively benign fat containing left adrenal adenoma (series 14, image 44). No renal masses or suspicious contrast enhancement identified. No evidence of hydronephrosis. Stomach/Bowel: Visualized portions within the abdomen are unremarkable. Vascular/Lymphatic: No pathologically enlarged lymph nodes identified. No abdominal aortic aneurysm demonstrated. Other:  None. Musculoskeletal: No suspicious osseous lesions identified. IMPRESSION: 1. Numerous gallstones in the gallbladder, and several small calculi in the gallbladder neck as well as within the proximal cystic duct measuring no greater than 0.3 cm. No gallbladder wall thickening or inflammatory findings. 2. The common bile duct measures up to  0.8 cm in caliber, near the upper limit of normal in caliber, of uncertain significance, however without common bile duct calculus or other obstructing lesion identified to the ampulla. 3. Small hiatal hernia. Electronically Signed   By: ADelanna AhmadiM.D.   On: 09/22/2021 15:24   DG ERCP  Result Date: 09/23/2021 CLINICAL DATA:  Cholelithiasis. EXAM: ERCP TECHNIQUE: Multiple spot images obtained with the fluoroscopic device and submitted for interpretation  post-procedure. FLUOROSCOPY TIME: FLUOROSCOPY TIME 2 minutes, 28 seconds (56 mGy) COMPARISON:  MRCP-09/22/2021; right upper quadrant abdominal ultrasound-09/22/2021 FINDINGS: Five spot intraoperative fluoroscopic images the right upper abdominal quadrant during ERCP are provided for review Initial image demonstrates an ERCP probe overlying the right upper abdominal quadrant. There is selective cannulation and opacification of the CBD which appears mildly dilated. There are 2 nonocclusive filling defects within the central aspect of the CBD, potentially representative of air bubbles versus nonocclusive choledocholithiasis. Subsequent images demonstrate insufflation of a balloon within distal aspect of the CBD with presumed biliary sweeping and sphincterotomy. There is minimal opacification of the central aspect of the intrahepatic biliary tree as well as the cystic duct. There is no definitive opacification of the pancreatic duct Completion image does not definitively identify any persistent filling defects within the opacified portion of the biliary tree. IMPRESSION: ERCP with biliary sweeping and presumed sphincterotomy as above. These images were submitted for radiologic interpretation only. Please see the procedural report for the amount of contrast and the fluoroscopy time utilized. Electronically Signed   By: Sandi Mariscal M.D.   On: 09/23/2021 09:38   MR ABDOMEN MRCP W WO CONTAST  Result Date: 09/22/2021 EXAM: MRI ABDOMEN WITHOUT AND WITH CONTRAST  (INCLUDING MRCP) TECHNIQUE: Multiplanar multisequence MR imaging of the abdomen was performed both before and after the administration of intravenous contrast. Heavily T2-weighted images of the biliary and pancreatic ducts were obtained, and three-dimensional MRCP images were rendered by post processing. CONTRAST:  55m GADAVIST GADOBUTROL 1 MMOL/ML IV SOLN COMPARISON:  CT chest angiogram, 03/22/2009 FINDINGS: Lower chest: No acute findings.  Small hiatal hernia. Hepatobiliary: Benign simple cyst of the left lobe of the liver (series 17, image 21). No mass or other parenchymal abnormality identified. Numerous gallstones in the gallbladder, and several small calculi in the gallbladder neck as well as within the proximal cystic duct measuring no greater than 0.3 cm (series 8, image 47). Low insertion of the cystic duct on the common bile duct. The common bile duct measures up to 0.8 cm in caliber. No intrahepatic biliary ductal dilatation. Pancreas: No mass, inflammatory changes, or other parenchymal abnormality identified.No pancreatic ductal dilatation. Spleen:  Within normal limits in size and appearance. Adrenals/Urinary Tract: Stable, definitively benign fat containing left adrenal adenoma (series 14, image 44). No renal masses or suspicious contrast enhancement identified. No evidence of hydronephrosis. Stomach/Bowel: Visualized portions within the abdomen are unremarkable. Vascular/Lymphatic: No pathologically enlarged lymph nodes identified. No abdominal aortic aneurysm demonstrated. Other:  None. Musculoskeletal: No suspicious osseous lesions identified. IMPRESSION: 1. Numerous gallstones in the gallbladder, and several small calculi in the gallbladder neck as well as within the proximal cystic duct measuring no greater than 0.3 cm. No gallbladder wall thickening or inflammatory findings. 2. The common bile duct measures up to 0.8 cm in caliber, near the upper limit of normal in caliber, of uncertain  significance, however without common bile duct calculus or other obstructing lesion identified to the ampulla. 3. Small hiatal hernia. Electronically Signed   By: ADelanna AhmadiM.D.   On: 09/22/2021 15:24   [place]Abdomen Limited RUQ (LIVER/GB)  Result Date: 09/22/2021 CLINICAL DATA:  61year old female with right upper quadrant abdominal pain. EXAM: ULTRASOUND ABDOMEN LIMITED RIGHT UPPER QUADRANT COMPARISON:  CTA chest 03/22/2009. FINDINGS: Gallbladder: Wall-echo-shadow sign (WES sign) indicating a gallbladder contracted around numerous stones (image 4). Positive sonographic Murphy sign elicited. No pericholecystic fluid is evident. Estimated individual gallstones size up to 19 mm. Common bile  duct: Diameter: 10 mm, dilated. Liver: Questionable central, but no peripheral intrahepatic biliary ductal dilatation identified. No discrete liver lesion. Background liver echogenicity within normal limits. Portal vein is patent on color Doppler imaging with normal direction of blood flow towards the liver. Other: Negative visible right kidney. IMPRESSION: Gallbladder contracted around numerous stones with dilated CBD and positive sonographic Murphy sign suspicious for Choledocholithiasis +/- Acute Cholecystitis. Electronically Signed   By: Genevie Ann M.D.   On: 09/22/2021 09:39     Scheduled Meds:  enoxaparin (LOVENOX) injection  40 mg Subcutaneous Q24H   indomethacin  100 mg Rectal Once   Continuous Infusions:  cefTRIAXone (ROCEPHIN)  IV 2 g (09/23/21 1007)   dextrose 5 % 1,000 mL with potassium chloride 40 mEq infusion 100 mL/hr at 09/23/21 1005     LOS: 1 day   Time spent: [place], MD [facility] To contact the attending provider between 7A-7P or the covering provider during after hours 7P-7A, please log into the web site [email or web address] and access using universal [place] password for that web site. If you do not have the password, please call the hospital operator.  09/23/2021,  1:57 PM

## 2021-09-23 NOTE — Anesthesia Postprocedure Evaluation (Signed)
Anesthesia Post Note ? ?Patient: Ajaya Zentz ? ?Procedure(s) Performed: ENDOSCOPIC RETROGRADE CHOLANGIOPANCREATOGRAPHY (ERCP) ?SPHINCTEROTOMY ? ?  ? ?Patient location during evaluation: PACU ?Anesthesia Type: General ?Level of consciousness: awake and alert ?Pain management: pain level controlled ?Vital Signs Assessment: post-procedure vital signs reviewed and stable ?Respiratory status: spontaneous breathing, nonlabored ventilation, respiratory function stable and patient connected to nasal cannula oxygen ?Cardiovascular status: blood pressure returned to baseline and stable ?Postop Assessment: no apparent nausea or vomiting ?Anesthetic complications: no ? ? ?No notable events documented. ? ?Last Vitals:  ?Vitals:  ? 09/23/21 0929 09/23/21 1329  ?BP: (!) 186/97 (!) 148/81  ?Pulse: 62 63  ?Resp: 16 16  ?Temp: 36.8 ?C 36.9 ?C  ?SpO2: 99% 100%  ?  ?Last Pain:  ?Vitals:  ? 09/23/21 1329  ?TempSrc: Oral  ?PainSc:   ? ? ?  ?  ?  ?  ?  ?  ? ?Liah Morr ? ? ? ? ?

## 2021-09-24 ENCOUNTER — Encounter (HOSPITAL_COMMUNITY): Payer: Self-pay | Admitting: Gastroenterology

## 2021-09-24 LAB — COMPREHENSIVE METABOLIC PANEL
ALT: 150 U/L — ABNORMAL HIGH (ref 0–44)
AST: 59 U/L — ABNORMAL HIGH (ref 15–41)
Albumin: 3.3 g/dL — ABNORMAL LOW (ref 3.5–5.0)
Alkaline Phosphatase: 112 U/L (ref 38–126)
Anion gap: 6 (ref 5–15)
BUN: 9 mg/dL (ref 6–20)
CO2: 25 mmol/L (ref 22–32)
Calcium: 8.5 mg/dL — ABNORMAL LOW (ref 8.9–10.3)
Chloride: 105 mmol/L (ref 98–111)
Creatinine, Ser: 0.59 mg/dL (ref 0.44–1.00)
GFR, Estimated: >60 mL/min (ref >60)
Glucose, Bld: 102 mg/dL — ABNORMAL HIGH (ref 70–99)
Potassium: 3.7 mmol/L (ref 3.5–5.1)
Sodium: 136 mmol/L (ref 135–145)
Total Bilirubin: 0.7 mg/dL (ref 0.3–1.2)
Total Protein: 6.5 g/dL (ref 6.5–8.1)

## 2021-09-24 LAB — LIPASE, BLOOD: Lipase: 67 U/L — ABNORMAL HIGH (ref 11–51)

## 2021-09-24 MED ORDER — ACETAMINOPHEN 500 MG PO TABS
ORAL_TABLET | ORAL | Status: AC
  Administered 2021-09-24: 500 mg via ORAL
  Filled 2021-09-24: qty 1

## 2021-09-24 MED ORDER — PEG 3350-KCL-NA BICARB-NACL 420 G PO SOLR
4000.0000 mL | Freq: Once | ORAL | Status: AC
  Administered 2021-09-24: 4000 mL via ORAL

## 2021-09-24 MED ORDER — ACETAMINOPHEN 500 MG PO TABS: 500.0000 mg | ORAL_TABLET | Freq: Four times a day (QID) | ORAL | Status: DC | PRN

## 2021-09-24 NOTE — Progress Notes (Signed)
Patient recovering status post ERCP. ? ?Tentatively plan laparoscopic cholecystectomy with probable cholangiogram by Dr. Armandina Gemma with the surgical service tomorrow.  Surgery team will reevaluate the morning to see if we can continue that plan. ?

## 2021-09-24 NOTE — H&P (View-Only) (Signed)
Patient recovering status post ERCP. ? ?Tentatively plan laparoscopic cholecystectomy with probable cholangiogram by Dr. Armandina Gemma with the surgical service tomorrow.  Surgery team will reevaluate the morning to see if we can continue that plan. ?

## 2021-09-24 NOTE — Anesthesia Preprocedure Evaluation (Addendum)
Anesthesia Evaluation  ?Patient identified by MRN, date of birth, ID band ?Patient awake ? ? ? ?Reviewed: ?Allergy & Precautions, NPO status , Patient's Chart, lab work & pertinent test results ? ?Airway ?Mallampati: II ? ?TM Distance: >3 FB ?Neck ROM: Full ? ? ? Dental ?no notable dental hx. ?(+) Dental Advisory Given, Teeth Intact,  ?  ?Pulmonary ?Current Smoker and Patient abstained from smoking.,  ?  ?Pulmonary exam normal ?breath sounds clear to auscultation ? ? ? ? ? ? Cardiovascular ?hypertension, Pt. on medications ?Normal cardiovascular exam ?Rhythm:Regular Rate:Normal ? ? ?  ?Neuro/Psych ?  ? GI/Hepatic ?negative GI ROS, Neg liver ROS,   ?Endo/Other  ? ? Renal/GU ?Lab Results ?     Component                Value               Date                 ?     CREATININE               0.59                09/24/2021           ?      K                        3.7                 09/24/2021           ?      ? ?  ?Musculoskeletal ? ? Abdominal ?(+) + obese (BMI 34.70),   ?Peds ? Hematology ? ?(+) Blood dyscrasia, anemia , Lab Results ?     Component                Value               Date                  ?     HGB                      11.7 (L)            09/23/2021           ?     HCT                      36.2                09/23/2021           ?     PLT                      234                 09/23/2021           ?   ?Anesthesia Other Findings ?All: Codeine ? Reproductive/Obstetrics ? ?  ? ? ? ? ? ? ? ? ? ? ? ? ? ?  ?  ? ? ? ? ? ? ? ?Anesthesia Physical ?Anesthesia Plan ? ?ASA: 2 ? ?Anesthesia Plan: General  ? ?Post-op Pain Management: Toradol IV (intra-op)*, Gabapentin PO (pre-op)* and Precedex  ? ?Induction: Intravenous ? ?PONV Risk Score and Plan: Treatment may vary due to age  or medical condition, Ondansetron, Midazolam and Dexamethasone ? ?Airway Management Planned: Oral ETT ? ?Additional Equipment: None ? ?Intra-op Plan:  ? ?Post-operative Plan: Extubation in OR ? ?Informed  Consent: I have reviewed the patients History and Physical, chart, labs and discussed the procedure including the risks, benefits and alternatives for the proposed anesthesia with the patient or authorized representative who has indicated his/her understanding and acceptance.  ? ? ? ?Dental advisory given ? ?Plan Discussed with: CRNA and Anesthesiologist ? ?Anesthesia Plan Comments:   ? ? ? ? ? ?Anesthesia Quick Evaluation ? ?

## 2021-09-24 NOTE — Progress Notes (Signed)
CROSS COVER [facility]GI Subjective: Patient seems to be doing well after ERCP.  She denies having any abdominal pain nausea vomiting.  She had a full regular meal with fish and vegetables last night.  She is awaiting a cholecystectomy depending on surgery schedule.   Objective: Vital signs in last 24 hours: Temp:  [97.5 F (36.4 C)-98.5 F (36.9 C)] 97.7 F (36.5 C) (03/05 0510) Pulse Rate:  [63-65] 63 (03/05 0510) Resp:  [16] 16 (03/05 0510) BP: (135-148)/(69-84) 135/69 (03/05 0510) SpO2:  [95 %-100 %] 95 % (03/05 0510) Last BM Date : 09/22/21  Intake/Output from previous day: 03/04 0701 - 03/05 0700 In: 2491.7 [P.O.:720; I.V.:1671.7; IV Piggyback:100] Out: -  Intake/Output this shift: No intake/output data recorded.  General appearance: alert, cooperative, appears stated age, and morbidly obese Resp: clear to auscultation bilaterally Cardio: regular rate and rhythm, S1, S2 normal, no murmur, click, rub or gallop GI: soft, non-tender; bowel sounds normal; no masses,  no organomegaly  Lab Results: Recent Labs    09/22/21 0718 09/23/21 0438  WBC 5.0 5.1  HGB 14.7 11.7*  HCT 44.4 36.2  PLT 273 234   BMET Recent Labs    09/22/21 0718 09/23/21 0438 09/24/21 0450  NA 137 137 136  K 3.2* 2.8* 3.7  CL 102 107 105  CO2 '26 23 25  '$ GLUCOSE 112* 99 102*  BUN '11 9 9  '$ CREATININE 0.86 0.63 0.59  CALCIUM 9.0 8.3* 8.5*   LFT Recent Labs    09/24/21 0450  PROT 6.5  ALBUMIN 3.3*  AST 59*  ALT 150*  ALKPHOS 112  BILITOT 0.7   PT/INR No results for input(s): LABPROT, INR in the last 72 hours. Hepatitis Panel No results for input(s): HEPBSAG, HCVAB, HEPAIGM, HEPBIGM in the last 72 hours. C-Diff No results for input(s): CDIFFTOX in the last 72 hours. No results for input(s): CDIFFPCR in the last 72 hours. Fecal Lactopherrin No results for input(s): FECLLACTOFRN in the last 72 hours.  Studies/Results: MR 3D Recon At Scanner  Result Date: 09/22/2021 EXAM: MRI ABDOMEN  WITHOUT AND WITH CONTRAST (INCLUDING MRCP) TECHNIQUE: Multiplanar multisequence MR imaging of the abdomen was performed both before and after the administration of intravenous contrast. Heavily T2-weighted images of the biliary and pancreatic ducts were obtained, and three-dimensional MRCP images were rendered by post processing. CONTRAST:  35m GADAVIST GADOBUTROL 1 MMOL/ML IV SOLN COMPARISON:  CT chest angiogram, 03/22/2009 FINDINGS: Lower chest: No acute findings.  Small hiatal hernia. Hepatobiliary: Benign simple cyst of the left lobe of the liver (series 17, image 21). No mass or other parenchymal abnormality identified. Numerous gallstones in the gallbladder, and several small calculi in the gallbladder neck as well as within the proximal cystic duct measuring no greater than 0.3 cm (series 8, image 47). Low insertion of the cystic duct on the common bile duct. The common bile duct measures up to 0.8 cm in caliber. No intrahepatic biliary ductal dilatation. Pancreas: No mass, inflammatory changes, or other parenchymal abnormality identified.No pancreatic ductal dilatation. Spleen:  Within normal limits in size and appearance. Adrenals/Urinary Tract: Stable, definitively benign fat containing left adrenal adenoma (series 14, image 44). No renal masses or suspicious contrast enhancement identified. No evidence of hydronephrosis. Stomach/Bowel: Visualized portions within the abdomen are unremarkable. Vascular/Lymphatic: No pathologically enlarged lymph nodes identified. No abdominal aortic aneurysm demonstrated. Other:  None. Musculoskeletal: No suspicious osseous lesions identified. IMPRESSION: 1. Numerous gallstones in the gallbladder, and several small calculi in the gallbladder neck as well as within  the proximal cystic duct measuring no greater than 0.3 cm. No gallbladder wall thickening or inflammatory findings. 2. The common bile duct measures up to 0.8 cm in caliber, near the upper limit of normal in  caliber, of uncertain significance, however without common bile duct calculus or other obstructing lesion identified to the ampulla. 3. Small hiatal hernia. Electronically Signed   By: Delanna Ahmadi M.D.   On: 09/22/2021 15:24   DG ERCP  Result Date: 09/23/2021 CLINICAL DATA:  Cholelithiasis. EXAM: ERCP TECHNIQUE: Multiple spot images obtained with the fluoroscopic device and submitted for interpretation post-procedure. FLUOROSCOPY TIME: FLUOROSCOPY TIME 2 minutes, 28 seconds (56 mGy) COMPARISON:  MRCP-09/22/2021; right upper quadrant abdominal ultrasound-09/22/2021 FINDINGS: Five spot intraoperative fluoroscopic images the right upper abdominal quadrant during ERCP are provided for review Initial image demonstrates an ERCP probe overlying the right upper abdominal quadrant. There is selective cannulation and opacification of the CBD which appears mildly dilated. There are 2 nonocclusive filling defects within the central aspect of the CBD, potentially representative of air bubbles versus nonocclusive choledocholithiasis. Subsequent images demonstrate insufflation of a balloon within distal aspect of the CBD with presumed biliary sweeping and sphincterotomy. There is minimal opacification of the central aspect of the intrahepatic biliary tree as well as the cystic duct. There is no definitive opacification of the pancreatic duct Completion image does not definitively identify any persistent filling defects within the opacified portion of the biliary tree. IMPRESSION: ERCP with biliary sweeping and presumed sphincterotomy as above. These images were submitted for radiologic interpretation only. Please see the procedural report for the amount of contrast and the fluoroscopy time utilized. Electronically Signed   By: Sandi Mariscal M.D.   On: 09/23/2021 09:38   MR ABDOMEN MRCP W WO CONTAST  Result Date: 09/22/2021 EXAM: MRI ABDOMEN WITHOUT AND WITH CONTRAST (INCLUDING MRCP) TECHNIQUE: Multiplanar multisequence MR  imaging of the abdomen was performed both before and after the administration of intravenous contrast. Heavily T2-weighted images of the biliary and pancreatic ducts were obtained, and three-dimensional MRCP images were rendered by post processing. CONTRAST:  59m GADAVIST GADOBUTROL 1 MMOL/ML IV SOLN COMPARISON:  CT chest angiogram, 03/22/2009 FINDINGS: Lower chest: No acute findings.  Small hiatal hernia. Hepatobiliary: Benign simple cyst of the left lobe of the liver (series 17, image 21). No mass or other parenchymal abnormality identified. Numerous gallstones in the gallbladder, and several small calculi in the gallbladder neck as well as within the proximal cystic duct measuring no greater than 0.3 cm (series 8, image 47). Low insertion of the cystic duct on the common bile duct. The common bile duct measures up to 0.8 cm in caliber. No intrahepatic biliary ductal dilatation. Pancreas: No mass, inflammatory changes, or other parenchymal abnormality identified.No pancreatic ductal dilatation. Spleen:  Within normal limits in size and appearance. Adrenals/Urinary Tract: Stable, definitively benign fat containing left adrenal adenoma (series 14, image 44). No renal masses or suspicious contrast enhancement identified. No evidence of hydronephrosis. Stomach/Bowel: Visualized portions within the abdomen are unremarkable. Vascular/Lymphatic: No pathologically enlarged lymph nodes identified. No abdominal aortic aneurysm demonstrated. Other:  None. Musculoskeletal: No suspicious osseous lesions identified. IMPRESSION: 1. Numerous gallstones in the gallbladder, and several small calculi in the gallbladder neck as well as within the proximal cystic duct measuring no greater than 0.3 cm. No gallbladder wall thickening or inflammatory findings. 2. The common bile duct measures up to 0.8 cm in caliber, near the upper limit of normal in caliber, of uncertain significance, however without common bile  duct calculus or other  obstructing lesion identified to the ampulla. 3. Small hiatal hernia. Electronically Signed   By: Delanna Ahmadi M.D.   On: 09/22/2021 15:24    Medications: I have reviewed the patient's current medications.  Assessment/Plan: Cholelithiasis with biliary pancreatitis which seems to have resolved-awaiting laparoscopic cholecystectomy.  We will sign off now please call if help is needed in the future.  LOS: 2 days   Juanita Craver 09/24/2021, 10:25 AM

## 2021-09-24 NOTE — Progress Notes (Signed)
?PROGRESS NOTE ? ? ?Cassandra Wilkinson  CVE:[id number] DOB: [birth year] DOA: 09/22/2021 ?PCP: [place].  ?Brief Narrative:  ? 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 HTN, BMI 34 with mild obesity ?Presented [facility] ED 3/3 abdominal pain X 4 days-RUQ with radiation + N+ V ?Lipase 2483 AST ALT 332/353 bili 4.4 ?[place] = positive Murphy sign, choledocholithiasis + acute cholecystitis, MRCP showed CBD dilatation ?[place] gastroenterology consulted, general surgery consulted ? ?Underwent ERCP Dr. Benson [place] 3/4 ?Surgery planning lap chole?  09/25/2021 ? ?Hospital-Problem based course ? ?Resolved choledocholithiasis in the setting of ?  Cholecystitis ?ERCP as below ?Empiric ceftriaxone every 24 for now ?Hydromorphone q4 prn 0.5 mg, Compazine 10 q6 prn nausea ?Patient able to tolerate diet at this time no nausea no vomiting and pain is improved ?For lap chole 3/6 ?Severe hypokalemia ?continue D5 with 40 of K for 24 hours ?HTN ?As needed hydralazine 10 every 8-resume lisinopril HCTZ zotepine 10, ? ?DVT prophylaxis: Lovenox ?Code Status: Full ?Family Communication: Discussed with patient alone today ?Disposition:  ?Status is: Inpatient ?Remains inpatient appropriate because:  ? ?Needs lap chole ? ?  ?Consultants:  ?GI ?General surgery ? ?Procedures: ERCP ? ?Impression:               - The major papilla appeared normal. ?                          - A biliary sphincterotomy was performed. ?                          - The biliary tree was swept and nothing was found. ?Moderate Sedation: ?     Not Applicable - Patient had care per Anesthesia. ?Recommendation:           - Return patient to hospital ward for ongoing care. ?                          - Resume regular diet. ?                          - Replete potassium ?                          - Lap chole per Surgery. ? ?Antimicrobials: ?Ceftriaxone ? ? ?Subjective: ? ?No fever chills abd pain ? ?Objective: ?Vitals:  ? 09/23/21 1329 09/23/21 2010 09/24/21 0510 09/24/21 1435  ?BP: (!)  148/81 138/84 135/69 (!) 159/93  ?Pulse: 63 65 63 65  ?Resp: '16 16 16 14  '$ ?Temp: 98.5 ?F (36.9 ?C) (!) 97.5 ?F (36.4 ?C) 97.7 ?F (36.5 ?C) 97.9 ?F (36.6 ?C)  ?TempSrc: Oral Oral Oral Oral  ?SpO2: 100% 100% 95% 100%  ?Weight:      ?Height:      ? ? ?Intake/Output Summary (Last 24 hours) at 09/24/2021 1735 ?Last data filed at 09/24/2021 0511 ?Gross per 24 hour  ?Intake 1496.5 ml  ?Output --  ?Net 1496.5 ml  ? ? ?Filed Weights  ? 09/23/21 0022  ?Weight: 100.5 kg  ? ? ?Examination: ? ?EOMI NCAT no focal deficit ?CTA B-clear ?Abdomen soft nontender in epigastrium ?No lower extremity edema ?Power 5/5 reflexes intact ? ?Data Reviewed: personally reviewed  ? ?CBC ?   ?Component Value Date/Time  ? WBC 5.1 09/23/2021 0438  ? RBC 3.71 (L) 09/23/2021 0438  ?  HGB 11.7 (L) 09/23/2021 0438  ? HCT 36.2 09/23/2021 0438  ? PLT 234 09/23/2021 0438  ? MCV 97.6 09/23/2021 0438  ? MCH 31.5 09/23/2021 0438  ? MCHC 32.3 09/23/2021 0438  ? RDW 15.1 09/23/2021 0438  ? LYMPHSABS 0.8 09/22/2021 0718  ? MONOABS 0.4 09/22/2021 0718  ? EOSABS 0.0 09/22/2021 0718  ? BASOSABS 0.0 09/22/2021 0718  ? ?CMP Latest Ref Rng & Units 09/24/2021 09/23/2021 09/22/2021  ?Glucose 70 - 99 mg/dL 102(H) 99 112(H)  ?BUN 6 - 20 mg/dL '9 9 11  '$ ?Creatinine 0.44 - 1.00 mg/dL 0.59 0.63 0.86  ?Sodium 135 - 145 mmol/L 136 137 137  ?Potassium 3.5 - 5.1 mmol/L 3.7 2.8(L) 3.2(L)  ?Chloride 98 - 111 mmol/L 105 107 102  ?CO2 22 - 32 mmol/L '25 23 26  '$ ?Calcium 8.9 - 10.3 mg/dL 8.5(L) 8.3(L) 9.0  ?Total Protein 6.5 - 8.1 g/dL 6.5 6.5 7.8  ?Total Bilirubin 0.3 - 1.2 mg/dL 0.7 0.8 4.4(H)  ?Alkaline Phos 38 - 126 U/L 112 127(H) 170(H)  ?AST 15 - 41 U/L 59(H) 120(H) 332(H)  ?ALT 0 - 44 U/L 150(H) 212(H) 353(H)  ? ? ? ?Radiology Studies: ?DG ERCP ? ?Result Date: 09/23/2021 ?CLINICAL DATA:  Cholelithiasis. EXAM: ERCP TECHNIQUE: Multiple spot images obtained with the fluoroscopic device and submitted for interpretation post-procedure. FLUOROSCOPY TIME: FLUOROSCOPY TIME 2 minutes, 28 seconds (56  mGy) COMPARISON:  MRCP-09/22/2021; right upper quadrant abdominal ultrasound-09/22/2021 FINDINGS: Five spot intraoperative fluoroscopic images the right upper abdominal quadrant during ERCP are provided for review Initial image demonstrates an ERCP probe overlying the right upper abdominal quadrant. There is selective cannulation and opacification of the CBD which appears mildly dilated. There are 2 nonocclusive filling defects within the central aspect of the CBD, potentially representative of air bubbles versus nonocclusive choledocholithiasis. Subsequent images demonstrate insufflation of a balloon within distal aspect of the CBD with presumed biliary sweeping and sphincterotomy. There is minimal opacification of the central aspect of the intrahepatic biliary tree as well as the cystic duct. There is no definitive opacification of the pancreatic duct Completion image does not definitively identify any persistent filling defects within the opacified portion of the biliary tree. IMPRESSION: ERCP with biliary sweeping and presumed sphincterotomy as above. These images were submitted for radiologic interpretation only. Please see the procedural report for the amount of contrast and the fluoroscopy time utilized. Electronically Signed   By: Sandi Mariscal M.D.   On: 09/23/2021 09:38   ? ? ?Scheduled Meds: ? amLODipine  10 mg Oral q AM  ? enoxaparin (LOVENOX) injection  40 mg Subcutaneous Q24H  ? lisinopril  20 mg Oral BID WC  ? And  ? hydrochlorothiazide  12.5 mg Oral BID WC  ? indomethacin  100 mg Rectal Once  ? polyethylene glycol-electrolytes  4,000 mL Oral Once  ? ?Continuous Infusions: ? cefTRIAXone (ROCEPHIN)  IV 2 g (09/24/21 2094)  ? dextrose 5 % 1,000 mL with potassium chloride 40 mEq infusion 100 mL/hr at 09/24/21 1054  ? ? ? LOS: 2 days  ? ?Time spent: 25 ? ?Nita Sells, MD ?[facility] ?To contact the attending provider between 7A-7P or the covering provider during after hours 7P-7A, please log  into the web site [email or web address] and access using universal [place] password for that web site. If you do not have the password, please call the hospital operator. ? ?09/24/2021, 5:35 PM  ? ? ?

## 2021-09-25 ENCOUNTER — Encounter (HOSPITAL_COMMUNITY): Payer: Self-pay | Admitting: Internal Medicine

## 2021-09-25 ENCOUNTER — Inpatient Hospital Stay (HOSPITAL_COMMUNITY): Payer: 59 | Admitting: Anesthesiology

## 2021-09-25 ENCOUNTER — Other Ambulatory Visit: Payer: Self-pay

## 2021-09-25 ENCOUNTER — Encounter (HOSPITAL_COMMUNITY)
Admission: EM | Disposition: A | Discharge status: Home / Self Care | Payer: Self-pay | Source: Home / Self Care | Attending: Family Medicine

## 2021-09-25 DIAGNOSIS — K851 Biliary acute pancreatitis without necrosis or infection: Secondary | ICD-10-CM

## 2021-09-25 HISTORY — PX: CHOLECYSTECTOMY: SHX55

## 2021-09-25 LAB — SURGICAL PCR SCREEN
MRSA, PCR: NEGATIVE
Staphylococcus aureus: NEGATIVE

## 2021-09-25 SURGERY — LAPAROSCOPIC CHOLECYSTECTOMY
Anesthesia: General | Site: Abdomen

## 2021-09-25 SURGERY — COLONOSCOPY WITH PROPOFOL
Anesthesia: Monitor Anesthesia Care

## 2021-09-25 MED ORDER — MIDAZOLAM HCL 5 MG/5ML IJ SOLN
INTRAMUSCULAR | Status: DC | PRN
  Administered 2021-09-25 (×2): 1 mg via INTRAVENOUS

## 2021-09-25 MED ORDER — HYDROMORPHONE HCL 1 MG/ML IJ SOLN: 0.2500 mg | INTRAMUSCULAR | Status: DC | PRN

## 2021-09-25 MED ORDER — MUPIROCIN 2 % EX OINT: 1.0000 "application " | TOPICAL_OINTMENT | Freq: Two times a day (BID) | CUTANEOUS | Status: DC

## 2021-09-25 MED ORDER — LACTATED RINGERS IR SOLN
Status: DC | PRN
  Administered 2021-09-25: 1000 mL

## 2021-09-25 MED ORDER — ACETAMINOPHEN 500 MG PO TABS
1000.0000 mg | ORAL_TABLET | Freq: Four times a day (QID) | ORAL | Status: DC
  Administered 2021-09-25 (×3): 1000 mg via ORAL
  Filled 2021-09-25 (×4): qty 2

## 2021-09-25 MED ORDER — DEXMEDETOMIDINE (PRECEDEX) IN NS 20 MCG/5ML (4 MCG/ML) IV SYRINGE
PREFILLED_SYRINGE | INTRAVENOUS | Status: DC | PRN
  Administered 2021-09-25: 8 ug via INTRAVENOUS
  Administered 2021-09-25: 4 ug via INTRAVENOUS

## 2021-09-25 MED ORDER — DEXAMETHASONE SODIUM PHOSPHATE 10 MG/ML IJ SOLN
INTRAMUSCULAR | Status: DC | PRN
  Administered 2021-09-25: 10 mg via INTRAVENOUS

## 2021-09-25 MED ORDER — GABAPENTIN 300 MG PO CAPS
300.0000 mg | ORAL_CAPSULE | ORAL | Status: AC
  Administered 2021-09-25: 300 mg via ORAL
  Filled 2021-09-25: qty 1

## 2021-09-25 MED ORDER — ACETAMINOPHEN 325 MG PO TABS: 650.0000 mg | ORAL_TABLET | Freq: Four times a day (QID) | ORAL | Status: DC | PRN

## 2021-09-25 MED ORDER — ENSURE PRE-SURGERY PO LIQD: 296.0000 mL | Freq: Once | ORAL | Status: DC

## 2021-09-25 MED ORDER — SUGAMMADEX SODIUM 200 MG/2ML IV SOLN
INTRAVENOUS | Status: DC | PRN
  Administered 2021-09-25: 200 mg via INTRAVENOUS

## 2021-09-25 MED ORDER — PROPOFOL 10 MG/ML IV BOLUS
INTRAVENOUS | Status: AC
  Filled 2021-09-25: qty 20

## 2021-09-25 MED ORDER — ROCURONIUM BROMIDE 100 MG/10ML IV SOLN
INTRAVENOUS | Status: DC | PRN
Start: 2021-09-25 — End: 2021-09-25
  Administered 2021-09-25: 60 mg via INTRAVENOUS

## 2021-09-25 MED ORDER — KETOROLAC TROMETHAMINE 30 MG/ML IJ SOLN: 30.0000 mg | Freq: Once | INTRAMUSCULAR | Status: DC | PRN

## 2021-09-25 MED ORDER — OXYCODONE HCL 5 MG PO TABS: 5.0000 mg | ORAL_TABLET | Freq: Once | ORAL | Status: DC | PRN

## 2021-09-25 MED ORDER — ONDANSETRON HCL 4 MG/2ML IJ SOLN
INTRAMUSCULAR | Status: DC | PRN
  Administered 2021-09-25: 4 mg via INTRAVENOUS

## 2021-09-25 MED ORDER — ROCURONIUM BROMIDE 10 MG/ML (PF) SYRINGE
PREFILLED_SYRINGE | INTRAVENOUS | Status: AC
  Filled 2021-09-25: qty 10

## 2021-09-25 MED ORDER — OXYCODONE HCL 5 MG PO TABS: 5.0000 mg | ORAL_TABLET | ORAL | Status: DC | PRN

## 2021-09-25 MED ORDER — HYDROMORPHONE HCL 1 MG/ML IJ SOLN: 1.0000 mg | INTRAMUSCULAR | Status: DC | PRN

## 2021-09-25 MED ORDER — ACETAMINOPHEN 500 MG PO TABS
1000.0000 mg | ORAL_TABLET | ORAL | Status: AC
  Administered 2021-09-25: 1000 mg via ORAL
  Filled 2021-09-25: qty 2

## 2021-09-25 MED ORDER — ONDANSETRON HCL 4 MG/2ML IJ SOLN: 4.0000 mg | Freq: Once | INTRAMUSCULAR | Status: DC | PRN

## 2021-09-25 MED ORDER — CELECOXIB 200 MG PO CAPS
200.0000 mg | ORAL_CAPSULE | ORAL | Status: AC
  Administered 2021-09-25: 200 mg via ORAL
  Filled 2021-09-25: qty 1

## 2021-09-25 MED ORDER — ACETAMINOPHEN 650 MG RE SUPP: 650.0000 mg | Freq: Four times a day (QID) | RECTAL | Status: DC | PRN

## 2021-09-25 MED ORDER — ONDANSETRON HCL 4 MG/2ML IJ SOLN: 4.0000 mg | Freq: Four times a day (QID) | INTRAMUSCULAR | Status: DC | PRN

## 2021-09-25 MED ORDER — FENTANYL CITRATE (PF) 100 MCG/2ML IJ SOLN
INTRAMUSCULAR | Status: DC | PRN
  Administered 2021-09-25 (×3): 50 ug via INTRAVENOUS
  Administered 2021-09-25: 25 ug via INTRAVENOUS
  Administered 2021-09-25: 50 ug via INTRAVENOUS
  Administered 2021-09-25: 25 ug via INTRAVENOUS

## 2021-09-25 MED ORDER — EPHEDRINE 5 MG/ML INJ
INTRAVENOUS | Status: AC
  Filled 2021-09-25: qty 5

## 2021-09-25 MED ORDER — BUPIVACAINE LIPOSOME 1.3 % IJ SUSP: 20.0000 mL | Freq: Once | INTRAMUSCULAR | Status: DC

## 2021-09-25 MED ORDER — FENTANYL CITRATE (PF) 250 MCG/5ML IJ SOLN
INTRAMUSCULAR | Status: AC
  Filled 2021-09-25: qty 5

## 2021-09-25 MED ORDER — TRAMADOL HCL 50 MG PO TABS: 50.0000 mg | ORAL_TABLET | Freq: Four times a day (QID) | ORAL | Status: DC | PRN

## 2021-09-25 MED ORDER — BUPIVACAINE-EPINEPHRINE 0.5% -1:200000 IJ SOLN
INTRAMUSCULAR | Status: DC | PRN
  Administered 2021-09-25: 30 mL

## 2021-09-25 MED ORDER — PROPOFOL 10 MG/ML IV BOLUS
INTRAVENOUS | Status: DC | PRN
  Administered 2021-09-25: 170 mg via INTRAVENOUS

## 2021-09-25 MED ORDER — CHLORHEXIDINE GLUCONATE CLOTH 2 % EX PADS
6.0000 | MEDICATED_PAD | Freq: Once | CUTANEOUS | Status: AC
  Administered 2021-09-25: 6 via TOPICAL

## 2021-09-25 MED ORDER — EPHEDRINE SULFATE (PRESSORS) 50 MG/ML IJ SOLN
INTRAMUSCULAR | Status: DC | PRN
  Administered 2021-09-25: 10 mg via INTRAVENOUS

## 2021-09-25 MED ORDER — ONDANSETRON HCL 4 MG/2ML IJ SOLN
INTRAMUSCULAR | Status: AC
  Filled 2021-09-25: qty 2

## 2021-09-25 MED ORDER — DEXMEDETOMIDINE (PRECEDEX) IN NS 20 MCG/5ML (4 MCG/ML) IV SYRINGE
PREFILLED_SYRINGE | INTRAVENOUS | Status: AC
  Filled 2021-09-25: qty 5

## 2021-09-25 MED ORDER — ONDANSETRON 4 MG PO TBDP: 4.0000 mg | ORAL_TABLET | Freq: Four times a day (QID) | ORAL | Status: DC | PRN

## 2021-09-25 MED ORDER — SODIUM CHLORIDE 0.9 % IV SOLN
2.0000 g | INTRAVENOUS | Status: AC
  Administered 2021-09-25: 2 g via INTRAVENOUS

## 2021-09-25 MED ORDER — LACTATED RINGERS IV SOLN: INTRAVENOUS | Status: DC

## 2021-09-25 MED ORDER — SODIUM CHLORIDE 0.45 % IV SOLN: INTRAVENOUS | Status: DC

## 2021-09-25 MED ORDER — MIDAZOLAM HCL 2 MG/2ML IJ SOLN
INTRAMUSCULAR | Status: AC
  Filled 2021-09-25: qty 2

## 2021-09-25 MED ORDER — OXYCODONE HCL 5 MG/5ML PO SOLN: 5.0000 mg | Freq: Once | ORAL | Status: DC | PRN

## 2021-09-25 MED ORDER — BUPIVACAINE-EPINEPHRINE (PF) 0.5% -1:200000 IJ SOLN
INTRAMUSCULAR | Status: AC
  Filled 2021-09-25: qty 30

## 2021-09-25 MED ORDER — CHLORHEXIDINE GLUCONATE CLOTH 2 % EX PADS: 6.0000 | MEDICATED_PAD | Freq: Once | CUTANEOUS | Status: DC

## 2021-09-25 MED ORDER — DEXAMETHASONE SODIUM PHOSPHATE 10 MG/ML IJ SOLN
INTRAMUSCULAR | Status: AC
  Filled 2021-09-25: qty 1

## 2021-09-25 MED ORDER — LIDOCAINE HCL (CARDIAC) PF 100 MG/5ML IV SOSY
PREFILLED_SYRINGE | INTRAVENOUS | Status: DC | PRN
  Administered 2021-09-25: 60 mg via INTRAVENOUS

## 2021-09-25 SURGICAL SUPPLY — 39 items
APL PRP STRL LF DISP 70% ISPRP (MISCELLANEOUS) ×1
APPLIER CLIP ROT 10 11.4 M/L (STAPLE) ×2
APR CLP MED LRG 11.4X10 (STAPLE) ×1
BAG COUNTER SPONGE SURGICOUNT (BAG) IMPLANT
BAG SPEC RTRVL LRG 6X4 10 (ENDOMECHANICALS) ×1
BAG SPNG CNTER NS LX DISP (BAG)
CABLE HIGH FREQUENCY MONO STRZ (ELECTRODE) ×2 IMPLANT
CHLORAPREP W/TINT 26 (MISCELLANEOUS) ×2 IMPLANT
CLIP APPLIE ROT 10 11.4 M/L (STAPLE) ×1 IMPLANT
COVER MAYO STAND STRL (DRAPES) IMPLANT
COVER SURGICAL LIGHT HANDLE (MISCELLANEOUS) ×2 IMPLANT
DRAPE C-ARM 42X120 X-RAY (DRAPES) IMPLANT
ELECT REM PT RETURN 15FT ADLT (MISCELLANEOUS) ×2 IMPLANT
GLOVE SURG ORTHO LTX SZ8 (GLOVE) ×2 IMPLANT
GLOVE SURG SYN 7.5  E (GLOVE) ×2
GLOVE SURG SYN 7.5 E (GLOVE) ×1 IMPLANT
GLOVE SURG SYN 7.5 PF PI (GLOVE) ×1 IMPLANT
GOWN STRL REUS W/TWL XL LVL3 (GOWN DISPOSABLE) ×4 IMPLANT
HEMOSTAT SURGICEL 4X8 (HEMOSTASIS) IMPLANT
IRRIG SUCT STRYKERFLOW 2 WTIP (MISCELLANEOUS) ×2
IRRIGATION SUCT STRKRFLW 2 WTP (MISCELLANEOUS) ×1 IMPLANT
KIT BASIN OR (CUSTOM PROCEDURE TRAY) ×2 IMPLANT
KIT TURNOVER KIT A (KITS) IMPLANT
PAD POSITIONING PINK XL (MISCELLANEOUS) IMPLANT
PENCIL SMOKE EVACUATOR (MISCELLANEOUS) IMPLANT
POUCH SPECIMEN RETRIEVAL 10MM (ENDOMECHANICALS) ×2 IMPLANT
SCISSORS LAP 5X35 DISP (ENDOMECHANICALS) ×2 IMPLANT
SET CHOLANGIOGRAPH MIX (MISCELLANEOUS) ×2 IMPLANT
SET TUBE SMOKE EVAC HIGH FLOW (TUBING) ×2 IMPLANT
SLEEVE XCEL OPT CAN 5 100 (ENDOMECHANICALS) ×2 IMPLANT
SPIKE FLUID TRANSFER (MISCELLANEOUS) ×2 IMPLANT
STRIP CLOSURE SKIN 1/2X4 (GAUZE/BANDAGES/DRESSINGS) ×2 IMPLANT
SUT VIC AB 4-0 PS2 27 (SUTURE) ×2 IMPLANT
TAPE CLOTH 4X10 WHT NS (GAUZE/BANDAGES/DRESSINGS) IMPLANT
TOWEL OR 17X26 10 PK STRL BLUE (TOWEL DISPOSABLE) ×2 IMPLANT
TRAY LAPAROSCOPIC (CUSTOM PROCEDURE TRAY) ×2 IMPLANT
TROCAR BLADELESS OPT 5 100 (ENDOMECHANICALS) ×2 IMPLANT
TROCAR XCEL BLUNT TIP 100MML (ENDOMECHANICALS) ×2 IMPLANT
TROCAR XCEL NON-BLD 11X100MML (ENDOMECHANICALS) ×2 IMPLANT

## 2021-09-25 NOTE — Anesthesia Procedure Notes (Signed)
Procedure Name: Intubation ?Date/Time: 09/25/2021 9:57 AM ?Performed by: Garrel Ridgel, CRNA ?Pre-anesthesia Checklist: Patient identified, Emergency Drugs available, Suction available and Patient being monitored ?Patient Re-evaluated:Patient Re-evaluated prior to induction ?Oxygen Delivery Method: Circle system utilized ?Preoxygenation: Pre-oxygenation with 100% oxygen ?Induction Type: IV induction ?Ventilation: Mask ventilation without difficulty ?Laryngoscope Size: Mac and 3 ?Grade View: Grade II ?Tube type: Oral ?Tube size: 7.0 mm ?Number of attempts: 1 ?Airway Equipment and Method: Stylet and Oral airway ?Placement Confirmation: ETT inserted through vocal cords under direct vision, positive ETCO2 and breath sounds checked- equal and bilateral ?Secured at: 23 cm ?Tube secured with: Tape ?Dental Injury: Teeth and Oropharynx as per pre-operative assessment  ? ? ? ? ?

## 2021-09-25 NOTE — Op Note (Addendum)
Procedure Note ? ?Pre-operative Diagnosis:  biliary pancreatitis, cholelithiasis ? ?Post-operative Diagnosis:  same ? ?Surgeon:  Armandina Gemma, MD ? ?Assistant:  Earnie Larsson, MD ([facility] Resident)  ? ?Procedure:  Laparoscopic cholecystectomy ? ?I was personally present during the key and critical portions of this procedure and immediately available throughout the entire procedure, as documented in my operative note.  ? ?Anesthesia:  General ? ?Estimated Blood Loss:  minimal ? ?Drains: none ?        ?Specimen: gallbladder to pathology ? ?Indications:  patient is a 61 yo female admitted to the medical service with biliary pancreatitis.  Patient underwent ERCP by Dr. Carol Ada on 3/5.  She now comes to surgery for cholecystectomy. ? ?Procedure description: The patient was seen in the pre-op holding area. The risks, benefits, complications, treatment options, and expected outcomes were previously discussed with the patient. The patient agreed with the proposed plan and has signed the informed consent form. ? ?The patient was transported to operating room #4 at the [facility]. The patient was placed in the supine position on the operating room table. Following induction of general anesthesia, the abdomen was prepped and draped in the usual aseptic fashion. ? ?An incision was made in the skin near the umbilicus. The midline fascia was incised and the peritoneal cavity was entered and a Hasson cannula was introduced under direct vision. The cannula was secured with a 0-Vicryl pursestring suture. Pneumoperitoneum was established with carbon dioxide. Additional cannulae were introduced under direct vision along the right costal margin in the midline, mid-clavicular line, and anterior axillary line. ?  ?The gallbladder was identified and the fundus grasped and retracted cephalad. Adhesions were taken down bluntly and the electrocautery was utilized as needed, taking care not to involve any adjacent structures. The  infundibulum was grasped and retracted laterally, exposing the peritoneum overlying the triangle of Calot. The peritoneum was incised and structures exposed with blunt dissection. The cystic duct was clearly identified, bluntly dissected circumferentially, and clipped at the neck of the gallbladder.  The cystic duct was then doubly ligated with ligaclips and divided. The cystic artery was identified, dissected circumferentially, ligated with ligaclips, and divided. ? ?The gallbladder was dissected away from the gallbladder bed using the electrocautery for hemostasis. The gallbladder was completely removed from the liver and placed into an endocatch bag. A few stones required retrieval with the stone scoop forceps.  The gallbladder was removed in the endocatch bag through the umbilical port site and submitted to pathology for review. ? ?The right upper quadrant was irrigated and the gallbladder bed was inspected. Hemostasis was achieved with the electrocautery. ? ?Cannulae were removed under direct vision and good hemostasis was noted. Pneumoperitoneum was released and the majority of the carbon dioxide evacuated. The umbilical wound was irrigated and the fascia was then closed with the pursestring suture.  Local anesthetic was infiltrated at all port sites. Skin incisions were closed with 4-0 Monocril subcuticular sutures and Dermabond was applied. ? ?Instrument, sponge, and needle counts were correct at the conclusion of the case.  The patient was awakened from anesthesia and brought to the recovery room in stable condition.  The patient tolerated the procedure well. ? ? ?Armandina Gemma, MD ?[facility] ?Office: [phone] ?  ?

## 2021-09-25 NOTE — Anesthesia Postprocedure Evaluation (Signed)
Anesthesia Post Note ? ?Patient: Cassandra Wilkinson ? ?Procedure(s) Performed: LAPAROSCOPIC CHOLECYSTECTOMY (Abdomen) ? ?  ? ?Patient location during evaluation: PACU ?Anesthesia Type: General ?Level of consciousness: awake and alert ?Pain management: pain level controlled ?Vital Signs Assessment: post-procedure vital signs reviewed and stable ?Respiratory status: spontaneous breathing, nonlabored ventilation, respiratory function stable and patient connected to nasal cannula oxygen ?Cardiovascular status: blood pressure returned to baseline and stable ?Postop Assessment: no apparent nausea or vomiting ?Anesthetic complications: no ? ? ?No notable events documented. ? ?Last Vitals:  ?Vitals:  ? 09/25/21 1130 09/25/21 1154  ?BP: 138/74 (!) 148/87  ?Pulse: 72 (!) 58  ?Resp: 18 18  ?Temp:  36.5 ?C  ?SpO2: 100% 97%  ?  ?Last Pain:  ?Vitals:  ? 09/25/21 1154  ?TempSrc: Oral  ?PainSc:   ? ? ?  ?  ?  ?  ?  ?  ? ?[place] ? ? ? ? ?

## 2021-09-25 NOTE — Interval H&P Note (Signed)
History and Physical Interval Note: ? ?09/25/2021 ?9:00 AM ? ?Cassandra Wilkinson  has presented today for surgery, with the diagnosis of Gallstone pancreatitis.  The various methods of treatment have been discussed with the patient and family. After consideration of risks, benefits and other options for treatment, the patient has consented to  ? ? Procedure(s): ?LAPAROSCOPIC CHOLECYSTECTOMY (N/A) as a surgical intervention.   ? ?The patient's history has been reviewed, patient examined, no change in status, stable for surgery.  I have reviewed the patient's chart and labs.  Questions were answered to the patient's satisfaction.   ? ?Cassandra Gemma, MD ?[facility] ?A [facility] practice ?Office: [phone] ? ? ?Cassandra Wilkinson ? ? ?

## 2021-09-25 NOTE — Progress Notes (Signed)
?PROGRESS NOTE ? ? ?Cassandra Wilkinson  OEH:[id number] DOB: 1961/04/03 DOA: 09/22/2021 ?PCP: [place].  ?Brief Narrative:  ? 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 ?Underwent ERCP Dr. Benson [place] 3/4 ? Patient underwent lap chole 09/25/2021 ? ?Hospital-Problem based course ? ?Resolved choledocholithiasis in the setting of ?  Cholecystitis ?ERCP as below ?Empiric ceftriaxone every 24 for now ?Dilaudid 1 mg every 2 as needed, Oxy IR 5-10 every 4 as needed, tramadol 50 every 6 as needed Compazine 10 q6 prn nausea ?Patient able to tolerate CLD and grad diet as per surg ?Gb related pancreatitis ?Patient underwent lap chole 3/6 successfully--defer postop management to general surgery-?  Able to discharge a.m. ?Severe hypokalemia ?Recheck labs 3/7, continue 0.45 saline at this time ?HTN ?As needed hydralazine 10 every 8-resume lisinopril HCTZ amlodipine 10, ? ?DVT prophylaxis: Lovenox ?Code Status: Full ?Family Communication: Discussed with patient alone today ?Disposition:  ?Status is: Inpatient ?Remains inpatient appropriate because:  ? ?Await postop management by general surgery and stability prior to discharge ? ?  ?Consultants:  ?GI ?General surgery ? ?Procedures: ERCP ? ?Impression:               - The major papilla appeared normal. ?                          - A biliary sphincterotomy was performed. ?                          - The biliary tree was swept and nothing was found. ?Moderate Sedation: ?     Not Applicable - Patient had care per Anesthesia. ?Recommendation:           - Return patient to hospital ward for ongoing care. ?                          - Resume regular diet. ?                          - Replete  potassium ?                          - Lap chole per Surgery. ? ?Antimicrobials: ?Ceftriaxone  ? ?Subjective: ? ?just back from OR and doing fair ?She is having some sips of fluid ?No chest pain no fever ? ?Objective: ?Vitals:  ? 09/25/21 1105 09/25/21 1115 09/25/21 1130 09/25/21 1154  ?BP:  131/69 138/74 (!) 148/87  ?Pulse:  (!) 56 72 (!) 58  ?Resp:  '14 18 18  '$ ?Temp:    97.7 ?F (36.5 ?C)  ?TempSrc:    Oral  ?SpO2: 100% 100% 100% 97%  ?Weight:      ?Height:      ? ? ?Intake/Output Summary (Last 24 hours) at 09/25/2021 1537 ?Last data filed at 09/25/2021 1400 ?Gross per 24 hour  ?Intake 3459.43 ml  ?Output 5 ml  ?Net 3454.43 ml  ? ? ?Filed Weights  ? 09/23/21 0022  ?Weight: 100.5 kg  ? ? ?Examination: ? ?EOMI NCAT no focal deficit no icterus no  pallor ?CTA B-clear ?Abdomen soft nontender in epigastrium with postoperative changes now ?No lower extremity edema ?Power 5/5 reflexes intact ? ?Data Reviewed: personally reviewed  ? ?CBC ?   ?Component Value Date/Time  ? WBC 5.1 09/23/2021 0438  ? RBC 3.71 (L) 09/23/2021 0438  ? HGB 11.7 (L) 09/23/2021 0438  ? HCT 36.2 09/23/2021 0438  ? PLT 234 09/23/2021 0438  ? MCV 97.6 09/23/2021 0438  ? MCH 31.5 09/23/2021 0438  ? MCHC 32.3 09/23/2021 0438  ? RDW 15.1 09/23/2021 0438  ? LYMPHSABS 0.8 09/22/2021 0718  ? MONOABS 0.4 09/22/2021 0718  ? EOSABS 0.0 09/22/2021 0718  ? BASOSABS 0.0 09/22/2021 0718  ? ?CMP Latest Ref Rng & Units 09/24/2021 09/23/2021 09/22/2021  ?Glucose 70 - 99 mg/dL 102(H) 99 112(H)  ?BUN 6 - 20 mg/dL '9 9 11  '$ ?Creatinine 0.44 - 1.00 mg/dL 0.59 0.63 0.86  ?Sodium 135 - 145 mmol/L 136 137 137  ?Potassium 3.5 - 5.1 mmol/L 3.7 2.8(L) 3.2(L)  ?Chloride 98 - 111 mmol/L 105 107 102  ?CO2 22 - 32 mmol/L '25 23 26  '$ ?Calcium 8.9 - 10.3 mg/dL 8.5(L) 8.3(L) 9.0  ?Total Protein 6.5 - 8.1 g/dL 6.5 6.5 7.8  ?Total Bilirubin 0.3 - 1.2 mg/dL 0.7 0.8 4.4(H)  ?Alkaline Phos 38 - 126 U/L 112 127(H) 170(H)  ?AST 15 - 41 U/L 59(H) 120(H) 332(H)  ?ALT 0 - 44 U/L 150(H) 212(H) 353(H)   ? ? ? ?Radiology Studies: ?No results found. ? ? ?Scheduled Meds: ? acetaminophen  1,000 mg Oral Q6H  ? ?Continuous Infusions: ? sodium chloride 50 mL/hr at 09/25/21 1155  ? ? ? LOS: 3 days  ? ?Time spent: 25 ? ?Nita Sells, MD ?[facility] ?To contact the attending provider between 7A-7P or the covering provider during after hours 7P-7A, please log into the web site [email or web address] and access using universal [place] password for that web site. If you do not have the password, please call the hospital operator. ? ?09/25/2021, 3:37 PM  ? ? ?

## 2021-09-25 NOTE — Transfer of Care (Signed)
Immediate Anesthesia Transfer of Care Note ? ?Patient: Cassandra Wilkinson ? ?Procedure(s) Performed: LAPAROSCOPIC CHOLECYSTECTOMY (Abdomen) ? ?Patient Location: PACU ? ?Anesthesia Type:General ? ?Level of Consciousness: awake, alert , oriented and patient cooperative ? ?Airway & Oxygen Therapy: Patient Spontanous Breathing and Patient connected to face mask oxygen ? ?Post-op Assessment: Report given to RN and Post -op Vital signs reviewed and stable ? ?Post vital signs: Reviewed and stable ? ?Last Vitals:  ?Vitals Value Taken Time  ?BP 148/71 09/25/21 1102  ?Temp    ?Pulse 58 09/25/21 1104  ?Resp 14 09/25/21 1104  ?SpO2 100 % 09/25/21 1104  ?Vitals shown include unvalidated device data. ? ?Last Pain:  ?Vitals:  ? 09/25/21 0828  ?TempSrc: Oral  ?PainSc:   ?   ? ?Patients Stated Pain Goal: 0 (09/24/21 1058) ? ?Complications: No notable events documented. ?

## 2021-09-25 NOTE — Discharge Instructions (Signed)
[facility] ° °Please arrive at least 30 min before your appointment to complete your check in paperwork.  If you are unable to arrive 30 min prior to your appointment time we may have to cancel or reschedule you. °LAPAROSCOPIC SURGERY: POST OP INSTRUCTIONS °Always review your discharge instruction sheet given to you by the facility where your surgery was performed. °IF YOU HAVE DISABILITY OR FAMILY LEAVE FORMS, YOU MUST BRING THEM TO THE OFFICE FOR PROCESSING.   °DO NOT GIVE THEM TO YOUR DOCTOR. ° °PAIN CONTROL ° °First take acetaminophen (Tylenol) AND/or ibuprofen (Advil) to control your pain after surgery.  Follow directions on package.  Taking acetaminophen (Tylenol) and/or ibuprofen (Advil) regularly after surgery will help to control your pain and lower the amount of prescription pain medication you may need.  You should not take more than 4,000 mg (4 grams) of acetaminophen (Tylenol) in 24 hours.  You should not take ibuprofen (Advil), aleve, motrin, naprosyn or other NSAIDS if you have a history of stomach ulcers or chronic kidney disease.  °A prescription for pain medication may be given to you upon discharge.  Take your pain medication as prescribed, if you still have uncontrolled pain after taking acetaminophen (Tylenol) or ibuprofen (Advil). °Use ice packs to help control pain. °If you need a refill on your pain medication, please contact your pharmacy.  They will contact our office to request authorization. Prescriptions will not be filled after 5pm or on week-ends. ° °HOME MEDICATIONS °Take your usually prescribed medications unless otherwise directed. ° °DIET °You should follow a light diet the first few days after arrival home.  Be sure to include lots of fluids daily. Avoid fatty, fried foods.  ° °CONSTIPATION °It is common to experience some constipation after surgery and if you are taking pain medication.  Increasing fluid intake and taking a stool softener (such as Colace)  will usually help or prevent this problem from occurring.  A mild laxative (Milk of Magnesia or Miralax) should be taken according to package instructions if there are no bowel movements after 48 hours. ° °WOUND/INCISION CARE °Most patients will experience some swelling and bruising in the area of the incisions.  Ice packs will help.  Swelling and bruising can take several days to resolve.  °Unless discharge instructions indicate otherwise, follow guidelines below  °STERI-STRIPS - you may remove your outer bandages 48 hours after surgery, and you may shower at that time.  You have steri-strips (small skin tapes) in place directly over the incision.  These strips should be left on the skin for 7-10 days.   °DERMABOND/SKIN GLUE - you may shower in 24 hours.  The glue will flake off over the next 2-3 weeks. °Any sutures or staples will be removed at the office during your follow-up visit. ° °ACTIVITIES °You may resume regular (light) daily activities beginning the next day--such as daily self-care, walking, climbing stairs--gradually increasing activities as tolerated.  You may have sexual intercourse when it is comfortable.  Refrain from any heavy lifting or straining until approved by your doctor. °You may drive when you are no longer taking prescription pain medication, you can comfortably wear a seatbelt, and you can safely maneuver your car and apply brakes. ° °FOLLOW-UP °You should see your doctor in the office for a follow-up appointment approximately 2-3 weeks after your surgery.  You should have been given your post-op/follow-up appointment when your surgery was scheduled.  If you did not receive a post-op/follow-up appointment, make sure   that you call for this appointment within a day or two after you arrive home to insure a convenient appointment time. ° ° °WHEN TO CALL YOUR DOCTOR: °Fever over 101.0 °Inability to urinate °Continued bleeding from incision. °Increased pain, redness, or drainage from the  incision. °Increasing abdominal pain ° °The clinic staff is available to answer your questions during regular business hours.  Please don’t hesitate to call and ask to speak to one of the nurses for clinical concerns.  If you have a medical emergency, go to the nearest emergency room or call 911.  A surgeon from [facility] is always on call at the hospital. °[address], [address], [place], [place]  [address] ? [address] [address], [place], [place]   [address] °[phone] ? [phone] ? FAX [phone] ° ° ° ° °Managing Your Pain After Surgery Without Opioids ° ° ° °Thank you for participating in our program to help patients manage their pain after surgery without opioids. This is part of our effort to provide you with the best care possible, without exposing you or your family to the risk that opioids pose. ° °What pain can I expect after surgery? °You can expect to have some pain after surgery. This is normal. The pain is typically worse the day after surgery, and quickly begins to get better. °Many studies have found that many patients are able to manage their pain after surgery with Over-the-Counter (OTC) medications such as Tylenol and Motrin. If you have a condition that does not allow you to take Tylenol or Motrin, notify your surgical team. ° °How will I manage my pain? °The best strategy for controlling your pain after surgery is around the clock pain control with Tylenol (acetaminophen) and Motrin (ibuprofen or Advil). Alternating these medications with each other allows you to maximize your pain control. In addition to Tylenol and Motrin, you can use heating pads or ice packs on your incisions to help reduce your pain. ° °How will I alternate your regular strength over-the-counter pain medication? °You will take a dose of pain medication every three hours. °Start by taking 650 mg of Tylenol (2 pills of 325 mg) °3 hours later take 600 mg of Motrin (3 pills of 200 mg) °3 hours after  taking the Motrin take 650 mg of Tylenol °3 hours after that take 600 mg of Motrin. ° ° °- 1 - ° °See example - if your first dose of Tylenol is at 12:00 PM ° ° °12:00 PM Tylenol 650 mg (2 pills of 325 mg)  °3:00 PM Motrin 600 mg (3 pills of 200 mg)  °6:00 PM Tylenol 650 mg (2 pills of 325 mg)  °9:00 PM Motrin 600 mg (3 pills of 200 mg)  °Continue alternating every 3 hours  ° °We recommend that you follow this schedule around-the-clock for at least 3 days after surgery, or until you feel that it is no longer needed. Use the table on the last page of this handout to keep track of the medications you are taking. °Important: °Do not take more than 3000mg of Tylenol or 3200mg of Motrin in a 24-hour period. °Do not take ibuprofen/Motrin if you have a history of bleeding stomach ulcers, severe kidney disease, &/or actively taking a blood thinner ° °What if I still have pain? °If you have pain that is not controlled with the over-the-counter pain medications (Tylenol and Motrin or Advil) you might have what we call “breakthrough” pain. You will receive a prescription   for a small amount of an opioid pain medication such as Oxycodone, Tramadol, or Tylenol with Codeine. Use these opioid pills in the first 24 hours after surgery if you have breakthrough pain. Do not take more than 1 pill every 4-6 hours. ° °If you still have uncontrolled pain after using all opioid pills, don't hesitate to call our staff using the number provided. We will help make sure you are managing your pain in the best way possible, and if necessary, we can provide a prescription for additional pain medication. ° ° °Day 1   ° °Time  °Name of Medication Number of pills taken  °Amount of Acetaminophen  °Pain Level  ° °Comments  °AM PM       °AM PM       °AM PM       °AM PM       °AM PM       °AM PM       °AM PM       °AM PM       °Total Daily amount of Acetaminophen °Do not take more than  3,000 mg per day    ° ° °Day 2   ° °Time  °Name of Medication  Number of pills °taken  °Amount of Acetaminophen  °Pain Level  ° °Comments  °AM PM       °AM PM       °AM PM       °AM PM       °AM PM       °AM PM       °AM PM       °AM PM       °Total Daily amount of Acetaminophen °Do not take more than  3,000 mg per day    ° ° °Day 3   ° °Time  °Name of Medication Number of pills taken  °Amount of Acetaminophen  °Pain Level  ° °Comments  °AM PM       °AM PM       °AM PM       °AM PM       ° ° ° °AM PM       °AM PM       °AM PM       °AM PM       °Total Daily amount of Acetaminophen °Do not take more than  3,000 mg per day    ° ° °Day 4   ° °Time  °Name of Medication Number of pills taken  °Amount of Acetaminophen  °Pain Level  ° °Comments  °AM PM       °AM PM       °AM PM       °AM PM       °AM PM       °AM PM       °AM PM       °AM PM       °Total Daily amount of Acetaminophen °Do not take more than  3,000 mg per day    ° ° °Day 5   ° °Time  °Name of Medication Number °of pills taken  °Amount of Acetaminophen  °Pain Level  ° °Comments  °AM PM       °AM PM       °AM PM       °AM PM       °AM PM       °AM   PM       °AM PM       °AM PM       °Total Daily amount of Acetaminophen °Do not take more than  3,000 mg per day    ° ° ° °Day 6   ° °Time  °Name of Medication Number of pills °taken  °Amount of Acetaminophen  °Pain Level  °Comments  °AM PM       °AM PM       °AM PM       °AM PM       °AM PM       °AM PM       °AM PM       °AM PM       °Total Daily amount of Acetaminophen °Do not take more than  3,000 mg per day    ° ° °Day 7   ° °Time  °Name of Medication Number of pills taken  °Amount of Acetaminophen  °Pain Level  ° °Comments  °AM PM       °AM PM       °AM PM       °AM PM       °AM PM       °AM PM       °AM PM       °AM PM       °Total Daily amount of Acetaminophen °Do not take more than  3,000 mg per day    ° ° ° ° °For additional information about how and where to safely dispose of unused opioid °medications - https://www.morepowerfulnc.org ° °Disclaimer: This document  contains information and/or instructional materials adapted from [facility] for the typical patient with your condition. It does not replace medical advice from your health care provider because your experience may differ from that of the °typical patient. Talk to your health care provider if you have any questions about this °document, your condition or your treatment plan. °Adapted from [facility] ° °

## 2021-09-26 ENCOUNTER — Encounter: Payer: Self-pay | Admitting: Family Medicine

## 2021-09-26 ENCOUNTER — Encounter (HOSPITAL_COMMUNITY): Payer: Self-pay | Admitting: Surgery

## 2021-09-26 LAB — CBC WITH DIFFERENTIAL/PLATELET
Abs Immature Granulocytes: 0.04 10*3/uL (ref 0.00–0.07)
Basophils Absolute: 0 10*3/uL (ref 0.0–0.1)
Basophils Relative: 0 %
Eosinophils Absolute: 0 10*3/uL (ref 0.0–0.5)
Eosinophils Relative: 0 %
HCT: 39.9 % (ref 36.0–46.0)
Hemoglobin: 13.1 g/dL (ref 12.0–15.0)
Immature Granulocytes: 0 %
Lymphocytes Relative: 13 %
Lymphs Abs: 1.3 10*3/uL (ref 0.7–4.0)
MCH: 32.3 pg (ref 26.0–34.0)
MCHC: 32.8 g/dL (ref 30.0–36.0)
MCV: 98.5 fL (ref 80.0–100.0)
Monocytes Absolute: 0.6 10*3/uL (ref 0.1–1.0)
Monocytes Relative: 6 %
Neutro Abs: 8.3 10*3/uL — ABNORMAL HIGH (ref 1.7–7.7)
Neutrophils Relative %: 81 %
Platelets: 227 10*3/uL (ref 150–400)
RBC: 4.05 MIL/uL (ref 3.87–5.11)
RDW: 14.8 % (ref 11.5–15.5)
WBC: 10.3 10*3/uL (ref 4.0–10.5)
nRBC: 0 % (ref 0.0–0.2)

## 2021-09-26 LAB — COMPREHENSIVE METABOLIC PANEL
ALT: 105 U/L — ABNORMAL HIGH (ref 0–44)
AST: 45 U/L — ABNORMAL HIGH (ref 15–41)
Albumin: 3.5 g/dL (ref 3.5–5.0)
Alkaline Phosphatase: 106 U/L (ref 38–126)
Anion gap: 8 (ref 5–15)
BUN: 11 mg/dL (ref 6–20)
CO2: 23 mmol/L (ref 22–32)
Calcium: 9.1 mg/dL (ref 8.9–10.3)
Chloride: 104 mmol/L (ref 98–111)
Creatinine, Ser: 0.66 mg/dL (ref 0.44–1.00)
GFR, Estimated: >60 mL/min (ref >60)
Glucose, Bld: 103 mg/dL — ABNORMAL HIGH (ref 70–99)
Potassium: 4.4 mmol/L (ref 3.5–5.1)
Sodium: 135 mmol/L (ref 135–145)
Total Bilirubin: 0.8 mg/dL (ref 0.3–1.2)
Total Protein: 7.2 g/dL (ref 6.5–8.1)

## 2021-09-26 MED ORDER — ACETAMINOPHEN 500 MG PO TABS: 1000.0000 mg | ORAL_TABLET | Freq: Four times a day (QID) | ORAL | 0 refills | Status: DC

## 2021-09-26 MED ORDER — HYDRALAZINE HCL 20 MG/ML IJ SOLN
10.0000 mg | Freq: Once | INTRAMUSCULAR | Status: AC
  Administered 2021-09-26: 10 mg via INTRAVENOUS
  Filled 2021-09-26: qty 1

## 2021-09-26 MED ORDER — HYDROCHLOROTHIAZIDE 12.5 MG PO TABS
12.5000 mg | ORAL_TABLET | Freq: Every day | ORAL | Status: DC
  Administered 2021-09-26: 12.5 mg via ORAL
  Filled 2021-09-26: qty 1

## 2021-09-26 MED ORDER — LISINOPRIL-HYDROCHLOROTHIAZIDE 20-12.5 MG PO TABS: 1.0000 | ORAL_TABLET | Freq: Every day | ORAL | Status: DC

## 2021-09-26 MED ORDER — LISINOPRIL 20 MG PO TABS
20.0000 mg | ORAL_TABLET | Freq: Every day | ORAL | Status: DC
  Administered 2021-09-26: 20 mg via ORAL
  Filled 2021-09-26: qty 1

## 2021-09-26 MED ORDER — AMLODIPINE BESYLATE 10 MG PO TABS
10.0000 mg | ORAL_TABLET | Freq: Every morning | ORAL | Status: DC
  Administered 2021-09-26: 10 mg via ORAL
  Filled 2021-09-26: qty 1

## 2021-09-26 MED ORDER — LISINOPRIL 20 MG PO TABS: 20.0000 mg | ORAL_TABLET | Freq: Every day | ORAL | Status: DC

## 2021-09-26 MED ORDER — HYDROCHLOROTHIAZIDE 12.5 MG PO TABS: 12.5000 mg | ORAL_TABLET | Freq: Every day | ORAL | Status: DC

## 2021-09-26 NOTE — Progress Notes (Signed)
Patient ID: Cassandra Wilkinson, female   DOB: July 22, 1961, 61 y.o.   MRN: [id number] ?[facility] ?Progress Note ? ?1 Day Post-Op  ?Subjective: ?CC-  ?Up in chair. States that incisions are sore otherwise denies abdominal pain. Tolerating diet. BM this morning. Denies n/v. Ambulating without issues. Asking if she can go home.  ?LFTs continue to trend down. ? ?Objective: ?Vital signs in last 24 hours: ?Temp:  [97.6 ?F (36.4 ?C)-98.1 ?F (36.7 ?C)] 97.9 ?F (36.6 ?C) (03/07 0549) ?Pulse Rate:  [56-74] 68 (03/07 0833) ?Resp:  [14-20] 18 (03/07 0549) ?BP: (131-210)/(69-99) 162/79 (03/07 8299) ?SpO2:  [97 %-100 %] 100 % (03/07 0833) ?Last BM Date : 09/25/21 ? ?Intake/Output from previous day: ?03/06 0701 - 03/07 0700 ?In: 4328.4 [P.O.:120; I.V.:4008.4; IV Piggyback:200] ?Out: 5 [Blood:5] ?Intake/Output this shift: ?No intake/output data recorded. ? ?PE: ?Gen:  Alert, NAD, pleasant ?Abd: soft, nontender, lap incisions cdi except periumbilical incision which has opened up superficially >> dermabond and steri strip applied  ? ?Lab Results:  ?Recent Labs  ?  09/26/21 ?0505  ?WBC 10.3  ?HGB 13.1  ?HCT 39.9  ?PLT 227  ? ?BMET ?Recent Labs  ?  09/24/21 ?0450 09/26/21 ?0505  ?NA 136 135  ?K 3.7 4.4  ?CL 105 104  ?CO2 25 23  ?GLUCOSE 102* 103*  ?BUN 9 11  ?CREATININE 0.59 0.66  ?CALCIUM 8.5* 9.1  ? ?PT/INR ?No results for input(s): LABPROT, INR in the last 72 hours. ?CMP  ?   ?Component Value Date/Time  ? NA 135 09/26/2021 0505  ? K 4.4 09/26/2021 0505  ? CL 104 09/26/2021 0505  ? CO2 23 09/26/2021 0505  ? GLUCOSE 103 (H) 09/26/2021 0505  ? BUN 11 09/26/2021 0505  ? CREATININE 0.66 09/26/2021 0505  ? CALCIUM 9.1 09/26/2021 0505  ? PROT 7.2 09/26/2021 0505  ? ALBUMIN 3.5 09/26/2021 0505  ? AST 45 (H) 09/26/2021 0505  ? ALT 105 (H) 09/26/2021 0505  ? ALKPHOS 106 09/26/2021 0505  ? BILITOT 0.8 09/26/2021 0505  ? GFRNONAA >60 09/26/2021 0505  ? GFRAA >90 01/09/2014 2010  ? ?Lipase  ?   ?Component Value Date/Time  ? LIPASE 67  (H) 09/24/2021 0450  ? ? ? ? ? ?Studies/Results: ?No results found. ? ?Anti-infectives: ?Anti-infectives (From admission, onward)  ? ? Start     Dose/Rate Route Frequency Ordered Stop  ? 09/25/21 0730  cefTRIAXone (ROCEPHIN) 2 g in sodium chloride 0.9 % 100 mL IVPB       ? 2 g ?200 mL/hr over 30 Minutes Intravenous On call to O.R. 09/25/21 3716 09/25/21 1007  ? 09/23/21 1000  cefTRIAXone (ROCEPHIN) 2 g in sodium chloride 0.9 % 100 mL IVPB  Status:  Discontinued       ? 2 g ?200 mL/hr over 30 Minutes Intravenous Every 24 hours 09/22/21 1821 09/25/21 1139  ? 09/23/21 0730  ampicillin-sulbactam (UNASYN) 1.5 g in sodium chloride 0.9 % 100 mL IVPB  Status:  Discontinued       ? 1.5 g ?200 mL/hr over 30 Minutes Intravenous  Once 09/23/21 0723 09/23/21 0928  ? 09/22/21 1030  cefTRIAXone (ROCEPHIN) 2 g in sodium chloride 0.9 % 100 mL IVPB       ? 2 g ?200 mL/hr over 30 Minutes Intravenous  Once 09/22/21 1026 09/22/21 1119  ? ?  ? ? ? ?Assessment/Plan ?Biliary pancreatitis  ?Choledocholithiasis - s/p ERCP 3/4, no choledocholithiasis found ?POD#1 s/p laparoscopic cholecystectomy 3/6 Dr. Harlow Asa ?- Patient is ok for  discharge from surgical standpoint. Discharge instructions and follow up info on AVS. She does not want any narcotic pain medication, '1000mg'$  tylenol works well. ?  ?ID - rocephin 3/3>>3/6 ?VTE - ok for chemical dvt ppx from surgical standpoint ?FEN - regular diet ?Foley - none ?  ?HTN - home meds reordered  ?Hypokalemia ? ? ? LOS: 4 days  ? ? ?Wellington Hampshire, PA-C ?[facility] ?09/26/2021, 9:23 AM ?Please see Amion for pager number during day hours 7:00am-4:30pm ? ?

## 2021-09-26 NOTE — Progress Notes (Unsigned)
{  Select_TRH_Note:[id number]} 

## 2021-09-26 NOTE — Progress Notes (Signed)
pt BP 187/76 MAP 106. pt denies pain at this time. no PRN cardiac meds listed to address BP. ? ?On call APP Olena Heckle notified. ?Verbal order to recheck BP due to fluctuating BP trends. ? ?rechecked BP 210/99 MAP 127 ? ?On call APP Olena Heckle notifed,. PRN dose hydralazine '10mg'$  IV ordered and given to pt ? ?Will recheck BP momentarily ?

## 2021-09-26 NOTE — Discharge Summary (Signed)
. Physician Discharge Summary   Patient: Cassandra Wilkinson MRN: [id number] DOB: [birth year]  Admit date:     09/22/2021  Discharge date: 09/26/21  Discharge Physician: Nita Sells   PCP: [place].   Recommendations at discharge:   Needs Chem-12, CBC 1 week Recommend outpatient dental referral   Discharge Diagnoses: Principal Problem:   Acute gallstone pancreatitis Active Problems:   HTN (hypertension)   Hypokalemia   Acute cholecystitis  Resolved Problems:   * No resolved hospital problems. 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 Benson [place] 3/4  Patient underwent lap chole 09/25/2021    Assessment and Plan:  Resolved choledocholithiasis in the setting of ?  Cholecystitis ERCP as below Empiric ceftriaxone every 24 was discontinued prior to discharge Patient progressed well not requiring Dilaudid oxycodone and Tylenol 3 times daily seem to work well Diet was graduated as per general surgery-she understands weight lifting precautions in addition to diet limitations Gb related pancreatitis Patient underwent lap chole 3/6 successfully Severe hypokalemia Resolved on discharge HTN As needed hydralazine 10 every 8-resume lisinopril HCTZ amlodipine 10,      Consultants: General surgery Procedures performed: Lap chole, ERCP Disposition: Home Diet recommendation:  Discharge Diet Orders (From admission, onward)     Start     Ordered   09/26/21 0000  Diet - low sodium heart healthy        09/26/21 0948   09/26/21 0000  Diet - low sodium heart healthy        09/26/21 0948           Cardiac and Carb modified  diet DISCHARGE MEDICATION: Allergies as of 09/26/2021       Reactions   Codeine Swelling, Other (See Comments)   Throat does not swell (body, hands, and mouth do, however)   Kiwi Extract Swelling, Rash, Other (See Comments)   Throat does not swell (body, hands, and mouth do, however)        Medication List     STOP taking these medications    cetirizine 10 MG tablet Commonly known as: ZYRTEC       TAKE these medications    acetaminophen 500 MG tablet Commonly known as: TYLENOL Take 2 tablets (1,000 mg total) by mouth every 6 (six) hours.   amLODipine 10 MG tablet Commonly known as: NORVASC Take 10 mg by mouth in the morning.   BC HEADACHE POWDER PO Take 1 packet by mouth See admin instructions. Dissolve the contents of 1 packet in the mouth four to six times a day as needed for pain or headaches   erythromycin ophthalmic ointment Place a 1/2 inch ribbon of ointment into the lower eyelid 4 times daily for 5 days.   fluticasone 50 MCG/ACT nasal spray Commonly known as: FLONASE Place 2 sprays into both nostrils daily.   lisinopril-hydrochlorothiazide 20-12.5 MG tablet Commonly known as: Zestoretic Take 1 tablet by mouth daily. What changed:  when to take this additional instructions        Follow-up Information     [facility] [place] Follow up on 10/17/2021.   Specialty: General Surgery Why: 10 am, arrive by 9:30am for paperwork and check in process.  please bring  insurance card and photo ID Contact information: [place] [address] [phone]                Discharge Exam: Danley Danker Weights   09/23/21 0022  Weight: 100.5 kg   Awake pleasant no distress thick neck Mallampati 4 poor dentition front of mouth Neck soft supple no LAM Chest clear no rales no rhonchi Abdomen soft postoperative changes ROM intact Power 5/5  Condition at discharge: good  The results of significant diagnostics from this  hospitalization (including imaging, microbiology, ancillary and laboratory) are listed below for reference.   Imaging Studies: MR 3D Recon At Scanner  Result Date: 09/22/2021 EXAM: MRI ABDOMEN WITHOUT AND WITH CONTRAST (INCLUDING MRCP) TECHNIQUE: Multiplanar multisequence MR imaging of the abdomen was performed both before and after the administration of intravenous contrast. Heavily T2-weighted images of the biliary and pancreatic ducts were obtained, and three-dimensional MRCP images were rendered by post processing. CONTRAST:  24m GADAVIST GADOBUTROL 1 MMOL/ML IV SOLN COMPARISON:  CT chest angiogram, 03/22/2009 FINDINGS: Lower chest: No acute findings.  Small hiatal hernia. Hepatobiliary: Benign simple cyst of the left lobe of the liver (series 17, image 21). No mass or other parenchymal abnormality identified. Numerous gallstones in the gallbladder, and several small calculi in the gallbladder neck as well as within the proximal cystic duct measuring no greater than 0.3 cm (series 8, image 47). Low insertion of the cystic duct on the common bile duct. The common bile duct measures up to 0.8 cm in caliber. No intrahepatic biliary ductal dilatation. Pancreas: No mass, inflammatory changes, or other parenchymal abnormality identified.No pancreatic ductal dilatation. Spleen:  Within normal limits in size and appearance. Adrenals/Urinary Tract: Stable, definitively benign fat containing left adrenal adenoma (series 14, image 44). No renal masses or suspicious contrast enhancement identified. No evidence of hydronephrosis. Stomach/Bowel: Visualized portions within the abdomen are unremarkable. Vascular/Lymphatic: No pathologically enlarged lymph nodes identified. No abdominal aortic aneurysm demonstrated. Other:  None. Musculoskeletal: No suspicious osseous lesions identified. IMPRESSION: 1. Numerous gallstones in the gallbladder, and several small calculi in the gallbladder neck as well as within the proximal  cystic duct measuring no greater than 0.3 cm. No gallbladder wall thickening or inflammatory findings. 2. The common bile duct measures up to 0.8 cm in caliber, near the upper limit of normal in caliber, of uncertain significance, however without common bile duct calculus or other obstructing lesion identified to the ampulla. 3. Small hiatal hernia. Electronically Signed   By: ADelanna AhmadiM.D.   On: 09/22/2021 15:24   DG ERCP  Result Date: 09/23/2021 CLINICAL DATA:  Cholelithiasis. EXAM: ERCP TECHNIQUE: Multiple spot images obtained with the fluoroscopic device and submitted for interpretation post-procedure. FLUOROSCOPY TIME: FLUOROSCOPY TIME 2 minutes, 28 seconds (56 mGy) COMPARISON:  MRCP-09/22/2021; right upper quadrant abdominal ultrasound-09/22/2021 FINDINGS: Five spot intraoperative fluoroscopic images the right upper abdominal quadrant during ERCP are provided for review Initial image demonstrates an ERCP probe overlying the right upper abdominal quadrant. There is selective cannulation and opacification of the CBD which appears mildly dilated. There are 2 nonocclusive filling defects within the central aspect of the CBD, potentially representative of air bubbles versus nonocclusive choledocholithiasis. Subsequent images demonstrate insufflation of a balloon within distal aspect of the CBD with presumed biliary sweeping and sphincterotomy. There is minimal opacification of the central aspect of the intrahepatic biliary tree as well as the cystic duct. There is no definitive opacification of the pancreatic duct Completion image does not definitively  identify any persistent filling defects within the opacified portion of the biliary tree. IMPRESSION: ERCP with biliary sweeping and presumed sphincterotomy as above. These images were submitted for radiologic interpretation only. Please see the procedural report for the amount of contrast and the fluoroscopy time utilized. Electronically Signed   By: Sandi Mariscal M.D.   On: 09/23/2021 09:38   MR ABDOMEN MRCP W WO CONTAST  Result Date: 09/22/2021 EXAM: MRI ABDOMEN WITHOUT AND WITH CONTRAST (INCLUDING MRCP) TECHNIQUE: Multiplanar multisequence MR imaging of the abdomen was performed both before and after the administration of intravenous contrast. Heavily T2-weighted images of the biliary and pancreatic ducts were obtained, and three-dimensional MRCP images were rendered by post processing. CONTRAST:  68m GADAVIST GADOBUTROL 1 MMOL/ML IV SOLN COMPARISON:  CT chest angiogram, 03/22/2009 FINDINGS: Lower chest: No acute findings.  Small hiatal hernia. Hepatobiliary: Benign simple cyst of the left lobe of the liver (series 17, image 21). No mass or other parenchymal abnormality identified. Numerous gallstones in the gallbladder, and several small calculi in the gallbladder neck as well as within the proximal cystic duct measuring no greater than 0.3 cm (series 8, image 47). Low insertion of the cystic duct on the common bile duct. The common bile duct measures up to 0.8 cm in caliber. No intrahepatic biliary ductal dilatation. Pancreas: No mass, inflammatory changes, or other parenchymal abnormality identified.No pancreatic ductal dilatation. Spleen:  Within normal limits in size and appearance. Adrenals/Urinary Tract: Stable, definitively benign fat containing left adrenal adenoma (series 14, image 44). No renal masses or suspicious contrast enhancement identified. No evidence of hydronephrosis. Stomach/Bowel: Visualized portions within the abdomen are unremarkable. Vascular/Lymphatic: No pathologically enlarged lymph nodes identified. No abdominal aortic aneurysm demonstrated. Other:  None. Musculoskeletal: No suspicious osseous lesions identified. IMPRESSION: 1. Numerous gallstones in the gallbladder, and several small calculi in the gallbladder neck as well as within the proximal cystic duct measuring no greater than 0.3 cm. No gallbladder wall thickening or  inflammatory findings. 2. The common bile duct measures up to 0.8 cm in caliber, near the upper limit of normal in caliber, of uncertain significance, however without common bile duct calculus or other obstructing lesion identified to the ampulla. 3. Small hiatal hernia. Electronically Signed   By: ADelanna AhmadiM.D.   On: 09/22/2021 15:24   [place]Abdomen Limited RUQ (LIVER/GB)  Result Date: 09/22/2021 CLINICAL DATA:  61year old female with right upper quadrant abdominal pain. EXAM: ULTRASOUND ABDOMEN LIMITED RIGHT UPPER QUADRANT COMPARISON:  CTA chest 03/22/2009. FINDINGS: Gallbladder: Wall-echo-shadow sign (WES sign) indicating a gallbladder contracted around numerous stones (image 4). Positive sonographic Murphy sign elicited. No pericholecystic fluid is evident. Estimated individual gallstones size up to 19 mm. Common bile duct: Diameter: 10 mm, dilated. Liver: Questionable central, but no peripheral intrahepatic biliary ductal dilatation identified. No discrete liver lesion. Background liver echogenicity within normal limits. Portal vein is patent on color Doppler imaging with normal direction of blood flow towards the liver. Other: Negative visible right kidney. IMPRESSION: Gallbladder contracted around numerous stones with dilated CBD and positive sonographic Murphy sign suspicious for Choledocholithiasis +/- Acute Cholecystitis. Electronically Signed   By: HGenevie AnnM.D.   On: 09/22/2021 09:39    Microbiology: Results for orders placed or performed during the hospital encounter of 09/22/21  Resp Panel by RT-PCR (Flu A&B, Covid) Nasopharyngeal Swab     Status: None   Collection Time: 09/22/21  9:29 AM   Specimen: Nasopharyngeal Swab; Nasopharyngeal(NP) swabs in vial transport medium  Result Value Ref Range Status   SARS Coronavirus 2 by RT PCR NEGATIVE NEGATIVE Final    Comment: (NOTE) SARS-CoV-2 target nucleic acids are NOT DETECTED.  The SARS-CoV-2 RNA is generally detectable in upper  respiratory specimens during the acute phase of infection. The lowest concentration of SARS-CoV-2 viral copies this assay can detect is 138 copies/mL. A negative result does not preclude SARS-Cov-2 infection and should not be used as the sole basis for treatment or other patient management decisions. A negative result may occur with  improper specimen collection/handling, submission of specimen other than nasopharyngeal swab, presence of viral mutation(s) within the areas targeted by this assay, and inadequate number of viral copies(<138 copies/mL). A negative result must be combined with clinical observations, patient history, and epidemiological information. The expected result is Negative.  Fact Sheet for Patients:  EntrepreneurPulse.com.au  Fact Sheet for Healthcare Providers:  IncredibleEmployment.be  This test is no t yet approved or cleared by the [place] FDA and  has been authorized for detection and/or diagnosis of SARS-CoV-2 by FDA under an Emergency Use Authorization (EUA). This EUA will remain  in effect (meaning this test can be used) for the duration of the COVID-19 declaration under Section 564(b)(1) of the Act, 21 U.S.C.section 360bbb-3(b)(1), unless the authorization is terminated  or revoked sooner.       Influenza A by PCR NEGATIVE NEGATIVE Final   Influenza B by PCR NEGATIVE NEGATIVE Final    Comment: (NOTE) The Xpert Xpress SARS-CoV-2/FLU/RSV plus assay is intended as an aid in the diagnosis of influenza from Nasopharyngeal swab specimens and should not be used as a sole basis for treatment. Nasal washings and aspirates are unacceptable for Xpert Xpress SARS-CoV-2/FLU/RSV testing.  Fact Sheet for Patients: EntrepreneurPulse.com.au  Fact Sheet for Healthcare Providers: IncredibleEmployment.be  This test is not yet approved or cleared by the [place] FDA and has been  authorized for detection and/or diagnosis of SARS-CoV-2 by FDA under an Emergency Use Authorization (EUA). This EUA will remain in effect (meaning this test can be used) for the duration of the COVID-19 declaration under Section 564(b)(1) of the Act, 21 U.S.C. section 360bbb-3(b)(1), unless the authorization is terminated or revoked.  Performed at [facility], [place] [address] [place], [place] [address]   Surgical PCR screen     Status: None   Collection Time: 09/25/21  6:39 AM   Specimen: Nasal Mucosa; Nasal Swab  Result Value Ref Range Status   MRSA, PCR NEGATIVE NEGATIVE Final   Staphylococcus aureus NEGATIVE NEGATIVE Final    Comment: (NOTE) The Xpert SA Assay (FDA approved for NASAL specimens in patients 23 years of age and older), is one component of a comprehensive surveillance program. It is not intended to diagnose infection nor to guide or monitor treatment. Performed at [facility], [place] [address], [place], [place] [address]     Labs: CBC: Recent Labs  Lab 09/22/21 [phone] 09/23/21 0438 09/26/21 0505  WBC 5.0 5.1 10.3  NEUTROABS 3.8  --  8.3*  HGB 14.7 11.7* 13.1  HCT 44.4 36.2 39.9  MCV 96.7 97.6 98.5  PLT 273 234 710   Basic Metabolic Panel: Recent Labs  Lab 09/22/21 0718 09/22/21 1821 09/23/21 0438 09/24/21 0450 09/26/21 0505  NA 137  --  137 136 135  K 3.2*  --  2.8* 3.7 4.4  CL 102  --  107 105 104  CO2 26  --  '23 25 23  '$ GLUCOSE 112*  --  99 102* 103*  BUN 11  --  '9 9 11  '$ CREATININE 0.86  --  0.63 0.59 0.66  CALCIUM 9.0  --  8.3* 8.5* 9.1  MG  --  1.9 2.2  --   --    Liver Function Tests: Recent Labs  Lab 09/22/21 0718 09/23/21 0438 09/24/21 0450 09/26/21 0505  AST 332* 120* 59* 45*  ALT 353* 212* 150* 105*  ALKPHOS 170* 127* 112 106  BILITOT 4.4* 0.8 0.7 0.8  PROT 7.8 6.5 6.5 7.2  ALBUMIN 4.0 3.3* 3.3* 3.5   CBG: No results for input(s): GLUCAP in the last 168 hours.  Discharge time spent:  less than 30 minutes.  Signed: Nita Sells, MD [facility] 09/26/2021

## 2021-09-27 LAB — SURGICAL PATHOLOGY

## 2022-01-02 ENCOUNTER — Ambulatory Visit: Payer: 59 | Admitting: Podiatry

## 2022-01-02 DIAGNOSIS — B070 Plantar wart: Secondary | ICD-10-CM | POA: Diagnosis not present

## 2022-01-02 DIAGNOSIS — L84 Corns and callosities: Secondary | ICD-10-CM

## 2022-01-02 NOTE — Patient Instructions (Signed)
You can use urea cream on the calluses.  Try to find 30% or 40%.

## 2022-01-02 NOTE — Progress Notes (Signed)
Subjective:   Patient ID: Cassandra Wilkinson, female   DOB: 61 y.o.   MRN: [id number]   HPI 61 year old female presents the office with concerns of bilateral calluses as well as concern for possible wart on the bottom of her left foot.  She states it started a month ago.  She had a wart long time ago when she was 62 years old.  She tried over-the-counter wart acid drainage today but down.  Denies any recent injury or trauma recently any foreign objects. No redness or drainage.  No other concerns   Past Medical History:  Diagnosis Date   Hypertension     Past Surgical History:  Procedure Laterality Date   APPENDECTOMY     CESAREAN SECTION     CHOLECYSTECTOMY N/A 09/25/2021   Procedure: LAPAROSCOPIC CHOLECYSTECTOMY;  Surgeon: Armandina Gemma, MD;  Location: [facility];  Service: General;  Laterality: N/A;   ERCP N/A 09/23/2021   Procedure: ENDOSCOPIC RETROGRADE CHOLANGIOPANCREATOGRAPHY (ERCP);  Surgeon: Carol Ada, MD;  Location: [facility];  Service: Gastroenterology;  Laterality: N/A;   SPHINCTEROTOMY  09/23/2021   Procedure: SPHINCTEROTOMY;  Surgeon: Carol Ada, MD;  Location: [facility];  Service: Gastroenterology;;   TUBAL LIGATION       Current Outpatient Medications:    acetaminophen (TYLENOL) 500 MG tablet, Take 2 tablets (1,000 mg total) by mouth every 6 (six) hours., Disp: 30 tablet, Rfl: 0   amLODipine (NORVASC) 10 MG tablet, Take 10 mg by mouth in the morning., Disp: , Rfl:    Aspirin-Salicylamide-Caffeine (BC HEADACHE POWDER PO), Take 1 packet by mouth See admin instructions. Dissolve the contents of 1 packet in the mouth four to six times a day as needed for pain or headaches, Disp: , Rfl:    fluticasone (FLONASE) 50 MCG/ACT nasal spray, Place 2 sprays into both nostrils daily. (Patient not taking: Reported on 09/22/2021), Disp: 9.9 mL, Rfl: 0   lisinopril-hydrochlorothiazide (ZESTORETIC) 20-12.5 MG tablet, Take 1 tablet by mouth daily. (Patient taking differently: Take 1 tablet  by mouth See admin instructions. Take 1 tablet by mouth in the morning and at lunchtime), Disp: 30 tablet, Rfl: 0  Allergies  Allergen Reactions   Codeine Swelling and Other (See Comments)    Throat does not swell (body, hands, and mouth do, however)   Kiwi Extract Swelling, Rash and Other (See Comments)    Throat does not swell (body, hands, and mouth do, however)          Objective:  Physical Exam  General: AAO x3, NAD  Dermatological: Hyperkeratotic lesion of the bilateral hallux without any underlying ulceration drainage or any signs of infection.  Hyperkeratotic lesion the fifth metatarsal base on the left foot there is a punctate annular lesion.  No underlying ulceration noted either.  There is no puncture wound or foreign body.  Vascular: Dorsalis Pedis artery and Posterior Tibial artery pedal pulses are 2/4 bilateral with immedate capillary fill time. There is no pain with calf compression, swelling, warmth, erythema.   Neruologic: Grossly intact via light touch bilateral.  Musculoskeletal: Tenderness the skin lesions.  No other areas of discomfort.  Muscular strength 5/5 in all groups tested bilateral.  Gait: Unassisted, Nonantalgic.       Assessment:   Hyperkeratotic lesions bilateral hallux, likely wart left foot     Plan:  Sharply debrided the hyperkeratotic lesions without any complications or bleeding.  For the area in the left fifth metatarsal base after debridement I cleaned the area with alcohol and a pad was  placed followed by salicylic acid and a bandage.  Postprocedure instructions discussed.  Monitor for any signs or symptoms of infection.  Discussed shoe modifications help with offloading.  Discussed urea cream.  Trula Slade DPM

## 2022-03-16 ENCOUNTER — Ambulatory Visit: Payer: 59 | Admitting: Podiatry

## 2022-03-29 ENCOUNTER — Ambulatory Visit: Payer: 59 | Admitting: Podiatry

## 2022-05-21 ENCOUNTER — Encounter: Payer: Self-pay | Admitting: Podiatry

## 2022-05-21 ENCOUNTER — Ambulatory Visit (INDEPENDENT_AMBULATORY_CARE_PROVIDER_SITE_OTHER): Payer: Medicaid Other | Admitting: Podiatry

## 2022-05-21 DIAGNOSIS — D492 Neoplasm of unspecified behavior of bone, soft tissue, and skin: Secondary | ICD-10-CM

## 2022-05-21 DIAGNOSIS — B070 Plantar wart: Secondary | ICD-10-CM

## 2022-05-21 NOTE — Progress Notes (Signed)
   Chief Complaint  Patient presents with   Callouses    Subjective: 61 y.o. female presenting today for evaluation of a symptomatic skin lesion to the plantar aspect of the left foot.  Patient states it is very tender and painful.  She came into the office earlier this year seen by another physician who recommended salicylic acid.  She did not find or get any salicylic acid so she has not done anything recently for treatment.  Is been present for about a year now.   Past Medical History:  Diagnosis Date   Hypertension    Past Surgical History:  Procedure Laterality Date   APPENDECTOMY     CESAREAN SECTION     CHOLECYSTECTOMY N/A 09/25/2021   Procedure: LAPAROSCOPIC CHOLECYSTECTOMY;  Surgeon: Armandina Gemma, MD;  Location: [facility];  Service: General;  Laterality: N/A;   ERCP N/A 09/23/2021   Procedure: ENDOSCOPIC RETROGRADE CHOLANGIOPANCREATOGRAPHY (ERCP);  Surgeon: Carol Ada, MD;  Location: [facility];  Service: Gastroenterology;  Laterality: N/A;   SPHINCTEROTOMY  09/23/2021   Procedure: SPHINCTEROTOMY;  Surgeon: Carol Ada, MD;  Location: [facility];  Service: Gastroenterology;;   TUBAL LIGATION     Allergies  Allergen Reactions   Codeine Swelling and Other (See Comments)    Throat does not swell (body, hands, and mouth do, however)   Kiwi Extract Swelling, Rash and Other (See Comments)    Throat does not swell (body, hands, and mouth do, however)    Objective: Physical Exam General: The patient is alert and oriented x3 in no acute distress.   Dermatology: Hyperkeratotic skin lesion(s) noted to the plantar aspect of the left foot approximately 1 cm in diameter. Pinpoint bleeding noted upon debridement. Skin is warm, dry and supple bilateral lower extremities. Negative for open lesions or macerations.   Vascular: Palpable pedal pulses bilaterally. No edema or erythema noted. Capillary refill within normal limits.   Neurological: Epicritic and protective threshold grossly  intact bilaterally.    Musculoskeletal Exam: Pain on palpation to the noted skin lesion(s).  Range of motion within normal limits to all pedal and ankle joints bilateral. Muscle strength 5/5 in all groups bilateral.    Assessment: #1 plantar wart left foot   Plan of Care:  #1 Patient was evaluated. #2 Excisional debridement of the plantar wart lesion(s) was performed using a chisel blade. Cantharone was applied and the lesion(s) was dressed with a dry sterile dressing. #3 Revitaderm lotion dispensed at checkout.  #4 return to clinic 3 weeks  *[place], [place].  Edrick Kins, DPM [facility]  Dr. Edrick Kins, DPM    [address], [place] [address]                Office [phone]  Fax [phone]

## 2022-06-18 ENCOUNTER — Ambulatory Visit: Payer: Medicaid Other | Admitting: Podiatry

## 2022-10-22 ENCOUNTER — Ambulatory Visit: Payer: Medicaid Other | Admitting: Podiatry

## 2022-10-24 ENCOUNTER — Ambulatory Visit (INDEPENDENT_AMBULATORY_CARE_PROVIDER_SITE_OTHER): Payer: Medicaid Other | Admitting: Podiatry

## 2022-10-24 DIAGNOSIS — D492 Neoplasm of unspecified behavior of bone, soft tissue, and skin: Secondary | ICD-10-CM

## 2022-10-24 DIAGNOSIS — B070 Plantar wart: Secondary | ICD-10-CM

## 2022-10-24 NOTE — Progress Notes (Signed)
   Chief Complaint  Patient presents with   Plantar Warts    Patient came in today for left foot plantar wart  follow-up     Subjective: 62 y.o. female presenting today for evaluation of a symptomatic skin lesion to the plantar aspect of the left foot that has returned.  Patient states the Cantharone fixed the plantar wart to the left foot but slowly over time she is afraid that it has returned.  It is now very symptomatic.  Presenting for further treatment and evaluation   Past Medical History:  Diagnosis Date   Hypertension    Past Surgical History:  Procedure Laterality Date   APPENDECTOMY     CESAREAN SECTION     CHOLECYSTECTOMY N/A 09/25/2021   Procedure: LAPAROSCOPIC CHOLECYSTECTOMY;  Surgeon: Armandina Gemma, MD;  Location: [facility];  Service: General;  Laterality: N/A;   ERCP N/A 09/23/2021   Procedure: ENDOSCOPIC RETROGRADE CHOLANGIOPANCREATOGRAPHY (ERCP);  Surgeon: Carol Ada, MD;  Location: [facility];  Service: Gastroenterology;  Laterality: N/A;   SPHINCTEROTOMY  09/23/2021   Procedure: SPHINCTEROTOMY;  Surgeon: Carol Ada, MD;  Location: [facility];  Service: Gastroenterology;;   TUBAL LIGATION     Allergies  Allergen Reactions   Codeine Swelling and Other (See Comments)    Throat does not swell (body, hands, and mouth do, however)   Kiwi Extract Swelling, Rash and Other (See Comments)    Throat does not swell (body, hands, and mouth do, however)    Objective: Physical Exam General: The patient is alert and oriented x3 in no acute distress.   Dermatology: Hyperkeratotic skin lesion(s) noted to the plantar aspect of the left foot approximately 1 cm in diameter. Pinpoint bleeding noted upon debridement. Skin is warm, dry and supple bilateral lower extremities. Negative for open lesions or macerations.   Vascular: Palpable pedal pulses bilaterally. No edema or erythema noted. Capillary refill within normal limits.   Neurological: Epicritic and protective threshold  grossly intact bilaterally.    Musculoskeletal Exam: Pain on palpation to the noted skin lesion(s).  Range of motion within normal limits to all pedal and ankle joints bilateral. Muscle strength 5/5 in all groups bilateral.    Assessment: #1 plantar wart left foot; recurrent   Plan of Care:  #1 Patient was evaluated. #2 Excisional debridement of the plantar wart lesion(s) was performed using a chisel blade. Cantharone was re-applied and the lesion(s) was dressed with a dry sterile dressing. #3 Revitaderm lotion dispensed at checkout.  #4 return to clinic 3 weeks  *[place], [place].  Edrick Kins, DPM [facility]  Dr. Edrick Kins, DPM    [address],  [address]                Office [phone]  Fax [phone]

## 2023-01-07 ENCOUNTER — Encounter: Payer: Self-pay | Admitting: Podiatry

## 2023-01-07 ENCOUNTER — Ambulatory Visit (INDEPENDENT_AMBULATORY_CARE_PROVIDER_SITE_OTHER): Payer: Medicaid Other | Admitting: Podiatry

## 2023-01-07 DIAGNOSIS — B070 Plantar wart: Secondary | ICD-10-CM | POA: Diagnosis not present

## 2023-01-07 NOTE — Progress Notes (Signed)
   Chief Complaint  Patient presents with   Callouses    "I need these shaved and I have that wart thing coming back up on my foot."    Subjective: 62 y.o. female presenting today for follow-up evaluation of a symptomatic skin lesion to the plantar aspect of the left foot that has returned.  Patient states the Cantharone fixed the plantar wart to the left foot but slowly over time she is afraid that it has returned.  It is now very symptomatic.  Presenting for further treatment and evaluation   Past Medical History:  Diagnosis Date   Hypertension    Past Surgical History:  Procedure Laterality Date   APPENDECTOMY     CESAREAN SECTION     CHOLECYSTECTOMY N/A 09/25/2021   Procedure: LAPAROSCOPIC CHOLECYSTECTOMY;  Surgeon: Darnell Level, MD;  [facility]: [facility];  Service: General;  Laterality: N/A;   ERCP N/A 09/23/2021   Procedure: ENDOSCOPIC RETROGRADE CHOLANGIOPANCREATOGRAPHY (ERCP);  Surgeon: Jeani Hawking, MD;  Location: [facility];  Service: Gastroenterology;  Laterality: N/A;   SPHINCTEROTOMY  09/23/2021   Procedure: SPHINCTEROTOMY;  Surgeon: Jeani Hawking, MD;  [facility];  Service: Gastroenterology;;   TUBAL LIGATION     Allergies  Allergen Reactions   Codeine Swelling and Other (See Comments)    Throat does not swell (body, hands, and mouth do, however)   Kiwi Extract Swelling, Rash and Other (See Comments)    Throat does not swell (body, hands, and mouth do, however)    Objective: Physical Exam General: The patient is alert and oriented x3 in no acute distress.   Dermatology: Hyperkeratotic skin lesion(s) noted to the plantar aspect of the left foot approximately 1 cm in diameter. Pinpoint bleeding noted upon debridement. Skin is warm, dry and supple bilateral lower extremities. Negative for open lesions or macerations.   Vascular: Palpable pedal pulses bilaterally. No edema or erythema noted. Capillary refill within normal limits.   Neurological: Epicritic and  protective threshold grossly intact bilaterally.    Musculoskeletal Exam: Pain on palpation to the noted skin lesion(s).  Range of motion within normal limits to all pedal and ankle joints bilateral. Muscle strength 5/5 in all groups bilateral.    Assessment: #1 plantar wart left foot; recurrent   Plan of Care:  #1 Patient was evaluated. #2 Excisional debridement of the plantar wart lesion(s) was performed using a chisel blade. Cantharone was re-applied and the lesion(s) was dressed with a dry sterile dressing. #3 Revitaderm lotion dispensed at checkout.  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  Felecia Shelling, DPM [facility]  Dr. Felecia Shelling, DPM    [address] [place], [place] [address]                Office [phone]  Fax [phone]

## 2023-02-04 ENCOUNTER — Encounter: Payer: Self-pay | Admitting: Podiatry

## 2023-02-04 ENCOUNTER — Ambulatory Visit: Payer: Medicaid Other | Admitting: Podiatry

## 2023-02-04 DIAGNOSIS — B070 Plantar wart: Secondary | ICD-10-CM

## 2023-02-04 DIAGNOSIS — D492 Neoplasm of unspecified behavior of bone, soft tissue, and skin: Secondary | ICD-10-CM

## 2023-02-04 NOTE — Progress Notes (Signed)
   Chief Complaint  Patient presents with   Callouses    Trim calluses medial hallux bilateral     Plantar Warts    Follow up plantar wart left   "Its better"    Subjective: 62 y.o. female presenting today for follow-up evaluation of a symptomatic skin lesion to the plantar aspect of the left foot that has returned.  Patient states that last visit the Cantharone helped significantly.  She no longer has any significant pain or tenderness associated to the verruca lesion   Past Medical History:  Diagnosis Date   Hypertension    Past Surgical History:  Procedure Laterality Date   APPENDECTOMY     CESAREAN SECTION     CHOLECYSTECTOMY N/A 09/25/2021   Procedure: LAPAROSCOPIC CHOLECYSTECTOMY;  Surgeon: Darnell Level, MD;  [facility];  Service: General;  Laterality: N/A;   ERCP N/A 09/23/2021   Procedure: ENDOSCOPIC RETROGRADE CHOLANGIOPANCREATOGRAPHY (ERCP);  Surgeon: Jeani Hawking, MD;  Location: [facility];  Service: Gastroenterology;  Laterality: N/A;   SPHINCTEROTOMY  09/23/2021   Procedure: SPHINCTEROTOMY;  Surgeon: Jeani Hawking, MD;  [facility];  Service: Gastroenterology;;   TUBAL LIGATION     Allergies  Allergen Reactions   Codeine Swelling and Other (See Comments)    Throat does not swell (body, hands, and mouth do, however)   Kiwi Extract Swelling, Rash and Other (See Comments)    Throat does not swell (body, hands, and mouth do, however)    Objective: Physical Exam General: The patient is alert and oriented x3 in no acute distress.   Dermatology: Hyperkeratotic skin lesion(s) noted to the plantar aspect of the left foot approximately 1 cm in diameter. Pinpoint bleeding noted upon debridement. Skin is warm, dry and supple bilateral lower extremities. Negative for open lesions or macerations.   Vascular: Palpable pedal pulses bilaterally. No edema or erythema noted. Capillary refill within normal limits.   Neurological: Grossly intact via light touch    Musculoskeletal Exam: Minimal pain on palpation to the noted skin lesion(s).  Range of motion within normal limits to all pedal and ankle joints bilateral. Muscle strength 5/5 in all groups bilateral.    Assessment: #1 plantar wart left foot; recurrent   Plan of Care:  #1 Patient was evaluated. #2 Excisional debridement of the plantar wart lesion(s) was performed using a chisel blade.  Salicylic acid was applied and the lesion(s) was dressed with a dry sterile dressing. 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  Felecia Shelling, DPM [facility]  Dr. Felecia Shelling, DPM    [address] [place], [place] [address]                Office [phone]  Fax [phone]

## 2023-06-05 ENCOUNTER — Ambulatory Visit: Payer: Medicaid Other | Admitting: Podiatry

## 2023-06-10 ENCOUNTER — Ambulatory Visit: Payer: Medicaid Other | Admitting: Podiatry

## 2023-08-14 ENCOUNTER — Ambulatory Visit: Payer: Medicaid Other | Admitting: Podiatry

## 2023-08-31 ENCOUNTER — Emergency Department (HOSPITAL_COMMUNITY)
Admission: EM | Admit: 2023-08-31 | Discharge: 2023-08-31 | Disposition: A | Discharge status: Home / Self Care | Payer: Medicaid Other | Attending: Emergency Medicine | Admitting: Emergency Medicine

## 2023-08-31 ENCOUNTER — Emergency Department (HOSPITAL_COMMUNITY): Payer: Medicaid Other

## 2023-08-31 ENCOUNTER — Encounter (HOSPITAL_COMMUNITY): Payer: Self-pay

## 2023-08-31 ENCOUNTER — Other Ambulatory Visit: Payer: Self-pay

## 2023-08-31 DIAGNOSIS — M25561 Pain in right knee: Secondary | ICD-10-CM | POA: Insufficient documentation

## 2023-08-31 MED ORDER — NAPROXEN 500 MG PO TABS
500.0000 mg | ORAL_TABLET | Freq: Once | ORAL | Status: AC
  Administered 2023-08-31: 500 mg via ORAL
  Filled 2023-08-31: qty 1

## 2023-08-31 MED ORDER — PREDNISONE 10 MG PO TABS: ORAL_TABLET | ORAL | 0 refills | Status: DC

## 2023-08-31 MED ORDER — PREDNISONE 10 MG PO TABS: ORAL_TABLET | ORAL | 0 refills | Status: AC

## 2023-08-31 MED ORDER — PREDNISONE 20 MG PO TABS
40.0000 mg | ORAL_TABLET | Freq: Once | ORAL | Status: AC
  Administered 2023-08-31: 40 mg via ORAL
  Filled 2023-08-31: qty 2

## 2023-08-31 NOTE — ED Provider Notes (Signed)
 [place] EMERGENCY DEPARTMENT AT [facility] Provider Note   CSN: [id number] Arrival date & time: 08/31/23  0845     History  Chief Complaint  Patient presents with   Knee Pain    Cassandra Wilkinson is a 63 y.o. female with no significant past medical history presents with concern for left knee pain that has been ongoing for the past 3 days.  States the pain came on suddenly.  She denies any injuries or falls.  Denies any cuts or abrasions, fevers or chills.  Reports difficulty putting weight on the knee.  Denies any pain or swelling in her calf.  Denies any numbness or tingling in the lower extremities.   Knee Pain Associated symptoms: no fever        Home Medications Prior to Admission medications   Medication Sig Start Date End Date Taking? Authorizing Provider  predniSONE  (DELTASONE ) 10 MG tablet Take 4 tablets (40 mg total) by mouth daily with breakfast for 1 day, THEN 3 tablets (30 mg total) daily with breakfast for 2 days, THEN 2 tablets (20 mg total) daily with breakfast for 2 days, THEN 1 tablet (10 mg total) daily with breakfast for 1 day. 08/31/23 09/06/23 Yes Veta Palma, PA-C  acetaminophen  (TYLENOL ) 500 MG tablet Take 2 tablets (1,000 mg total) by mouth every 6 (six) hours. 09/26/21   Samtani, Jai-Gurmukh, MD  amLODipine  (NORVASC ) 10 MG tablet Take 10 mg by mouth in the morning. 12/18/20   [provider]  amoxicillin -clavulanate (AUGMENTIN ) 875-125 MG tablet Take 1 tablet by mouth 2 (two) times daily. 12/09/21   [provider]  Aspirin-Salicylamide-Caffeine (BC HEADACHE POWDER PO) Take 1 packet by mouth See admin instructions. Dissolve the contents of 1 packet in the mouth four to six times a day as needed for pain or headaches    [provider]  fluconazole  (DIFLUCAN ) 150 MG tablet Take 150 mg by mouth every 3 (three) days. 05/10/22   [provider]  fluticasone  (FLONASE ) 50 MCG/ACT nasal spray Place 2 sprays into  both nostrils daily. 05/14/20   Donah Riis A, PA-C  lisinopril -hydrochlorothiazide  (ZESTORETIC ) 20-12.5 MG tablet Take 1 tablet by mouth daily. Patient taking differently: Take 1 tablet by mouth See admin instructions. Take 1 tablet by mouth in the morning and at lunchtime 06/29/17   Josem Bring, MD      Allergies    Codeine and Kiwi extract    Review of Systems   Review of Systems  Constitutional:  Negative for chills and fever.    Physical Exam Updated Vital Signs BP (!) 120/56 (BP Location: Left Arm)   Pulse 79   Temp 98.1 F (36.7 C) (Oral)   Resp 16   Ht 5' 7 (1.702 m)   Wt 100.5 kg   LMP 06/24/2012   SpO2 92%   BMI 34.70 kg/m  Physical Exam Vitals and nursing note reviewed.  Constitutional:      Appearance: Normal appearance.  HENT:     Head: Atraumatic.  Cardiovascular:     Rate and Rhythm: Normal rate and regular rhythm.     Comments: 2+ dorsalis pedis pulses bilaterally Pulmonary:     Effort: Pulmonary effort is normal.  Musculoskeletal:     Comments: Lower extremity:  General: Diffuse edema of the right knee without erythema.  No obvious deformity, contusions, open wounds bilaterally of the knees or calves bilaterally  Palpation Tender to palpation over the medial aspect of the left knee diffusely. Non tender  over the femur, tibia and fibula, popliteal fossa, and calves bilaterally  ROM Full knee flexion and extension bilaterally Able to ambulate, but with some pain in the left knee  Special tests No ligamentous laxity with anterior drawer testing or posterior drawer testing, valgus or varus stress of the knees bilaterally  Sensation: Sensation intact throughout the lower extremity  Strength: 5/5 strength with resisted knee flexion and extension  5/5 strength with resisted ankle plantarflexion and dorsiflexion   Neurological:     General: No focal deficit present.     Mental Status: She is alert.  Psychiatric:        Mood and  Affect: Mood normal.        Behavior: Behavior normal.     ED Results / Procedures / Treatments   Labs (all labs ordered are listed, but only abnormal results are displayed) Labs Reviewed - No data to display  EKG None  Radiology DG Knee Complete 4 Views Left Result Date: 08/31/2023 CLINICAL DATA:  Knee pain. EXAM: LEFT KNEE - COMPLETE 4+ VIEW COMPARISON:  No comparison studies available. FINDINGS: No acute fracture. No subluxation or dislocation. No joint effusion. Trace degenerative spurring noted patellofemoral compartment. No worrisome lytic or sclerotic osseous abnormality. IMPRESSION: Trace degenerative spurring in the patellofemoral compartment. No acute bony findings. Electronically Signed   By: Camellia Candle M.D.   On: 08/31/2023 09:46    Procedures Procedures    Medications Ordered in ED Medications  naproxen  (NAPROSYN ) tablet 500 mg (has no administration in time range)  predniSONE  (DELTASONE ) tablet 40 mg (has no administration in time range)    ED Course/ Medical Decision Making/ A&P                                 Medical Decision Making Amount and/or Complexity of Data Reviewed Radiology: ordered.  Risk Prescription drug management.     Differential diagnosis includes but is not limited to osteoarthritis, rheumatoid arthritis, septic arthritis, gout, ligamentous injury  ED Course:  Patient well appearing, no acute distress. Vital signs stable, no tachycardia, afebrile. Left knee with some edema but no erythema. Able to fully flex and extend left knee. No overlying abrasions, low concern for septic arthritis at this time. X ray of the left knee without acute abnormality; does show patellar spurring but this is not where patient's pain is today. No injuries to the knee and no ligamentous laxity to suggest ligament injury or other fracture at this time. Given sudden onset of pain and swelling, suspect gout. Patient states gout runs in the family. Patient  states she is not diabetic, no history of kidney problems. Last creatine reviewed which was within normal limits Given Prednisone  40mg  and 500mg  Naproxen  for gout   Impression: Left knee pain, likely gout  Disposition:  The patient was discharged home with instructions to take prednisone  taper as prescribed. Take naproxen  twice daily for up to 5 days as needed for pain. Follow up with PCP within the next 3 days for recheck of her knee.  Return precautions given.  Imaging Studies ordered: I ordered imaging studies including x-ray left knee  I independently visualized the imaging with scope of interpretation limited to determining acute life threatening conditions related to emergency care. Imaging showed patellofemoral compartment spurring, otherwise no acute abnormalities I agree with the radiologist interpretation              Final Clinical Impression(s) /  ED Diagnoses Final diagnoses:  Acute pain of right knee    Rx / DC Orders ED Discharge Orders          Ordered    predniSONE  (DELTASONE ) 10 MG tablet  Q breakfast        08/31/23 1222              Veta Palma, PA-C 08/31/23 1237    Dean Clarity, MD 08/31/23 [phone]

## 2023-08-31 NOTE — ED Triage Notes (Signed)
 Pt reports with left knee pain since Saturday. Pt is unable to put weight on it.

## 2023-08-31 NOTE — Discharge Instructions (Addendum)
 You likely have a gout flare. Your x-ray did not show any fracture or dislocation  You have been started on Naproxen  here in the ER for your gout.  You are given your first dose here in the ER, your next dose can be no sooner than midnight tonight.  Please continue to take 500mg  naproxen  (Aleve ) every 12 hours as needed for pain.  This is an over-the-counter medication you can get at any drugstore or grocery store.  Do not take continuously for longer than 5 days.   You have been prescribed prednisone . Please take this medication as prescribed for the next 7 days (40mg  on days 1 and 2, 30mg  on days 3 and 4, 20mg  on days 5 and 6, 10mg  on day 7).  You were given first dose today in the ER.  You may take the next dose tomorrow morning.  Take this medication in the morning, as taking it at night may make it hard to sleep. If you are a diabetic, please monitor your blood sugars closely on this medication, as it can cause your blood sugar to rise. If your blood sugars rise above 400 and you start not feeling well, stop taking the medication and go to the ER.   Please follow-up with your PCP within the next 3 days for recheck of your knee.  If flareups become common, there are some preventative medications your PCP can start you on if indicated.   Maintaining a healthy weight and healthy blood pressure can help reduce the risk of flares. Limit consumption of alcohol, sweetened beverages (like sodas and sweet tea), red meats, organ meats (liver), and shellfish. Consumption of these foods can trigger flares.   Return to the ER if your symptoms do not start to improve within the next 48 hours, you develop fever or chills, any other new or concerning symptoms

## 2023-09-16 ENCOUNTER — Other Ambulatory Visit (HOSPITAL_BASED_OUTPATIENT_CLINIC_OR_DEPARTMENT_OTHER): Payer: Self-pay | Admitting: Adult Health Nurse Practitioner

## 2023-09-16 DIAGNOSIS — M7989 Other specified soft tissue disorders: Secondary | ICD-10-CM

## 2023-09-16 DIAGNOSIS — M25562 Pain in left knee: Secondary | ICD-10-CM | POA: Diagnosis not present

## 2023-09-17 ENCOUNTER — Ambulatory Visit (HOSPITAL_BASED_OUTPATIENT_CLINIC_OR_DEPARTMENT_OTHER): Payer: Medicaid Other

## 2023-09-24 ENCOUNTER — Ambulatory Visit (HOSPITAL_BASED_OUTPATIENT_CLINIC_OR_DEPARTMENT_OTHER)
Admission: RE | Admit: 2023-09-24 | Discharge: 2023-09-24 | Disposition: A | Discharge status: Home / Self Care | Payer: Medicaid Other | Source: Ambulatory Visit | Attending: Adult Health Nurse Practitioner | Admitting: Adult Health Nurse Practitioner

## 2023-09-24 DIAGNOSIS — M7989 Other specified soft tissue disorders: Secondary | ICD-10-CM | POA: Diagnosis present

## 2023-12-09 ENCOUNTER — Ambulatory Visit (INDEPENDENT_AMBULATORY_CARE_PROVIDER_SITE_OTHER): Admitting: Podiatry

## 2023-12-09 DIAGNOSIS — D2372 Other benign neoplasm of skin of left lower limb, including hip: Secondary | ICD-10-CM

## 2023-12-09 NOTE — Progress Notes (Signed)
   Chief Complaint  Patient presents with   Plantar Warts    Here today for PW on the left foot. Last treated 02/04/23.  It does have a callous built up around it. She also has bilateral callous on each first toe. She would like these shaved down as well. Not diabetic and takes [facility] powder    Subjective: 63 y.o. female presenting to the office today for follow-up evaluation of a symptomatic skin lesion to the plantar aspect of the left foot.  Last seen in the office on 02/04/2023.  Pain and tenderness on a daily basis despite wearing good supportive tennis shoes.  Extremities treated feels good temporarily and slowly returned   Past Medical History:  Diagnosis Date   Hypertension     Past Surgical History:  Procedure Laterality Date   APPENDECTOMY     CESAREAN SECTION     CHOLECYSTECTOMY N/A 09/25/2021   Procedure: LAPAROSCOPIC CHOLECYSTECTOMY;  Surgeon: Oralee Billow, MD;  Location: [facility];  Service: General;  Laterality: N/A;   ERCP N/A 09/23/2021   Procedure: ENDOSCOPIC RETROGRADE CHOLANGIOPANCREATOGRAPHY (ERCP);  Surgeon: Alvis Jourdain, MD;  Location: [facility];  Service: Gastroenterology;  Laterality: N/A;   SPHINCTEROTOMY  09/23/2021   Procedure: SPHINCTEROTOMY;  Surgeon: Alvis Jourdain, MD;  [facility]: [facility];  Service: Gastroenterology;;   TUBAL LIGATION      Allergies  Allergen Reactions   Codeine Swelling and Other (See Comments)    Throat does not swell (body, hands, and mouth do, however)   Kiwi Extract Swelling, Rash and Other (See Comments)    Throat does not swell (body, hands, and mouth do, however)     Objective:  Physical Exam General: Alert and oriented x3 in no acute distress  Dermatology: Hyperkeratotic lesion(s) present on the plantar aspect of the left foot. Pain on palpation with a central nucleated core noted. Skin is warm, dry and supple bilateral lower extremities. Negative for open lesions or macerations.  Vascular: Palpable pedal pulses  bilaterally. No edema or erythema noted. Capillary refill within normal limits.  Neurological: Grossly intact via light touch  Musculoskeletal Exam: Pain on palpation at the keratotic lesion(s) noted. Range of motion within normal limits bilateral. Muscle strength 5/5 in all groups bilateral.  Assessment: 1.  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  Dot Gazella, DPM [facility]  Dr. Dot Gazella, DPM    [address] [place], [place] [address]                Office [phone]  Fax [phone]

## 2023-12-30 ENCOUNTER — Encounter: Payer: Self-pay | Admitting: Podiatry

## 2023-12-30 ENCOUNTER — Ambulatory Visit (INDEPENDENT_AMBULATORY_CARE_PROVIDER_SITE_OTHER): Admitting: Podiatry

## 2023-12-30 VITALS — Ht 67.0 in | Wt 221.6 lb

## 2023-12-30 DIAGNOSIS — D2372 Other benign neoplasm of skin of left lower limb, including hip: Secondary | ICD-10-CM | POA: Diagnosis not present

## 2023-12-30 NOTE — Progress Notes (Signed)
   Chief Complaint  Patient presents with   Plantar Warts    Pt is here to f/u on plantar warts.    Subjective: 63 y.o. 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  Overall improvement.  She continues have some slight tenderness but much better than last visit   Past Medical History:  Diagnosis Date   Hypertension     Past Surgical History:  Procedure Laterality Date   APPENDECTOMY     CESAREAN SECTION     CHOLECYSTECTOMY N/A 09/25/2021   Procedure: LAPAROSCOPIC CHOLECYSTECTOMY;  Surgeon: Oralee Billow, MD;  Location: [facility];  Service: General;  Laterality: N/A;   ERCP N/A 09/23/2021   Procedure: ENDOSCOPIC RETROGRADE CHOLANGIOPANCREATOGRAPHY (ERCP);  Surgeon: Alvis Jourdain, MD;  Location: [facility];  Service: Gastroenterology;  Laterality: N/A;   SPHINCTEROTOMY  09/23/2021   Procedure: SPHINCTEROTOMY;  Surgeon: Alvis Jourdain, MD;  [facility]: [facility];  Service: Gastroenterology;;   TUBAL LIGATION      Allergies  Allergen Reactions   Codeine Swelling and Other (See Comments)    Throat does not swell (body, hands, and mouth do, however)   Kiwi Extract Swelling, Rash and Other (See Comments)    Throat does not swell (body, hands, and mouth do, however)     Objective:  Physical Exam General: Alert and oriented x3 in no acute distress  Dermatology: Distant hyperkeratotic lesion(s) present on the plantar aspect of the left foot. Pain on palpation with a central nucleated core noted. Skin is warm, dry and supple bilateral lower extremities. Negative for open lesions or macerations.  Vascular: Palpable pedal pulses bilaterally. No edema or erythema noted. Capillary refill within normal limits.  Neurological: Grossly intact via light touch  Musculoskeletal Exam: There continues to be some pain on palpation at the keratotic lesion(s) noted. Range of motion within normal limits bilateral. Muscle  strength 5/5 in all groups bilateral.  Assessment: 1.  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 Dot Gazella, DPM    [address] [place], [place] [address]                Office [phone]  Fax [phone]

## 2024-01-20 ENCOUNTER — Ambulatory Visit: Admitting: Podiatry

## 2024-04-06 ENCOUNTER — Encounter: Payer: Self-pay | Admitting: Podiatry

## 2024-04-06 ENCOUNTER — Ambulatory Visit (INDEPENDENT_AMBULATORY_CARE_PROVIDER_SITE_OTHER): Admitting: Podiatry

## 2024-04-06 VITALS — Ht 67.0 in | Wt 221.6 lb

## 2024-04-06 DIAGNOSIS — R0989 Other specified symptoms and signs involving the circulatory and respiratory systems: Secondary | ICD-10-CM

## 2024-04-06 NOTE — Progress Notes (Signed)
   Chief Complaint  Patient presents with   Callouses    Pt is here due to bilateral callous on the bottom of her feet.    Subjective: 63 y.o. 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  Overall improvement.  Continued improvement  Past Medical History:  Diagnosis Date   Hypertension     Past Surgical History:  Procedure Laterality Date   APPENDECTOMY     CESAREAN SECTION     CHOLECYSTECTOMY N/A 09/25/2021   Procedure: LAPAROSCOPIC CHOLECYSTECTOMY;  Surgeon: Eletha Boas, MD;  Location: [facility];  Service: General;  Laterality: N/A;   ERCP N/A 09/23/2021   Procedure: ENDOSCOPIC RETROGRADE CHOLANGIOPANCREATOGRAPHY (ERCP);  Surgeon: Rollin Dover, MD;  Location: [facility];  Service: Gastroenterology;  Laterality: N/A;   SPHINCTEROTOMY  09/23/2021   Procedure: SPHINCTEROTOMY;  Surgeon: Rollin Dover, MD;  Location: [facility];  Service: Gastroenterology;;   TUBAL LIGATION      Allergies  Allergen Reactions   Codeine Swelling and Other (See Comments)    Throat does not swell (body, hands, and mouth do, however)   Kiwi Extract Swelling, Rash and Other (See Comments)    Throat does not swell (body, hands, and mouth do, however)     Objective:  Physical Exam General: Alert and oriented x3 in no acute distress  Dermatology: Calluses noted bilateral  No open wounds noted.  Vascular: Diminished PT and DP pulse left foot.  Delayed capillary refill.  The foot is cold compared to the contralateral limb  Neurological: Grossly intact via light touch  Musculoskeletal Exam: There continues to be some pain on palpation at the keratotic lesion(s) noted. Range of motion within normal limits bilateral. Muscle strength 5/5 in all groups bilateral.  Tenderness and pain with palpation throughout the left foot below the level of the ankle.  The extremity is cold compared to the contralateral limb.  Diminished  pulses.  Intermittent claudication throughout the day and at night when the feet are in an elevated position as well  Assessment: 1.  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  Concern for peripheral vascular disease left lower extremity  Plan of Care:  -Patient evaluated -Due to the cold extremity from the ankles down I did go ahead and order arterial ABIs bilateral lower extremities.  History of smoker 1/2 PPD x 20 yrs - Excisional debridement of the keratotic lesion was performed today using a 312 scalpel without incident or bleeding. -Recommend OTC corn and callus remover PRN -Recommend good supportive tennis shoes and sneakers.  Refrain from going barefoot -Return to clinic PRN.  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 *Worked part-time at [employer] over the summer  Thresa EMERSON Sar, DPM [facility]  Dr. Thresa EMERSON Sar, DPM    [address] [place], [place] [address]                Office [phone]  Fax [phone]

## 2024-04-08 ENCOUNTER — Telehealth: Payer: Self-pay | Admitting: Podiatry

## 2024-04-08 NOTE — Telephone Encounter (Signed)
 Patient is requesting Dr. Janit, send MRI order to [facility] in [place]. Patient contact telephone number, [phone]

## 2024-04-22 ENCOUNTER — Telehealth: Payer: Self-pay | Admitting: Lab

## 2024-04-22 NOTE — Telephone Encounter (Signed)
 Patient states was suppose to have urgent MRI and no one has called her it has now been two weeks can you review and advise she needs it to go to [facility].

## 2024-04-30 ENCOUNTER — Telehealth: Payer: Self-pay | Admitting: Podiatry

## 2024-04-30 NOTE — Addendum Note (Signed)
 Addended by: JOSHUA KIRSTEN on: 04/30/2024 11:14 AM   Modules accepted: Orders

## 2024-04-30 NOTE — Telephone Encounter (Signed)
 The patient is inquiring about the status of her referral to the cardiologist. She reports experiencing significant pain and states that she has been waiting for a call for over 3 weeks.  Please contact the patient with an update at your earliest convenience..  Thank you.

## 2024-05-05 ENCOUNTER — Other Ambulatory Visit: Payer: Self-pay | Admitting: Podiatry

## 2024-05-05 ENCOUNTER — Ambulatory Visit (HOSPITAL_COMMUNITY)
Admission: RE | Admit: 2024-05-05 | Discharge: 2024-05-05 | Disposition: A | Discharge status: Home / Self Care | Source: Ambulatory Visit | Attending: Podiatry | Admitting: Podiatry

## 2024-05-05 DIAGNOSIS — R0989 Other specified symptoms and signs involving the circulatory and respiratory systems: Secondary | ICD-10-CM | POA: Diagnosis not present

## 2024-05-05 LAB — VAS US ABI WITH/WO TBI
Left ABI: 0.29
Right ABI: 1.05

## 2024-06-03 ENCOUNTER — Encounter: Payer: Self-pay | Admitting: Vascular Surgery

## 2024-06-03 ENCOUNTER — Ambulatory Visit: Attending: Vascular Surgery | Admitting: Vascular Surgery

## 2024-06-03 VITALS — BP 131/80 | HR 77 | Temp 98.2°F | Resp 20 | Ht 67.0 in | Wt 234.7 lb

## 2024-06-03 DIAGNOSIS — I70212 Atherosclerosis of native arteries of extremities with intermittent claudication, left leg: Secondary | ICD-10-CM

## 2024-06-03 NOTE — Progress Notes (Signed)
 [facility] [place]  ASSESSMENT / PLAN: Cassandra Wilkinson is a 63 y.o. 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  Recommend:  Abstinence from all tobacco products. Blood glucose control with goal A1c < 7%. Blood pressure control with goal blood pressure < 130/80 mmHg. Lipid reduction therapy with goal LDL-C < 55 mg/dL. Aspirin 81mg  by mouth daily. Atorvastatin 40-80mg  PO QD (or other high intensity statin therapy).  Severe peripheral arterial disease by ankle-brachial index, but fairly mild symptoms.  Counseled patient to begin a walking regimen and stop smoking.  Will see her back in 3 months to evaluate progress with medical therapy.  CHIEF COMPLAINT: Left leg discomfort.  HISTORY OF PRESENT ILLNESS: Cassandra Wilkinson is a 63 y.o. female referred to clinic for evaluation of peripheral arterial disease.  The patient follows with Dr. Janit of podiatry.  He noted decreased pulse and referred her for noninvasive testing.  I reviewed her testing today and we talked a bit about her leg symptoms.  The patient reports numbness in the left foot.  She reports claudication type discomfort with walking.  This is a fairly short distance and somewhat bothersome to her.  This is not disabling.  She is able to work as a runner, broadcasting/film/video.  She does not have any rest pain symptoms she does not have any ulcer about her feet.   Past Medical History:  Diagnosis Date   Hypertension    Peripheral vascular disease     Past Surgical History:  Procedure Laterality Date   APPENDECTOMY     CESAREAN SECTION     CHOLECYSTECTOMY N/A 09/25/2021   Procedure: LAPAROSCOPIC CHOLECYSTECTOMY;  Surgeon: Eletha Boas, MD;  Location: [facility];  Service: General;  Laterality: N/A;   ERCP N/A 09/23/2021   Procedure: ENDOSCOPIC RETROGRADE CHOLANGIOPANCREATOGRAPHY (ERCP);  Surgeon: Rollin Dover, MD;  Location: [facility];  Service: Gastroenterology;   Laterality: N/A;   SPHINCTEROTOMY  09/23/2021   Procedure: SPHINCTEROTOMY;  Surgeon: Rollin Dover, MD;  Location: [facility];  Service: Gastroenterology;;   TUBAL LIGATION      Family History  Problem Relation Age of Onset   Heart failure Mother    Diabetes Mother    Cancer Father     Social History   Socioeconomic History   Marital status: Divorced    Spouse name: Not on file   Number of children: Not on file   Years of education: Not on file   Highest education level: Not on file  Occupational History   Not on file  Tobacco Use   Smoking status: Every Day    Current packs/day: 0.50    Types: Cigarettes   Smokeless tobacco: Never  Vaping Use   Vaping status: Never Used  Substance and Sexual Activity   Alcohol use: No   Drug use: No   Sexual activity: Never    Birth control/protection: None  Other Topics Concern   Not on file  Social History Narrative   Not on file   Social Drivers of Health   Financial Resource Strain: Not on file  Food Insecurity: Not on file  Transportation Needs: Not on file  Physical Activity: Not on file  Stress: Not on file  Social Connections: Unknown (07/17/2022)   [facility]   Social Network    Social Network: Not on file  Intimate Partner Violence: Unknown (07/17/2022)   Received from [facility]   HITS    Physically Hurt: Not on  file    Insult or Talk Down To: Not on file    Threaten Physical Harm: Not on file    Scream or Curse: Not on file    Allergies  Allergen Reactions   Codeine Swelling and Other (See Comments)    Throat does not swell (body, hands, and mouth do, however)   Kiwi Extract Swelling, Rash and Other (See Comments)    Throat does not swell (body, hands, and mouth do, however)    Current Outpatient Medications  Medication Sig Dispense Refill   amLODipine  (NORVASC ) 10 MG tablet Take 10 mg by mouth in the morning.     Aspirin-Salicylamide-Caffeine (BC HEADACHE POWDER PO) Take 1 packet  by mouth See admin instructions. Dissolve the contents of 1 packet in the mouth four to six times a day as needed for pain or headaches     fluticasone  (FLONASE ) 50 MCG/ACT nasal spray Place 2 sprays into both nostrils daily. 9.9 mL 0   lisinopril -hydrochlorothiazide  (ZESTORETIC ) 20-12.5 MG tablet Take 1 tablet by mouth daily. (Patient taking differently: Take 1 tablet by mouth See admin instructions. Take 1 tablet by mouth in the morning and at lunchtime) 30 tablet 0   No current facility-administered medications for this visit.    PHYSICAL EXAM Vitals:   06/03/24 0932  BP: 131/80  Pulse: 77  Resp: 20  Temp: 98.2 F (36.8 C)  TempSrc: Temporal  SpO2: 98%  Weight: 234 lb 11.2 oz (106.5 kg)  Height: 5' 7 (1.702 m)   No acute distress Regular rate and rhythm Unlabored breathing No palpable pedal pulse on the left 1+ right dorsalis pedis pulse  PERTINENT LABORATORY AND RADIOLOGIC DATA  Most recent CBC    Latest Ref Rng & Units 09/26/2021    5:05 AM 09/23/2021    4:38 AM 09/22/2021    7:18 AM  CBC  WBC 4.0 - 10.5 K/uL 10.3  5.1  5.0   Hemoglobin 12.0 - 15.0 g/dL 86.8  88.2  85.2   Hematocrit 36.0 - 46.0 % 39.9  36.2  44.4   Platelets 150 - 400 K/uL 227  234  273      Most recent CMP    Latest Ref Rng & Units 09/26/2021    5:05 AM 09/24/2021    4:50 AM 09/23/2021    4:38 AM  CMP  Glucose 70 - 99 mg/dL 896  897  99   BUN 6 - 20 mg/dL 11  9  9    Creatinine 0.44 - 1.00 mg/dL 9.33  9.40  9.36   Sodium 135 - 145 mmol/L 135  136  137   Potassium 3.5 - 5.1 mmol/L 4.4  3.7  2.8   Chloride 98 - 111 mmol/L 104  105  107   CO2 22 - 32 mmol/L 23  25  23    Calcium 8.9 - 10.3 mg/dL 9.1  8.5  8.3   Total Protein 6.5 - 8.1 g/dL 7.2  6.5  6.5   Total Bilirubin 0.3 - 1.2 mg/dL 0.8  0.7  0.8   Alkaline Phos 38 - 126 U/L 106  112  127   AST 15 - 41 U/L 45  59  120   ALT 0 - 44 U/L 105  150  212     Renal function CrCl cannot be calculated (Patient's most recent lab result is older than  the maximum 21 days allowed.).  No results found for: HGBA1C  LDL Cholesterol  Date Value Ref Range Status  01/06/2008 110 (H) 0 - 99 mg/dL Final    Comment:    See lab report for associated comment(s)     LOWER EXTREMITY DOPPLER STUDY   Patient Name:  Cassandra Wilkinson  Date of Exam:   05/05/2024  Medical Rec #: [id number]            Accession #:    [id number]  Date of Birth: 12-Oct-1960            Patient Gender: F  Patient Age:   59 years  Exam Location:  [address]  Procedure:      VAS US  ABI WITH/WO TBI  Referring Phys: BRENT EVANS    ---------------------------------------------------------------------------  -----    Indications: Claudication, and rest pain.   High Risk Factors: Hypertension.     Comparison Study: No prior study   Performing Technologist: Rosaline Fujisawa MHA, RDMS, RVT, RDCS     Examination Guidelines: A complete evaluation includes at minimum, Doppler  waveform signals and systolic blood pressure reading at the level of  bilateral  brachial, anterior tibial, and posterior tibial arteries, when vessel  segments  are accessible. Bilateral testing is considered an integral part of a  complete  examination. Photoelectric Plethysmograph (PPG) waveforms and toe systolic  pressure readings are included as required and additional duplex testing  as  needed. Limited examinations for reoccurring indications may be performed  as  noted.     ABI Findings:  +---------+------------------+-----+---------+--------+  Right   Rt Pressure (mmHg)IndexWaveform Comment   +---------+------------------+-----+---------+--------+  Brachial 143                                       +---------+------------------+-----+---------+--------+  ATA     132               0.85 triphasic          +---------+------------------+-----+---------+--------+  PTA     164               1.05 triphasic           +---------+------------------+-----+---------+--------+  Great Toe103               0.66                    +---------+------------------+-----+---------+--------+   +---------+------------------+-----+----------+-------+  Left    Lt Pressure (mmHg)IndexWaveform  Comment  +---------+------------------+-----+----------+-------+  Brachial 156                                       +---------+------------------+-----+----------+-------+  ATA     44                0.28 monophasic         +---------+------------------+-----+----------+-------+  PTA     46                0.29 monophasic         +---------+------------------+-----+----------+-------+  Great Toe0                 0.00                    +---------+------------------+-----+----------+-------+   +-------+-----------+-----------+------------+------------+  ABI/TBIToday's ABIToday's TBIPrevious ABIPrevious TBI  +-------+-----------+-----------+------------+------------+  Right 1.05       0.66                                 +-------+-----------+-----------+------------+------------+  Left  0.29       0.00                                 +-------+-----------+-----------+------------+------------+           Summary:  Right: Resting right ankle-brachial index is within normal range. The  right toe-brachial index is abnormal.  Right toe pressure is >60 mmHg which suggests adequate perfusion for  healing.    Left: Resting left ankle-brachial index indicates critical left limb  ischemia. The left toe-brachial index is abnormal. No discernable waveform  involving all five left digits.     *See table(s) above for measurements and observations.   Debby SAILOR. Magda, MD [facility] Phone Number: [phone] 06/03/2024 10:56 AM   Total time spent on preparing this encounter including chart review, data review, collecting  history, examining the patient, and coordinating care: 60 min  Portions of this report may have been transcribed using voice recognition software.  Every effort has been made to ensure accuracy; however, inadvertent computerized transcription errors may still be present.

## 2024-06-04 ENCOUNTER — Other Ambulatory Visit: Payer: Self-pay | Admitting: *Deleted

## 2024-06-04 DIAGNOSIS — I70212 Atherosclerosis of native arteries of extremities with intermittent claudication, left leg: Secondary | ICD-10-CM

## 2024-07-14 ENCOUNTER — Telehealth: Payer: Self-pay | Admitting: *Deleted

## 2024-07-14 NOTE — Telephone Encounter (Signed)
 Triage call Pt called complaint of not able to walk due to swelling and increase pain. Cold feet . Was able to reschedule pt for sooner appointment jan,14 for ultrasound and jan 15 with PA . Pt is to call if pain worsens or if further questions or concerns. Educated on elevating legs above the level of the heart.

## 2024-08-05 ENCOUNTER — Ambulatory Visit (HOSPITAL_BASED_OUTPATIENT_CLINIC_OR_DEPARTMENT_OTHER)
Admission: RE | Admit: 2024-08-05 | Discharge: 2024-08-05 | Disposition: A | Discharge status: Home / Self Care | Source: Ambulatory Visit | Attending: Surgery | Admitting: Surgery

## 2024-08-05 ENCOUNTER — Ambulatory Visit (HOSPITAL_COMMUNITY)
Admission: RE | Admit: 2024-08-05 | Discharge: 2024-08-05 | Disposition: A | Discharge status: Home / Self Care | Source: Ambulatory Visit | Attending: Surgery | Admitting: Surgery

## 2024-08-05 DIAGNOSIS — I70212 Atherosclerosis of native arteries of extremities with intermittent claudication, left leg: Secondary | ICD-10-CM | POA: Insufficient documentation

## 2024-08-05 LAB — VAS US ABI WITH/WO TBI
Left ABI: 0.41
Right ABI: 1

## 2024-08-06 ENCOUNTER — Ambulatory Visit: Admitting: Student-PharmD

## 2024-08-06 ENCOUNTER — Ambulatory Visit

## 2024-08-07 NOTE — Progress Notes (Cosign Needed Addendum)
 " [facility] Pharmacist Note  Name: Cassandra Wilkinson  MRN: [id number]  DOB: [birth year]  Sex: female PCP: [facility] CPP Referral Provider: Dr. Magda  HISTORY OF PRESENT ILLNESS: Cassandra Wilkinson is a 64 y.o. 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 Family history of diabetes and cardiovascular complications. Patient is motivated to improve health to heal properly. She was seen in our clinic today by [facility] for worsening claudication symptoms and plan is to proceed with angiography with Dr. Magda.   Dyslipidemia/ASCVD  Current lipid-lowering medications: none  Previously tried medications/intolerances: does not recall statin name but says PCP started her on a cholesterol medication in the past and she experienced nausea (unable to find in fill history)  Current antiplatelets/antithrombotics: currently not taking baby aspirin; takes [facility] powder (845 mg aspirin) about 6 times a day (~5 grams aspirin a day). Patient states that she is trying to cut back.   Rx affordability and access: [place] [employer]   Past Medical History:  Diagnosis Date   Hypertension    Peripheral vascular disease    Past Surgical History:  Procedure Laterality Date   APPENDECTOMY     CESAREAN SECTION     CHOLECYSTECTOMY N/A 09/25/2021   Procedure: LAPAROSCOPIC CHOLECYSTECTOMY;  Surgeon: Eletha Boas, MD;  Location: [facility];  Service: General;  Laterality: N/A;   ERCP N/A 09/23/2021   Procedure: ENDOSCOPIC RETROGRADE CHOLANGIOPANCREATOGRAPHY (ERCP);  Surgeon: Rollin Dover, MD;  Location: [facility];  Service: Gastroenterology;  Laterality: N/A;   SPHINCTEROTOMY  09/23/2021   Procedure: SPHINCTEROTOMY;  Surgeon: Rollin Dover, MD;  Location: [facility];  Service: Gastroenterology;;   TUBAL LIGATION     Family History  Problem Relation Age of Onset    Heart failure Mother    Diabetes Mother    Cancer Father    LABS: Lab Results  Component Value Date   CHOL 174 01/06/2008   HDL 40 01/06/2008   LDLCALC 110 (H) 01/06/2008   TRIG 122 01/06/2008   CHOLHDL 4.4 Ratio 01/06/2008    Lab Results  Component Value Date   CREATININE 0.66 09/26/2021   BUN 11 09/26/2021   NA 135 09/26/2021   K 4.4 09/26/2021   CL 104 09/26/2021   CO2 23 09/26/2021   CrCl cannot be calculated (Patient's most recent lab result is older than the maximum 21 days allowed.).      Component Value Date/Time   PROT 7.2 09/26/2021 0505   ALBUMIN 3.5 09/26/2021 0505   AST 45 (H) 09/26/2021 0505   ALT 105 (H) 09/26/2021 0505   ALKPHOS 106 09/26/2021 0505   BILITOT 0.8 09/26/2021 0505    No results found for: HGBA1C  ASSESSMENT & PLAN:  Dyslipidemia LDL above goal <55 mg/dL. Last LDL 96 mg/dL 0/7975. Plan to collect updated lipid panel today and baseline CMP. Will plan to initiate high intensity statin once updated lab work confirms safe for statin start.  History of possible mild intolerance to statin medication. Discussed that there are other statin medications we can try.  Counseled patient on treatment, including efficacy, dosing, administration, possible adverse effects, and anticipated cost.  Reviewed long-term complications of uncontrolled cholesterol.  Reviewed goals for cholesterol readings with patient.  Reviewed dietary and lifestyle modifications to improve cholesterol.  Repeat lipid panel in 4-12 weeks after medication changes.    Antiplatelets/Antithrombotics Patient with no recent revascularization but plan for  upcoming angiography in the next couple of weeks.  Currently takes excessive amounts of BC powder daily in which she is receiving ~5 grams of aspirin. Reviewed complications of continued use of BC powder and risks for GI bleed and CKD. Patient plans to cut back on consumption. Counseled patient on treatment, including efficacy, dosing,  administration, possible adverse effects, and anticipated cost.   Other Cardiovascular Risk Factors A1C last screened 10/2019 and was in normal range at 5.6%. In light of comorbidities and worsening PAD, will collect updated A1C today to screen for diabetes.  BP is slightly elevated today. Continue taking amlodipine  10 mg daily and lisinopril -hydrochlorothiazide  20-12.5 mg daily. Will discuss smoking cessation at future visits.   Recommend annual influenza and COVID vaccines.   Follow up: via telephone pending lab results  Jenkins Graces, PharmD PGY1 Pharmacy Resident  Lum Herald, PharmD, BCACP, CPP [facility] [phone] "

## 2024-08-10 ENCOUNTER — Ambulatory Visit: Admitting: Student-PharmD

## 2024-08-10 ENCOUNTER — Ambulatory Visit: Attending: Surgery | Admitting: Physician Assistant

## 2024-08-10 ENCOUNTER — Encounter: Payer: Self-pay | Admitting: Student-PharmD

## 2024-08-10 VITALS — BP 135/84 | Ht 67.0 in | Wt 230.3 lb

## 2024-08-10 VITALS — BP 135/84

## 2024-08-10 DIAGNOSIS — I70222 Atherosclerosis of native arteries of extremities with rest pain, left leg: Secondary | ICD-10-CM | POA: Diagnosis not present

## 2024-08-10 DIAGNOSIS — Z131 Encounter for screening for diabetes mellitus: Secondary | ICD-10-CM | POA: Insufficient documentation

## 2024-08-10 DIAGNOSIS — I70212 Atherosclerosis of native arteries of extremities with intermittent claudication, left leg: Secondary | ICD-10-CM | POA: Insufficient documentation

## 2024-08-10 DIAGNOSIS — I739 Peripheral vascular disease, unspecified: Secondary | ICD-10-CM | POA: Diagnosis present

## 2024-08-10 DIAGNOSIS — F17200 Nicotine dependence, unspecified, uncomplicated: Secondary | ICD-10-CM | POA: Diagnosis not present

## 2024-08-10 NOTE — Progress Notes (Signed)
 " Office Note     CC:  follow up Requesting Provider:  [facility]  HPI: Cassandra Wilkinson is a 64 y.o. (11-29-1960) female who presents for surveillance of PAD.  She was seen by Dr. Joesph in November of last year with claudication symptoms of the left calf.  She believes her left leg is more bothersome now than it was last year.  She complains of numbness in the left foot that she believes is worsening.  She also describes pain in the ankle and foot overnight which will occasionally wake her up.  She has callus formation on her great toe tip however no open wounds.  She smokes 8 cigarettes a day.  She does not take individual aspirin however does take several Goody powders a day.  She will also be seen by clinical pharmacy today to be initiated on statin therapy.  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   Past Medical History:  Diagnosis Date   Hypertension    Peripheral vascular disease     Past Surgical History:  Procedure Laterality Date   APPENDECTOMY     CESAREAN SECTION     CHOLECYSTECTOMY N/A 09/25/2021   Procedure: LAPAROSCOPIC CHOLECYSTECTOMY;  Surgeon: Cassandra Boas, MD;  Location: [facility];  Service: General;  Laterality: N/A;   ERCP N/A 09/23/2021   Procedure: ENDOSCOPIC RETROGRADE CHOLANGIOPANCREATOGRAPHY (ERCP);  Surgeon: Cassandra Dover, MD;  Location: [facility];  Service: Gastroenterology;  Laterality: N/A;   SPHINCTEROTOMY  09/23/2021   Procedure: SPHINCTEROTOMY;  Surgeon: Cassandra Dover, MD;  Location: [facility];  Service: Gastroenterology;;   TUBAL LIGATION      Social History   Socioeconomic History   Marital status: Divorced    Spouse name: Not on file   Number of children: Not on file   Years of education: Not on file   Highest education level: Not on file  Occupational History   Not on file  Tobacco Use   Smoking status: Every Day    Current packs/day: 0.50    Types: Cigarettes    Smokeless tobacco: Never  Vaping Use   Vaping status: Never Used  Substance and Sexual Activity   Alcohol use: No   Drug use: No   Sexual activity: Never    Birth control/protection: None  Other Topics Concern   Not on file  Social History Narrative   Not on file   Social Drivers of Health   Tobacco Use: High Risk (08/10/2024)   Patient History    Smoking Tobacco Use: Every Day    Smokeless Tobacco Use: Never    Passive Exposure: Not on file  Financial Resource Strain: Not on file  Food Insecurity: Not on file  Transportation Needs: Not on file  Physical Activity: Not on file  Stress: Not on file  Social Connections: Not on file  Intimate Partner Violence: Not on file  Depression (EYV7-0): Not on file  Alcohol Screen: Not on file  Housing: Not on file  Utilities: Not on file  Health Literacy: Not on file    Family History  Problem Relation Age of Onset   Heart failure Mother    Diabetes Mother    Cancer Father     Current Outpatient Medications  Medication Sig Dispense Refill   amLODipine  (NORVASC ) 10 MG tablet Take 10 mg by mouth in the morning.     Aspirin-Salicylamide-Caffeine (BC HEADACHE POWDER PO) Take 1 packet by mouth See admin instructions.  Dissolve the contents of 1 packet in the mouth four to six times a day as needed for pain or headaches     fluticasone  (FLONASE ) 50 MCG/ACT nasal spray Place 2 sprays into both nostrils daily. 9.9 mL 0   lisinopril -hydrochlorothiazide  (ZESTORETIC ) 20-12.5 MG tablet Take 1 tablet by mouth daily. (Patient taking differently: Take 1 tablet by mouth See admin instructions. Take 1 tablet by mouth in the morning and at lunchtime) 30 tablet 0   No current facility-administered medications for this visit.    Allergies[1]   REVIEW OF SYSTEMS:  Negative unless noted in HPI [X]  denotes positive finding, [ ]  denotes negative finding Cardiac  Comments:  Chest pain or chest pressure:    Shortness of breath upon exertion:     Short of breath when lying flat:    Irregular heart rhythm:        Vascular    Pain in calf, thigh, or hip brought on by ambulation:    Pain in feet at night that wakes you up from your sleep:     Blood clot in your veins:    Leg swelling:         Pulmonary    Oxygen at home:    Productive cough:     Wheezing:         Neurologic    Sudden weakness in arms or legs:     Sudden numbness in arms or legs:     Sudden onset of difficulty speaking or slurred speech:    Temporary loss of vision in one eye:     Problems with dizziness:         Gastrointestinal    Blood in stool:     Vomited blood:         Genitourinary    Burning when urinating:     Blood in urine:        Psychiatric    Major depression:         Hematologic    Bleeding problems:    Problems with blood clotting too easily:        Skin    Rashes or ulcers:        Constitutional    Fever or chills:      PHYSICAL EXAMINATION:  Vitals:   08/10/24 0920  BP: 135/84  Weight: 230 lb 4.8 oz (104.5 kg)  Height: 5' 7 (1.702 m)    General:  WDWN in NAD; vital signs documented above Gait: Not observed HENT: WNL, normocephalic Pulmonary: normal non-labored breathing Cardiac: regular HR Abdomen: soft, NT, no masses Skin: without rashes Vascular Exam/Pulses: Palpable femoral pulses bilaterally; soft monophasic left AT and PT by Doppler Extremities: Left great toe tip and medial great toe with callus formation; no open wounds Musculoskeletal: no muscle wasting or atrophy  Neurologic: A&O X 3 Psychiatric:  The pt has Normal affect.   Non-Invasive Vascular Imaging:   ABI/TBIToday's ABIToday's TBIPrevious ABIPrevious TBI  +-------+-----------+-----------+------------+------------+  Right 1          0.47       1.05        0.66          +-------+-----------+-----------+------------+------------+  Left  0.41       0          0.29        0              +-------+-----------+-----------+------------+--------   There is a bypass duplex on this patient's chart  suggesting she has history of a distal SFA to PT bypass however she has no surgical history of the left lower extremity    ASSESSMENT/PLAN:: 64 y.o. female who returns to clinic with worsening symptoms of the left lower extremity  Cassandra Wilkinson is a 64 year old female who returns to clinic for surveillance of PAD.  She has worsening claudication symptoms since last office visit and has also developed worsening numbness in the foot.  She also describes pain in her Ankle and foot that wakes her up occasionally at night.  She has a toe pressure of 0 and has callus formation on the great toe without open wounds.  These symptoms may represent worsening PAD with possible critical limb ischemia.  We will proceed with aortogram with left lower extremity runoff and possible femoral-popliteal intervention, possible tibial intervention.  This will be performed in the next couple weeks with Dr. Magda.  After our office visit today she will meet with our clinical pharmacist to hopefully initiate statin therapy.  Angiography was discussed in detail with the patient including risks and she is agreeable to proceed.   Cassandra Sender, PA-C [facility] [phone]  Clinic MD:   [facility]     [1]  Allergies Allergen Reactions   Codeine Swelling and Other (See Comments)    Throat does not swell (body, hands, and mouth do, however)   Kiwi Extract Swelling, Rash and Other (See Comments)    Throat does not swell (body, hands, and mouth do, however)   "

## 2024-08-10 NOTE — Patient Instructions (Addendum)
 Will follow up with lab results to discuss potential medications for cholesterol.

## 2024-08-10 NOTE — H&P (View-Only) (Signed)
 " Office Note     CC:  follow up Requesting Provider:  [facility]  HPI: Cassandra Wilkinson is a 64 y.o. (11-29-1960) female who presents for surveillance of PAD.  She was seen by Dr. Joesph in November of last year with claudication symptoms of the left calf.  She believes her left leg is more bothersome now than it was last year.  She complains of numbness in the left foot that she believes is worsening.  She also describes pain in the ankle and foot overnight which will occasionally wake her up.  She has callus formation on her great toe tip however no open wounds.  She smokes 8 cigarettes a day.  She does not take individual aspirin however does take several Goody powders a day.  She will also be seen by clinical pharmacy today to be initiated on statin therapy.  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   Past Medical History:  Diagnosis Date   Hypertension    Peripheral vascular disease     Past Surgical History:  Procedure Laterality Date   APPENDECTOMY     CESAREAN SECTION     CHOLECYSTECTOMY N/A 09/25/2021   Procedure: LAPAROSCOPIC CHOLECYSTECTOMY;  Surgeon: Eletha Boas, MD;  Location: [facility];  Service: General;  Laterality: N/A;   ERCP N/A 09/23/2021   Procedure: ENDOSCOPIC RETROGRADE CHOLANGIOPANCREATOGRAPHY (ERCP);  Surgeon: Rollin Dover, MD;  Location: [facility];  Service: Gastroenterology;  Laterality: N/A;   SPHINCTEROTOMY  09/23/2021   Procedure: SPHINCTEROTOMY;  Surgeon: Rollin Dover, MD;  Location: [facility];  Service: Gastroenterology;;   TUBAL LIGATION      Social History   Socioeconomic History   Marital status: Divorced    Spouse name: Not on file   Number of children: Not on file   Years of education: Not on file   Highest education level: Not on file  Occupational History   Not on file  Tobacco Use   Smoking status: Every Day    Current packs/day: 0.50    Types: Cigarettes    Smokeless tobacco: Never  Vaping Use   Vaping status: Never Used  Substance and Sexual Activity   Alcohol use: No   Drug use: No   Sexual activity: Never    Birth control/protection: None  Other Topics Concern   Not on file  Social History Narrative   Not on file   Social Drivers of Health   Tobacco Use: High Risk (08/10/2024)   Patient History    Smoking Tobacco Use: Every Day    Smokeless Tobacco Use: Never    Passive Exposure: Not on file  Financial Resource Strain: Not on file  Food Insecurity: Not on file  Transportation Needs: Not on file  Physical Activity: Not on file  Stress: Not on file  Social Connections: Not on file  Intimate Partner Violence: Not on file  Depression (EYV7-0): Not on file  Alcohol Screen: Not on file  Housing: Not on file  Utilities: Not on file  Health Literacy: Not on file    Family History  Problem Relation Age of Onset   Heart failure Mother    Diabetes Mother    Cancer Father     Current Outpatient Medications  Medication Sig Dispense Refill   amLODipine  (NORVASC ) 10 MG tablet Take 10 mg by mouth in the morning.     Aspirin-Salicylamide-Caffeine (BC HEADACHE POWDER PO) Take 1 packet by mouth See admin instructions.  Dissolve the contents of 1 packet in the mouth four to six times a day as needed for pain or headaches     fluticasone  (FLONASE ) 50 MCG/ACT nasal spray Place 2 sprays into both nostrils daily. 9.9 mL 0   lisinopril -hydrochlorothiazide  (ZESTORETIC ) 20-12.5 MG tablet Take 1 tablet by mouth daily. (Patient taking differently: Take 1 tablet by mouth See admin instructions. Take 1 tablet by mouth in the morning and at lunchtime) 30 tablet 0   No current facility-administered medications for this visit.    Allergies[1]   REVIEW OF SYSTEMS:  Negative unless noted in HPI [X]  denotes positive finding, [ ]  denotes negative finding Cardiac  Comments:  Chest pain or chest pressure:    Shortness of breath upon exertion:     Short of breath when lying flat:    Irregular heart rhythm:        Vascular    Pain in calf, thigh, or hip brought on by ambulation:    Pain in feet at night that wakes you up from your sleep:     Blood clot in your veins:    Leg swelling:         Pulmonary    Oxygen at home:    Productive cough:     Wheezing:         Neurologic    Sudden weakness in arms or legs:     Sudden numbness in arms or legs:     Sudden onset of difficulty speaking or slurred speech:    Temporary loss of vision in one eye:     Problems with dizziness:         Gastrointestinal    Blood in stool:     Vomited blood:         Genitourinary    Burning when urinating:     Blood in urine:        Psychiatric    Major depression:         Hematologic    Bleeding problems:    Problems with blood clotting too easily:        Skin    Rashes or ulcers:        Constitutional    Fever or chills:      PHYSICAL EXAMINATION:  Vitals:   08/10/24 0920  BP: 135/84  Weight: 230 lb 4.8 oz (104.5 kg)  Height: 5' 7 (1.702 m)    General:  WDWN in NAD; vital signs documented above Gait: Not observed HENT: WNL, normocephalic Pulmonary: normal non-labored breathing Cardiac: regular HR Abdomen: soft, NT, no masses Skin: without rashes Vascular Exam/Pulses: Palpable femoral pulses bilaterally; soft monophasic left AT and PT by Doppler Extremities: Left great toe tip and medial great toe with callus formation; no open wounds Musculoskeletal: no muscle wasting or atrophy  Neurologic: A&O X 3 Psychiatric:  The pt has Normal affect.   Non-Invasive Vascular Imaging:   ABI/TBIToday's ABIToday's TBIPrevious ABIPrevious TBI  +-------+-----------+-----------+------------+------------+  Right 1          0.47       1.05        0.66          +-------+-----------+-----------+------------+------------+  Left  0.41       0          0.29        0              +-------+-----------+-----------+------------+--------   There is a bypass duplex on this patient's chart  suggesting she has history of a distal SFA to PT bypass however she has no surgical history of the left lower extremity    ASSESSMENT/PLAN:: 64 y.o. female who returns to clinic with worsening symptoms of the left lower extremity  Cassandra Wilkinson is a 64 year old female who returns to clinic for surveillance of PAD.  She has worsening claudication symptoms since last office visit and has also developed worsening numbness in the foot.  She also describes pain in her Ankle and foot that wakes her up occasionally at night.  She has a toe pressure of 0 and has callus formation on the great toe without open wounds.  These symptoms may represent worsening PAD with possible critical limb ischemia.  We will proceed with aortogram with left lower extremity runoff and possible femoral-popliteal intervention, possible tibial intervention.  This will be performed in the next couple weeks with Dr. Magda.  After our office visit today she will meet with our clinical pharmacist to hopefully initiate statin therapy.  Angiography was discussed in detail with the patient including risks and she is agreeable to proceed.   Cassandra Sender, PA-C [facility] [phone]  Clinic MD:   [facility]     [1]  Allergies Allergen Reactions   Codeine Swelling and Other (See Comments)    Throat does not swell (body, hands, and mouth do, however)   Kiwi Extract Swelling, Rash and Other (See Comments)    Throat does not swell (body, hands, and mouth do, however)   "

## 2024-08-11 ENCOUNTER — Ambulatory Visit: Payer: Self-pay | Admitting: Student-PharmD

## 2024-08-11 ENCOUNTER — Other Ambulatory Visit: Payer: Self-pay

## 2024-08-11 DIAGNOSIS — I70212 Atherosclerosis of native arteries of extremities with intermittent claudication, left leg: Secondary | ICD-10-CM

## 2024-08-11 LAB — LIPID PANEL
Chol/HDL Ratio: 2.8 ratio (ref 0.0–4.4)
Cholesterol, Total: 145 mg/dL (ref 100–199)
HDL: 52 mg/dL
LDL Chol Calc (NIH): 78 mg/dL (ref 0–99)
Triglycerides: 78 mg/dL (ref 0–149)
VLDL Cholesterol Cal: 15 mg/dL (ref 5–40)

## 2024-08-11 LAB — COMPREHENSIVE METABOLIC PANEL WITH GFR
ALT: 12 IU/L (ref 0–32)
AST: 15 IU/L (ref 0–40)
Albumin: 4.2 g/dL (ref 3.9–4.9)
Alkaline Phosphatase: 88 IU/L (ref 49–135)
BUN/Creatinine Ratio: 18 (ref 12–28)
BUN: 14 mg/dL (ref 8–27)
Bilirubin Total: 0.2 mg/dL (ref 0.0–1.2)
CO2: 21 mmol/L (ref 20–29)
Calcium: 9.4 mg/dL (ref 8.7–10.3)
Chloride: 106 mmol/L (ref 96–106)
Creatinine, Ser: 0.79 mg/dL (ref 0.57–1.00)
Globulin, Total: 3.1 g/dL (ref 1.5–4.5)
Glucose: 87 mg/dL (ref 70–99)
Potassium: 4 mmol/L (ref 3.5–5.2)
Sodium: 144 mmol/L (ref 134–144)
Total Protein: 7.3 g/dL (ref 6.0–8.5)
eGFR: 84 mL/min/1.73

## 2024-08-11 LAB — HEMOGLOBIN A1C
Est. average glucose Bld gHb Est-mCnc: 117 mg/dL
Hgb A1c MFr Bld: 5.7 % — ABNORMAL HIGH (ref 4.8–5.6)

## 2024-08-11 LAB — LDL CHOLESTEROL, DIRECT: LDL Direct: 76 mg/dL (ref 0–99)

## 2024-08-11 MED ORDER — ROSUVASTATIN CALCIUM 20 MG PO TABS: 20.0000 mg | ORAL_TABLET | Freq: Every day | ORAL | 3 refills | Status: AC

## 2024-08-14 ENCOUNTER — Other Ambulatory Visit: Payer: Self-pay

## 2024-08-14 DIAGNOSIS — I70212 Atherosclerosis of native arteries of extremities with intermittent claudication, left leg: Secondary | ICD-10-CM

## 2024-08-21 ENCOUNTER — Other Ambulatory Visit (HOSPITAL_COMMUNITY): Payer: Self-pay

## 2024-08-21 ENCOUNTER — Encounter (HOSPITAL_COMMUNITY)
Admission: EM | Disposition: A | Discharge status: Home / Self Care | Payer: Self-pay | Source: Home / Self Care | Attending: Vascular Surgery

## 2024-08-21 ENCOUNTER — Other Ambulatory Visit: Payer: Self-pay

## 2024-08-21 ENCOUNTER — Ambulatory Visit (HOSPITAL_COMMUNITY)
Admission: EM | Admit: 2024-08-21 | Discharge: 2024-08-21 | Disposition: A | Discharge status: Home / Self Care | Attending: Vascular Surgery | Admitting: Vascular Surgery

## 2024-08-21 DIAGNOSIS — I70212 Atherosclerosis of native arteries of extremities with intermittent claudication, left leg: Secondary | ICD-10-CM

## 2024-08-21 LAB — POCT I-STAT, CHEM 8
BUN: 19 mg/dL (ref 8–23)
Calcium, Ion: 1.16 mmol/L (ref 1.15–1.40)
Chloride: 105 mmol/L (ref 98–111)
Creatinine, Ser: 0.8 mg/dL (ref 0.44–1.00)
Glucose, Bld: 87 mg/dL (ref 70–99)
HCT: 41 % (ref 36.0–46.0)
Hemoglobin: 13.9 g/dL (ref 12.0–15.0)
Potassium: 3.6 mmol/L (ref 3.5–5.1)
Sodium: 141 mmol/L (ref 135–145)
TCO2: 23 mmol/L (ref 22–32)

## 2024-08-21 LAB — POCT ACTIVATED CLOTTING TIME: Activated Clotting Time: 240 s

## 2024-08-21 MED ORDER — HEPARIN (PORCINE) IN NACL 1000-0.9 UT/500ML-% IV SOLN
INTRAVENOUS | Status: DC | PRN
  Administered 2024-08-21: 1000 mL via SURGICAL_CAVITY

## 2024-08-21 MED ORDER — CLOPIDOGREL BISULFATE 75 MG PO TABS
75.0000 mg | ORAL_TABLET | Freq: Every day | ORAL | 11 refills | Status: AC
  Filled 2024-08-21: qty 30, 30d supply, fill #0

## 2024-08-21 MED ORDER — PROTAMINE SULFATE 10 MG/ML IV SOLN
INTRAVENOUS | Status: AC
  Filled 2024-08-21: qty 5

## 2024-08-21 MED ORDER — LIDOCAINE HCL (PF) 1 % IJ SOLN
INTRAMUSCULAR | Status: AC
  Filled 2024-08-21: qty 30

## 2024-08-21 MED ORDER — HYDRALAZINE HCL 20 MG/ML IJ SOLN: 5.0000 mg | INTRAMUSCULAR | Status: DC | PRN

## 2024-08-21 MED ORDER — ONDANSETRON HCL 4 MG/2ML IJ SOLN: 4.0000 mg | Freq: Four times a day (QID) | INTRAMUSCULAR | Status: DC | PRN

## 2024-08-21 MED ORDER — FENTANYL CITRATE (PF) 100 MCG/2ML IJ SOLN
INTRAMUSCULAR | Status: DC | PRN
  Administered 2024-08-21: 50 ug via INTRAVENOUS

## 2024-08-21 MED ORDER — LIDOCAINE HCL (PF) 1 % IJ SOLN
INTRAMUSCULAR | Status: DC | PRN
  Administered 2024-08-21: 15 mL

## 2024-08-21 MED ORDER — CLOPIDOGREL BISULFATE 300 MG PO TABS
ORAL_TABLET | ORAL | Status: AC
  Filled 2024-08-21: qty 1

## 2024-08-21 MED ORDER — IODIXANOL 320 MG/ML IV SOLN
INTRAVENOUS | Status: DC | PRN
  Administered 2024-08-21: 50 mL via INTRA_ARTERIAL

## 2024-08-21 MED ORDER — CLOPIDOGREL BISULFATE 300 MG PO TABS
ORAL_TABLET | ORAL | Status: DC | PRN
  Administered 2024-08-21: 300 mg via ORAL

## 2024-08-21 MED ORDER — FENTANYL CITRATE (PF) 100 MCG/2ML IJ SOLN
INTRAMUSCULAR | Status: AC
  Filled 2024-08-21: qty 2

## 2024-08-21 MED ORDER — SODIUM CHLORIDE 0.9 % IV SOLN: INTRAVENOUS | Status: DC

## 2024-08-21 MED ORDER — PROTAMINE SULFATE 10 MG/ML IV SOLN
INTRAVENOUS | Status: DC | PRN
  Administered 2024-08-21: 50 mg via INTRAVENOUS

## 2024-08-21 MED ORDER — SODIUM CHLORIDE 0.9 % WEIGHT BASED INFUSION: 1.0000 mL/kg/h | INTRAVENOUS | Status: DC

## 2024-08-21 MED ORDER — SODIUM CHLORIDE 0.9 % IV SOLN: 250.0000 mL | INTRAVENOUS | Status: DC | PRN

## 2024-08-21 MED ORDER — SODIUM CHLORIDE 0.9% FLUSH: 3.0000 mL | INTRAVENOUS | Status: DC | PRN

## 2024-08-21 MED ORDER — HEPARIN SODIUM (PORCINE) 1000 UNIT/ML IJ SOLN
INTRAMUSCULAR | Status: DC | PRN
  Administered 2024-08-21: 10000 [IU] via INTRAVENOUS

## 2024-08-21 MED ORDER — ASPIRIN 81 MG PO TBEC
81.0000 mg | DELAYED_RELEASE_TABLET | Freq: Every day | ORAL | 2 refills | Status: AC
  Filled 2024-08-21: qty 150, 150d supply, fill #0

## 2024-08-21 MED ORDER — MIDAZOLAM HCL (PF) 2 MG/2ML IJ SOLN
INTRAMUSCULAR | Status: DC | PRN
  Administered 2024-08-21: 1 mg via INTRAVENOUS

## 2024-08-21 MED ORDER — ASPIRIN 81 MG PO CHEW
CHEWABLE_TABLET | ORAL | Status: AC
  Filled 2024-08-21: qty 1

## 2024-08-21 MED ORDER — HEPARIN SODIUM (PORCINE) 1000 UNIT/ML IJ SOLN
INTRAMUSCULAR | Status: AC
  Filled 2024-08-21: qty 10

## 2024-08-21 MED ORDER — CLOPIDOGREL BISULFATE 75 MG PO TABS: 75.0000 mg | ORAL_TABLET | Freq: Every day | ORAL | Status: DC

## 2024-08-21 MED ORDER — HEPARIN (PORCINE) IN NACL 2000-0.9 UNIT/L-% IV SOLN
INTRAVENOUS | Status: DC | PRN
  Administered 2024-08-21: 1000 mL

## 2024-08-21 MED ORDER — MIDAZOLAM HCL 2 MG/2ML IJ SOLN
INTRAMUSCULAR | Status: AC
  Filled 2024-08-21: qty 2

## 2024-08-21 MED ORDER — CLOPIDOGREL BISULFATE 75 MG PO TABS: 300.0000 mg | ORAL_TABLET | Freq: Once | ORAL | Status: DC

## 2024-08-21 MED ORDER — LABETALOL HCL 5 MG/ML IV SOLN: 10.0000 mg | INTRAVENOUS | Status: DC | PRN

## 2024-08-21 MED ORDER — ASPIRIN 81 MG PO CHEW
CHEWABLE_TABLET | ORAL | Status: DC | PRN
  Administered 2024-08-21: 81 mg via ORAL

## 2024-08-21 MED ORDER — SODIUM CHLORIDE 0.9% FLUSH: 3.0000 mL | Freq: Two times a day (BID) | INTRAVENOUS | Status: DC

## 2024-08-21 MED ORDER — ACETAMINOPHEN 325 MG PO TABS: 650.0000 mg | ORAL_TABLET | ORAL | Status: DC | PRN

## 2024-08-21 NOTE — Op Note (Signed)
 DATE OF SERVICE: 08/21/2024  PATIENT:  Cassandra Wilkinson  64 y.o. 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 Magda, MD  ASSISTANT: none  ANESTHESIA:   local and IV sedation  ESTIMATED BLOOD LOSS: min  LOCAL MEDICATIONS USED:  LIDOCAINE    COUNTS: confirmed correct.  PATIENT DISPOSITION:  PACU - hemodynamically stable.   Delay start of Pharmacological VTE agent (>24hrs) due to surgical blood loss or risk of bleeding: no  INDICATION FOR PROCEDURE: Cassandra Wilkinson is a 64 y.o. female with left lower extremity claudication. After careful discussion of risks, benefits, and alternatives the patient was offered angiogram. The patient understood and wished to proceed.  OPERATIVE FINDINGS:   Aortogram: Renal arteries Left: patent  Right: patent Infrarenal aorta: patent Common iliac arteries: Left: patent  Right: patent Internal iliac arteries: Left: patent  Right: patenet External iliac arteries: Left: patent  Right: patent  Left Lower Extremity Angiogram:  Common femoral artery: Patent  Profunda femoris artery: patent  Superficial femoral artery: patent  Popliteal artery: occluded at Hunter's canal. Reconstitutes behind the knee. Below knee artery smaller.  Anterior tibial artery: patent  Tibioperoneal trunk: patent  Peroneal artery: patent  Posterior tibial artery: patent  Pedal circulation: patent  GLASS score. FP: 3. IP: 0  WIfI score. N/A  DESCRIPTION OF PROCEDURE:  After identification of the patient in the pre-operative holding area, the patient was transferred to the operating room. The patient was positioned supine on the operating room table. Anesthesia was induced. The groins was prepped and draped in standard fashion. A surgical pause was performed confirming correct patient, procedure, and operative location.  The right groin was anesthetized with subcutaneous injection of 1% lidocaine . Using ultrasound guidance, the right common femoral artery was accessed with micropuncture technique.  Fluoroscopy was used to confirm cannulation over the femoral head. The 22F micropuncture sheath was upsized to 68F.   A Benson wire was advanced into the distal aorta. Over the wire an omni flush catheter was advanced to the level of L2. Aortogram was performed - see above for details.   The left common iliac artery was selected with an omniflush catheter and glidwire advantage guidewire. The wire was advanced into the common femoral artery. Over the wire the omni flush catheter was advanced into the external iliac artery. Selective angiography was performed - see above for details.   The decision was made to intervene. The patient was heparinized with 10,000 units of heparin . The 68F sheath was exchanged for a 53F x 45cm sheath. Selective angiography of the left lower extremity performed prior to intervention. Catheter selective angiogram was performed after crossing the lesion from the popliteal artery.  The lesions were treated with: left femoropopliteal angioplasty and stenting (6x177mm Eluvia)  Completion angiography revealed:  Resolution of L femoropopliteal occlusion and inline flow to the foot  A perclose was used to close the arteriotomy. Hemostasis was excellent upon completion.   Conscious sedation was administered with the use of IV fentanyl  and midazolam  under continuous physician and nurse monitoring.  Heart rate, blood pressure, and oxygen  saturation were  continuously monitored.  Total sedation time was 30 minutes  Upon completion of the case instrument and sharps counts were confirmed correct. The patient was transferred to the PACU in good condition. I was present for all portions of the procedure.  PLAN: ASA / Plavix  / Statin. Follow up in 4 weeks with ABI and LLE arterial duplex.   Debby SAILOR. Magda, MD [facility] Phone Number: [phone] 08/21/2024 12:45 PM

## 2024-08-21 NOTE — Interval H&P Note (Signed)
 History and Physical Interval Note:  08/21/2024 12:44 PM  Cassandra Wilkinson  has presented today for surgery, with the diagnosis of atherosclerosis left lower extremity.  The various methods of treatment have been discussed with the patient and family. After consideration of risks, benefits and other options for treatment, the patient has consented to  Procedures: ABDOMINAL AORTOGRAM W/LOWER EXTREMITY (N/A) LOWER EXTREMITY INTERVENTION (N/A) as a surgical intervention.  The patient's history has been reviewed, patient examined, no change in status, stable for surgery.  I have reviewed the patient's chart and labs.  Questions were answered to the patient's satisfaction.     Debby LOISE Robertson

## 2024-08-22 ENCOUNTER — Encounter (HOSPITAL_COMMUNITY): Payer: Self-pay | Admitting: Vascular Surgery

## 2024-08-27 ENCOUNTER — Other Ambulatory Visit: Payer: Self-pay

## 2024-08-27 DIAGNOSIS — I739 Peripheral vascular disease, unspecified: Secondary | ICD-10-CM

## 2024-08-27 NOTE — Progress Notes (Unsigned)
 " Office Note     CC:  follow up Requesting Provider:  Baird Comer GAILS, NP  HPI: Cassandra Wilkinson is a 64 y.o. ([birth year]) female who presents as a triage work in for increased swelling and pain. She recently underwent Aortogram, arteriogram of BLE with complex angioplasty and stenting of the SFA/popliteal by Dr. Magda. This was for disabling claudication. She was discharged on Aspirin , Plavix , Statin.  Today she reports ***   Past Medical History:  Diagnosis Date   Hypertension    Peripheral vascular disease     Past Surgical History:  Procedure Laterality Date   ABDOMINAL AORTOGRAM W/LOWER EXTREMITY N/A 08/21/2024   Procedure: ABDOMINAL AORTOGRAM W/LOWER EXTREMITY;  Surgeon: Magda Debby SAILOR, MD;  Location: [facility];  Service: Cardiovascular;  Laterality: N/A;   APPENDECTOMY     CESAREAN SECTION     CHOLECYSTECTOMY N/A 09/25/2021   Procedure: LAPAROSCOPIC CHOLECYSTECTOMY;  Surgeon: Eletha Boas, MD;  Location: [facility];  Service: General;  Laterality: N/A;   ERCP N/A 09/23/2021   Procedure: ENDOSCOPIC RETROGRADE CHOLANGIOPANCREATOGRAPHY (ERCP);  Surgeon: Rollin Dover, MD;  Location: [facility];  Service: Gastroenterology;  Laterality: N/A;   LOWER EXTREMITY INTERVENTION N/A 08/21/2024   Procedure: LOWER EXTREMITY INTERVENTION;  Surgeon: Magda Debby SAILOR, MD;  Location: [facility];  Service: Cardiovascular;  Laterality: N/A;   SPHINCTEROTOMY  09/23/2021   Procedure: SPHINCTEROTOMY;  Surgeon: Rollin Dover, MD;  Location: [facility];  Service: Gastroenterology;;   TUBAL LIGATION      Social History   Socioeconomic History   Marital status: Divorced    Spouse name: Not on file   Number of children: Not on file   Years of education: Not on file   Highest education level: Not on file  Occupational History   Not on file  Tobacco Use   Smoking status: Every Day    Current packs/day: 0.50    Types: Cigarettes   Smokeless tobacco: Never  Vaping Use    Vaping status: Never Used  Substance and Sexual Activity   Alcohol use: No   Drug use: No   Sexual activity: Never    Birth control/protection: None  Other Topics Concern   Not on file  Social History Narrative   Not on file   Social Drivers of Health   Tobacco Use: High Risk (08/10/2024)   Patient History    Smoking Tobacco Use: Every Day    Smokeless Tobacco Use: Never    Passive Exposure: Not on file  Financial Resource Strain: Not on file  Food Insecurity: Not on file  Transportation Needs: Not on file  Physical Activity: Not on file  Stress: Not on file  Social Connections: Not on file  Intimate Partner Violence: Not on file  Depression (EYV7-0): Not on file  Alcohol Screen: Not on file  Housing: Not on file  Utilities: Not on file  Health Literacy: Not on file   *** Family History  Problem Relation Age of Onset   Heart failure Mother    Diabetes Mother    Cancer Father     Current Outpatient Medications  Medication Sig Dispense Refill   amLODipine  (NORVASC ) 10 MG tablet Take 10 mg by mouth in the morning.     aspirin  EC 81 MG tablet Take 1 tablet (81 mg total) by mouth daily. Swallow whole. 150 tablet 2   Aspirin -Salicylamide-Caffeine (BC HEADACHE POWDER PO) Take 1 packet by mouth See admin instructions. Dissolve the contents of 1 packet in  the mouth four to six times a day as needed for pain or headaches     clopidogrel  (PLAVIX ) 75 MG tablet Take 1 tablet (75 mg total) by mouth daily. 30 tablet 11   lisinopril -hydrochlorothiazide  (ZESTORETIC ) 20-12.5 MG tablet Take 1 tablet by mouth daily. (Patient taking differently: Take 2 tablets by mouth daily.) 30 tablet 0   meloxicam (MOBIC) 15 MG tablet Take 15 mg by mouth daily as needed (Knee pain).     rosuvastatin  (CRESTOR ) 20 MG tablet Take 1 tablet (20 mg total) by mouth daily. 90 tablet 3   No current facility-administered medications for this visit.    Allergies[1]   REVIEW OF SYSTEMS:  Negative unless  noted in HPI [X]  denotes positive finding, [ ]  denotes negative finding Cardiac  Comments:  Chest pain or chest pressure:    Shortness of breath upon exertion:    Short of breath when lying flat:    Irregular heart rhythm:        Vascular    Pain in calf, thigh, or hip brought on by ambulation:    Pain in feet at night that wakes you up from your sleep:     Blood clot in your veins:    Leg swelling:         Pulmonary    Oxygen at home:    Productive cough:     Wheezing:         Neurologic    Sudden weakness in arms or legs:     Sudden numbness in arms or legs:     Sudden onset of difficulty speaking or slurred speech:    Temporary loss of vision in one eye:     Problems with dizziness:         Gastrointestinal    Blood in stool:     Vomited blood:         Genitourinary    Burning when urinating:     Blood in urine:        Psychiatric    Major depression:         Hematologic    Bleeding problems:    Problems with blood clotting too easily:        Skin    Rashes or ulcers:        Constitutional    Fever or chills:      PHYSICAL EXAMINATION:  There were no vitals filed for this visit.***  General:  WDWN in NAD; vital signs documented above Gait: Not observed HENT: WNL, normocephalic Pulmonary: normal non-labored breathing Cardiac: {Desc; regular/irreg:14544} HR Abdomen: soft, NT, no masses Skin: {With/Without:20273} rashes Vascular Exam/Pulses: *** Extremities: {With/Without:20273} ischemic changes, {With/Without:20273} Gangrene , {With/Without:20273} cellulitis; {With/Without:20273} open wounds;  Musculoskeletal: no muscle wasting or atrophy  Neurologic: A&O X 3 Psychiatric:  The pt has Normal affect.   Non-Invasive Vascular Imaging:       ASSESSMENT/PLAN:: 63 y.o. female here for follow up for ***   -***   Teretha Damme, PA-C [facility] [phone]  Clinic MD:   [facility]     [1]  Allergies Allergen Reactions    Coconut (Cocos Nucifera) Hives   Codeine Swelling and Other (See Comments)    Throat does not swell (body, hands, and mouth do, however) Daughter is allergic, she dose not because of daughter   Venus Extract Swelling, Rash and Other (See Comments)    Throat does not swell (body, hands, and mouth do, however)   "

## 2024-08-28 ENCOUNTER — Telehealth: Payer: Self-pay

## 2024-08-28 ENCOUNTER — Ambulatory Visit (HOSPITAL_COMMUNITY): Admission: RE | Admit: 2024-08-28

## 2024-08-28 ENCOUNTER — Ambulatory Visit (HOSPITAL_COMMUNITY): Admission: RE | Admit: 2024-08-28 | Source: Ambulatory Visit

## 2024-08-28 DIAGNOSIS — I739 Peripheral vascular disease, unspecified: Secondary | ICD-10-CM

## 2024-08-28 LAB — VAS US ABI WITH/WO TBI
Left ABI: 0.88
Right ABI: 1.04

## 2024-08-28 NOTE — Telephone Encounter (Signed)
 On 08/27/24 the patient was scheduled for US  08/28/24 and PA on 08/31/24. The patient accepted these appointments. Today, after the US  appointments the patient requested to speak to someone regarding her 08/31/24 appointment.   The patient expressed that she should not have to wait until 08/31/24 to see a provider. Writer explained that there are no provider appointments available today and recommended Urgent Care or ED.

## 2024-08-31 ENCOUNTER — Ambulatory Visit

## 2024-09-08 ENCOUNTER — Ambulatory Visit: Admitting: Vascular Surgery

## 2024-09-08 ENCOUNTER — Ambulatory Visit (HOSPITAL_COMMUNITY)

## 2024-09-14 ENCOUNTER — Encounter: Admitting: Student-PharmD

## 2024-09-15 ENCOUNTER — Encounter: Admitting: Student-PharmD
